# Patient Record
Sex: Male | Born: 1955 | Race: White | Hispanic: No | Marital: Married | Smoking: Former smoker
Health system: Southern US, Community
[De-identification: ages and names within clinical notes are randomized; demographics above are authoritative.]

## PROBLEM LIST (undated history)

## (undated) DIAGNOSIS — Z87442 Personal history of urinary calculi: Secondary | ICD-10-CM

## (undated) DIAGNOSIS — I471 Supraventricular tachycardia, unspecified: Secondary | ICD-10-CM

## (undated) DIAGNOSIS — R519 Headache, unspecified: Secondary | ICD-10-CM

## (undated) DIAGNOSIS — Z8719 Personal history of other diseases of the digestive system: Secondary | ICD-10-CM

## (undated) DIAGNOSIS — M069 Rheumatoid arthritis, unspecified: Secondary | ICD-10-CM

## (undated) DIAGNOSIS — K219 Gastro-esophageal reflux disease without esophagitis: Secondary | ICD-10-CM

## (undated) DIAGNOSIS — I10 Essential (primary) hypertension: Secondary | ICD-10-CM

## (undated) DIAGNOSIS — I712 Thoracic aortic aneurysm, without rupture: Secondary | ICD-10-CM

## (undated) DIAGNOSIS — I7121 Aneurysm of the ascending aorta, without rupture: Secondary | ICD-10-CM

## (undated) DIAGNOSIS — Z96651 Presence of right artificial knee joint: Secondary | ICD-10-CM

## (undated) DIAGNOSIS — C801 Malignant (primary) neoplasm, unspecified: Secondary | ICD-10-CM

## (undated) HISTORY — PX: CARDIAC CATHETERIZATION: SHX172

## (undated) HISTORY — PX: STRABISMUS SURGERY: SHX218

## (undated) HISTORY — PX: TONSILLECTOMY: SUR1361

## (undated) HISTORY — PX: CYSTOSCOPY W/ URETEROSCOPY W/ LITHOTRIPSY: SUR380

---

## 2008-08-10 HISTORY — PX: OTHER SURGICAL HISTORY: SHX169

## 2012-10-08 HISTORY — PX: CATARACT EXTRACTION W/ INTRAOCULAR LENS IMPLANT: SHX1309

## 2015-01-09 HISTORY — PX: KNEE ARTHROSCOPY W/ MENISCECTOMY: SHX1879

## 2015-06-11 DIAGNOSIS — I712 Thoracic aortic aneurysm, without rupture: Secondary | ICD-10-CM

## 2015-06-11 DIAGNOSIS — I7121 Aneurysm of the ascending aorta, without rupture: Secondary | ICD-10-CM | POA: Insufficient documentation

## 2015-06-11 DIAGNOSIS — I10 Essential (primary) hypertension: Secondary | ICD-10-CM

## 2015-06-11 DIAGNOSIS — R002 Palpitations: Secondary | ICD-10-CM | POA: Insufficient documentation

## 2015-06-11 HISTORY — DX: Aneurysm of the ascending aorta, without rupture: I71.21

## 2015-06-11 HISTORY — DX: Palpitations: R00.2

## 2015-06-11 HISTORY — DX: Essential (primary) hypertension: I10

## 2015-06-11 HISTORY — DX: Thoracic aortic aneurysm, without rupture: I71.2

## 2015-06-20 HISTORY — PX: REPLACEMENT UNICONDYLAR JOINT KNEE: SUR1227

## 2015-08-06 ENCOUNTER — Encounter (HOSPITAL_BASED_OUTPATIENT_CLINIC_OR_DEPARTMENT_OTHER): Payer: Self-pay | Admitting: *Deleted

## 2015-08-06 NOTE — Progress Notes (Signed)
NPO AFTER MN WITH EXCEPTION CLEAR LIQUIDS UNTIL 0700 (NO CREAM/ MILK PRODUCTS).  ARRIVE AT 1115.  NEEDS ISTAT AND EKG. WILL TAKE GABAPENTIN AND ATENOLOL AM DOS W/ SIPS OF WATER AND IF NEEDED TAKE NORCO.

## 2015-08-08 ENCOUNTER — Encounter (HOSPITAL_BASED_OUTPATIENT_CLINIC_OR_DEPARTMENT_OTHER): Payer: Self-pay | Admitting: *Deleted

## 2015-08-08 ENCOUNTER — Ambulatory Visit (HOSPITAL_BASED_OUTPATIENT_CLINIC_OR_DEPARTMENT_OTHER): Payer: Managed Care, Other (non HMO) | Admitting: Anesthesiology

## 2015-08-08 ENCOUNTER — Encounter (HOSPITAL_BASED_OUTPATIENT_CLINIC_OR_DEPARTMENT_OTHER)
Admission: RE | Disposition: A | Discharge status: Home / Self Care | Payer: Self-pay | Source: Ambulatory Visit | Attending: Orthopedic Surgery

## 2015-08-08 ENCOUNTER — Ambulatory Visit (HOSPITAL_BASED_OUTPATIENT_CLINIC_OR_DEPARTMENT_OTHER)
Admission: RE | Admit: 2015-08-08 | Discharge: 2015-08-08 | Disposition: A | Discharge status: Home / Self Care | Payer: Managed Care, Other (non HMO) | Source: Ambulatory Visit | Attending: Orthopedic Surgery | Admitting: Orthopedic Surgery

## 2015-08-08 DIAGNOSIS — T84022A Instability of internal right knee prosthesis, initial encounter: Secondary | ICD-10-CM | POA: Insufficient documentation

## 2015-08-08 DIAGNOSIS — Z96651 Presence of right artificial knee joint: Secondary | ICD-10-CM

## 2015-08-08 DIAGNOSIS — M069 Rheumatoid arthritis, unspecified: Secondary | ICD-10-CM | POA: Diagnosis not present

## 2015-08-08 DIAGNOSIS — Z885 Allergy status to narcotic agent status: Secondary | ICD-10-CM | POA: Diagnosis not present

## 2015-08-08 DIAGNOSIS — I10 Essential (primary) hypertension: Secondary | ICD-10-CM | POA: Insufficient documentation

## 2015-08-08 DIAGNOSIS — Y792 Prosthetic and other implants, materials and accessory orthopedic devices associated with adverse incidents: Secondary | ICD-10-CM | POA: Diagnosis not present

## 2015-08-08 DIAGNOSIS — Z87891 Personal history of nicotine dependence: Secondary | ICD-10-CM | POA: Diagnosis not present

## 2015-08-08 HISTORY — DX: Rheumatoid arthritis, unspecified: M06.9

## 2015-08-08 HISTORY — DX: Presence of right artificial knee joint: Z96.651

## 2015-08-08 HISTORY — PX: PARTIAL KNEE ARTHROPLASTY: SHX2174

## 2015-08-08 HISTORY — DX: Essential (primary) hypertension: I10

## 2015-08-08 LAB — POCT I-STAT, CHEM 8
BUN: 18 mg/dL (ref 6–20)
CREATININE: 0.9 mg/dL (ref 0.61–1.24)
Calcium, Ion: 1.12 mmol/L (ref 1.12–1.23)
Chloride: 100 mmol/L — ABNORMAL LOW (ref 101–111)
Glucose, Bld: 83 mg/dL (ref 65–99)
HEMATOCRIT: 41 % (ref 39.0–52.0)
HEMOGLOBIN: 13.9 g/dL (ref 13.0–17.0)
POTASSIUM: 3.8 mmol/L (ref 3.5–5.1)
SODIUM: 139 mmol/L (ref 135–145)
TCO2: 28 mmol/L (ref 0–100)

## 2015-08-08 SURGERY — ARTHROPLASTY, KNEE, UNICOMPARTMENTAL
Anesthesia: General | Site: Knee | Laterality: Right

## 2015-08-08 MED ORDER — MIDAZOLAM HCL 2 MG/2ML IJ SOLN
INTRAMUSCULAR | Status: AC
  Filled 2015-08-08: qty 2

## 2015-08-08 MED ORDER — LACTATED RINGERS IV SOLN
INTRAVENOUS | Status: DC
Start: 2015-08-08 — End: 2015-08-08
  Administered 2015-08-08 (×2): via INTRAVENOUS
  Filled 2015-08-08: qty 1000

## 2015-08-08 MED ORDER — LIDOCAINE HCL (CARDIAC) 20 MG/ML IV SOLN
INTRAVENOUS | Status: DC | PRN
  Administered 2015-08-08: 100 mg via INTRAVENOUS

## 2015-08-08 MED ORDER — METHOCARBAMOL 500 MG PO TABS: 500.0000 mg | ORAL_TABLET | Freq: Four times a day (QID) | ORAL | Status: DC | PRN

## 2015-08-08 MED ORDER — ONDANSETRON HCL 4 MG/2ML IJ SOLN
INTRAMUSCULAR | Status: AC
  Filled 2015-08-08: qty 2

## 2015-08-08 MED ORDER — FENTANYL CITRATE (PF) 100 MCG/2ML IJ SOLN
INTRAMUSCULAR | Status: AC
  Filled 2015-08-08: qty 2

## 2015-08-08 MED ORDER — CEFAZOLIN SODIUM-DEXTROSE 2-3 GM-% IV SOLR
2.0000 g | INTRAVENOUS | Status: AC
  Administered 2015-08-08: 2 g via INTRAVENOUS
  Filled 2015-08-08: qty 50

## 2015-08-08 MED ORDER — KETOROLAC TROMETHAMINE 30 MG/ML IJ SOLN
INTRAMUSCULAR | Status: DC | PRN
  Administered 2015-08-08: 30 mg via INTRAVENOUS

## 2015-08-08 MED ORDER — SODIUM CHLORIDE 0.9 % IR SOLN
Status: DC | PRN
  Administered 2015-08-08: 500 mL

## 2015-08-08 MED ORDER — LIDOCAINE-PRILOCAINE 2.5-2.5 % EX CREA
TOPICAL_CREAM | CUTANEOUS | Status: AC
  Filled 2015-08-08: qty 5

## 2015-08-08 MED ORDER — ONDANSETRON HCL 4 MG/2ML IJ SOLN
INTRAMUSCULAR | Status: DC | PRN
  Administered 2015-08-08: 4 mg via INTRAVENOUS

## 2015-08-08 MED ORDER — PROPOFOL 10 MG/ML IV BOLUS
INTRAVENOUS | Status: DC | PRN
  Administered 2015-08-08: 250 mg via INTRAVENOUS

## 2015-08-08 MED ORDER — CEPHALEXIN 500 MG PO CAPS: 500.0000 mg | ORAL_CAPSULE | Freq: Three times a day (TID) | ORAL | Status: DC

## 2015-08-08 MED ORDER — FENTANYL CITRATE (PF) 100 MCG/2ML IJ SOLN
INTRAMUSCULAR | Status: DC | PRN
  Administered 2015-08-08 (×4): 25 ug via INTRAVENOUS

## 2015-08-08 MED ORDER — HYDROCODONE-ACETAMINOPHEN 7.5-325 MG PO TABS
1.0000 | ORAL_TABLET | Freq: Once | ORAL | Status: AC
  Administered 2015-08-08: 1 via ORAL
  Filled 2015-08-08: qty 1

## 2015-08-08 MED ORDER — CHLORHEXIDINE GLUCONATE 4 % EX LIQD
60.0000 mL | Freq: Once | CUTANEOUS | Status: DC
  Filled 2015-08-08: qty 60

## 2015-08-08 MED ORDER — DEXAMETHASONE SODIUM PHOSPHATE 10 MG/ML IJ SOLN
INTRAMUSCULAR | Status: AC
  Filled 2015-08-08: qty 1

## 2015-08-08 MED ORDER — LACTATED RINGERS IV SOLN
INTRAVENOUS | Status: DC
  Filled 2015-08-08: qty 1000

## 2015-08-08 MED ORDER — PROPOFOL 10 MG/ML IV BOLUS
INTRAVENOUS | Status: AC
  Filled 2015-08-08: qty 20

## 2015-08-08 MED ORDER — ASPIRIN EC 325 MG PO TBEC: 325.0000 mg | DELAYED_RELEASE_TABLET | Freq: Two times a day (BID) | ORAL | Status: AC

## 2015-08-08 MED ORDER — DEXAMETHASONE SODIUM PHOSPHATE 10 MG/ML IJ SOLN
INTRAMUSCULAR | Status: DC | PRN
  Administered 2015-08-08: 10 mg via INTRAVENOUS

## 2015-08-08 MED ORDER — FENTANYL CITRATE (PF) 100 MCG/2ML IJ SOLN
25.0000 ug | INTRAMUSCULAR | Status: DC | PRN
  Administered 2015-08-08 (×3): 50 ug via INTRAVENOUS
  Filled 2015-08-08: qty 1

## 2015-08-08 MED ORDER — METHOCARBAMOL 500 MG PO TABS
ORAL_TABLET | ORAL | Status: AC
  Filled 2015-08-08: qty 1

## 2015-08-08 MED ORDER — CEFAZOLIN SODIUM-DEXTROSE 2-3 GM-% IV SOLR
INTRAVENOUS | Status: AC
  Filled 2015-08-08: qty 50

## 2015-08-08 MED ORDER — BUPIVACAINE-EPINEPHRINE (PF) 0.25% -1:200000 IJ SOLN
INTRAMUSCULAR | Status: AC
  Filled 2015-08-08: qty 30

## 2015-08-08 MED ORDER — METHOCARBAMOL 500 MG PO TABS
500.0000 mg | ORAL_TABLET | Freq: Once | ORAL | Status: AC
  Administered 2015-08-08: 500 mg via ORAL
  Filled 2015-08-08: qty 1

## 2015-08-08 MED ORDER — LIDOCAINE HCL (CARDIAC) 20 MG/ML IV SOLN
INTRAVENOUS | Status: AC
  Filled 2015-08-08: qty 5

## 2015-08-08 MED ORDER — HYDROCODONE-ACETAMINOPHEN 7.5-325 MG PO TABS
ORAL_TABLET | ORAL | Status: AC
  Filled 2015-08-08: qty 1

## 2015-08-08 MED ORDER — HYDROCODONE-ACETAMINOPHEN 7.5-325 MG PO TABS: 1.0000 | ORAL_TABLET | ORAL | Status: DC | PRN

## 2015-08-08 MED ORDER — MIDAZOLAM HCL 5 MG/5ML IJ SOLN
INTRAMUSCULAR | Status: DC | PRN
  Administered 2015-08-08: 2 mg via INTRAVENOUS

## 2015-08-08 SURGICAL SUPPLY — 41 items
BAG DECANTER FOR FLEXI CONT (MISCELLANEOUS) IMPLANT
BAG ZIPLOCK 12X15 (MISCELLANEOUS) IMPLANT
BANDAGE ACE 6X5 VEL STRL LF (GAUZE/BANDAGES/DRESSINGS) ×2 IMPLANT
BLADE SAW RECIPROCATING 77.5 (BLADE) IMPLANT
BLADE SAW SGTL 13.0X1.19X90.0M (BLADE) IMPLANT
BOWL SMART MIX CTS (DISPOSABLE) IMPLANT
CLOTH BEACON ORANGE TIMEOUT ST (SAFETY) ×2 IMPLANT
CUFF TOURN SGL QUICK 34 (TOURNIQUET CUFF) ×1
CUFF TRNQT CYL 34X4X40X1 (TOURNIQUET CUFF) ×1 IMPLANT
DRAPE U-SHAPE 47X51 STRL (DRAPES) ×2 IMPLANT
DRSG AQUACEL AG ADV 3.5X10 (GAUZE/BANDAGES/DRESSINGS) ×2 IMPLANT
DURAPREP 26ML APPLICATOR (WOUND CARE) ×4 IMPLANT
ELECT REM PT RETURN 9FT ADLT (ELECTROSURGICAL) ×2
ELECTRODE REM PT RTRN 9FT ADLT (ELECTROSURGICAL) ×1 IMPLANT
GLOVE BIOGEL M 7.0 STRL (GLOVE) IMPLANT
GLOVE BIOGEL PI IND STRL 7.5 (GLOVE) ×1 IMPLANT
GLOVE BIOGEL PI IND STRL 8.5 (GLOVE) ×1 IMPLANT
GLOVE BIOGEL PI INDICATOR 7.5 (GLOVE) ×1
GLOVE BIOGEL PI INDICATOR 8.5 (GLOVE) ×1
GLOVE ECLIPSE 8.0 STRL XLNG CF (GLOVE) ×2 IMPLANT
GLOVE ORTHO TXT STRL SZ7.5 (GLOVE) ×4 IMPLANT
GOWN STRL REUS W/TWL LRG LVL3 (GOWN DISPOSABLE) ×2 IMPLANT
GOWN STRL REUS W/TWL XL LVL3 (GOWN DISPOSABLE) ×2 IMPLANT
LEGGING LITHOTOMY PAIR STRL (DRAPES) ×2 IMPLANT
LIQUID BAND (GAUZE/BANDAGES/DRESSINGS) ×2 IMPLANT
MANIFOLD NEPTUNE II (INSTRUMENTS) ×2 IMPLANT
Oxford Partial Knee System Anatomic Meniscal Beari (Orthopedic Implant) ×2 IMPLANT
PACK TOTAL KNEE CUSTOM (KITS) ×2 IMPLANT
STERILE TOTAL KNEE PACK ×2 IMPLANT
STRATAFIX ×2 IMPLANT
SUT MNCRL AB 3-0 PS2 18 (SUTURE) ×2 IMPLANT
SUT MNCRL AB 4-0 PS2 18 (SUTURE) ×2 IMPLANT
SUT STRATAFIX 1PDS 45CM VIOLET (SUTURE) ×2 IMPLANT
SUT VIC AB 1 CT1 36 (SUTURE) ×2 IMPLANT
SUT VIC AB 2-0 CT1 (SUTURE) ×4 IMPLANT
SUT VIC AB 2-0 CT1 27 (SUTURE)
SUT VIC AB 2-0 CT1 TAPERPNT 27 (SUTURE) IMPLANT
SUT VLOC 180 0 24IN GS25 (SUTURE) ×2 IMPLANT
SYR 50ML LL SCALE MARK (SYRINGE) IMPLANT
TRAY FOLEY W/METER SILVER 14FR (SET/KITS/TRAYS/PACK) IMPLANT
TRAY FOLEY W/METER SILVER 16FR (SET/KITS/TRAYS/PACK) IMPLANT

## 2015-08-08 NOTE — Transfer of Care (Signed)
Immediate Anesthesia Transfer of Care Note  Patient: Luis Wilkinson  Procedure(s) Performed: Procedure(s) (LRB): RIGHT UNICOMPARTMENTAL KNEE REVISION PLASTIC (Right)  Patient Location: PACU  Anesthesia Type: General  Level of Consciousness: awake, sedated, patient cooperative and responds to stimulation  Airway & Oxygen Therapy: Patient Spontanous Breathing and Patient connected to face mask oxygen  Post-op Assessment: Report given to PACU RN, Post -op Vital signs reviewed and stable and Patient moving all extremities  Post vital signs: Reviewed and stable  Complications: No apparent anesthesia complications

## 2015-08-08 NOTE — H&P (Signed)
Luis Wilkinson is an 59 y.o. male.    Chief Complaint: Right knee pain   HPI: Pt is a 59 y.o. male patient of mine now about 7 weeks out from his right partial knee arthroplasty.  He was doing very well and then went to bed on the 26th.  During the night at some point something occurred that resulted in increased pain and inability to bear weight through his knee.  He came to the office to have it evaluated.  He saw Molli Barrows, PA-C and X-rays revealed dislocated polyethylene insert into the supra-patellar pouch.  Based on these findings we discussed and reviewed the necessity of proceeding with revision surgery.  PCP:  Cristine Polio, MD  D/C Plans: To be determined following appropriate treatment plan  PMH: Past Medical History  Diagnosis Date  . Hypertension   . RA (rheumatoid arthritis) ([place])   . S/P right unicompartmental knee replacement     06-20-2015  post dislocating plastic     PSH: Past Surgical History  Procedure Laterality Date  . Strabismus surgery Left x6   last one --Age 72  . Left elbow reconstruction  2010  . 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 Cataract extraction w/ intraocular lens implant Right Mar 2014  . Knee arthroscopy w/ meniscectomy Right 01-09-2015  . Cardiac catheterization  Feb 2015    [place]    per pt normal coronary arteries  . Replacement unicondylar joint knee Right 06-20-2015    Social History:  reports that he quit smoking about 21 years ago. His smoking use included Cigarettes. He quit after 10 years of use. He has never used smokeless tobacco. He reports that he does not drink alcohol or use illicit drugs.  Allergies:  Allergies  Allergen Reactions  . Dilaudid [Hydromorphone] Shortness Of Breath and Other (See Comments)    Chest tight    Medications: Medications Prior to Admission  Medication Sig Dispense Refill  . atenolol (TENORMIN) 100 MG tablet Take 100 mg by mouth every morning.    . celecoxib (CELEBREX)  200 MG capsule Take 200 mg by mouth every morning.    . ergocalciferol (VITAMIN D2) 50000 units capsule Take 100,000 Units by mouth once a week. Friday's    . etanercept (ENBREL) 50 MG/ML injection Inject 50 mg into the skin once a week. Thursday's    . gabapentin (NEURONTIN) 300 MG capsule Take 300 mg by mouth 2 (two) times daily.    . Glucosamine-Chondroit-Vit C-Mn (GLUCOSAMINE 1500 COMPLEX PO) Take 2 capsules by mouth daily.    Marland Kitchen HYDROcodone-acetaminophen (NORCO) 7.5-325 MG tablet Take 1 tablet by mouth every 6 (six) hours as needed for moderate pain.    Marland Kitchen leflunomide (ARAVA) 20 MG tablet Take 20 mg by mouth daily.    Marland Kitchen lisinopril-hydrochlorothiazide (PRINZIDE,ZESTORETIC) 20-12.5 MG tablet Take 1 tablet by mouth every morning.    . loratadine-pseudoephedrine (CLARITIN-D 24-HOUR) 10-240 MG 24 hr tablet Take 1 tablet by mouth daily as needed for allergies.    . Multiple Vitamin (MULTIVITAMIN) tablet Take 1 tablet by mouth every morning.    . predniSONE (DELTASONE) 5 MG tablet Take 5 mg by mouth as directed. Takes PRN when RA flares up    . triazolam (HALCION) 0.25 MG tablet Take 0.25 mg by mouth at bedtime as needed for sleep.      No results found for this or any previous visit (from the past 48 hour(s)). No results found.  ROS: Review of Systems -  Negative except for his surgery 6 weeks ago and the event sof the HPI.  No recent fever chills or night sweats.  No wound complications  Physican Exam: Blood pressure 143/88, pulse 76, temperature 99 F (37.2 C), temperature source Oral, resp. rate 14, height 5\' 10"  (1.778 m), weight 86.183 kg (190 lb), SpO2 99 %.   Awake alert very pleasant and cooperative man Right knee incision healed Some knee warmth, some swelling  Chest clear Abdomen soft Hear regular  O/W NVI RLE   Assessment/Plan Assessment:  Dislocated right polyethylene insert following right partial knee replacement - [employer]  Plan: Patient will undergo an open revision  of the polyethylene insert.  Benefits and expectation were discussed with the patient. Patient understand risks, benefits and expectation and wishes to proceed. Plan to do as an outpatient procedure and have him go home after procedure Reviewed with him and his wife  Pietro Cassis. Alvan Dame, MD  08/08/2015, 12:07 PM

## 2015-08-08 NOTE — Discharge Instructions (Addendum)
INSTRUCTIONS AFTER JOINT REPLACEMENT  ° °o Remove items at home which could result in a fall. This includes throw rugs or furniture in walking pathways °o ICE to the affected joint every three hours while awake for 30 minutes at a time, for at least the first 3-5 days, and then as needed for pain and swelling.  Continue to use ice for pain and swelling. You may notice swelling that will progress down to the foot and ankle.  This is normal after surgery.  Elevate your leg when you are not up walking on it.   °o Continue to use the breathing machine you got in the hospital (incentive spirometer) which will help keep your temperature down.  It is common for your temperature to cycle up and down following surgery, especially at night when you are not up moving around and exerting yourself.  The breathing machine keeps your lungs expanded and your temperature down. ° ° °DIET:  As you were doing prior to hospitalization, we recommend a well-balanced diet. ° °DRESSING / WOUND CARE / SHOWERING ° °Keep the surgical dressing until follow up.  The dressing is water proof, so you can shower without any extra covering.  IF THE DRESSING FALLS OFF or the wound gets wet inside, change the dressing with sterile gauze.  Please use good hand washing techniques before changing the dressing.  Do not use any lotions or creams on the incision until instructed by your surgeon.   ° °ACTIVITY ° °o Increase activity slowly as tolerated, but follow the weight bearing instructions below.   °o No driving for 6 weeks or until further direction given by your physician.  You cannot drive while taking narcotics.  °o No lifting or carrying greater than 10 lbs. until further directed by your surgeon. °o Avoid periods of inactivity such as sitting longer than an hour when not asleep. This helps prevent blood clots.  °o You may return to work once you are authorized by your doctor.  ° ° ° °WEIGHT BEARING  ° °Weight bearing as tolerated with assist  device (walker, cane, etc) as directed, use it as long as suggested by your surgeon or therapist, typically at least 4-6 weeks. ° ° °EXERCISES ° °Results after joint replacement surgery are often greatly improved when you follow the exercise, range of motion and muscle strengthening exercises prescribed by your doctor. Safety measures are also important to protect the joint from further injury. Any time any of these exercises cause you to have increased pain or swelling, decrease what you are doing until you are comfortable again and then slowly increase them. If you have problems or questions, call your caregiver or physical therapist for advice.  ° °Rehabilitation is important following a joint replacement. After just a few days of immobilization, the muscles of the leg can become weakened and shrink (atrophy).  These exercises are designed to build up the tone and strength of the thigh and leg muscles and to improve motion. Often times heat used for twenty to thirty minutes before working out will loosen up your tissues and help with improving the range of motion but do not use heat for the first two weeks following surgery (sometimes heat can increase post-operative swelling).  ° °These exercises can be done on a training (exercise) mat, on the floor, on a table or on a bed. Use whatever works the best and is most comfortable for you.    Use music or television while you are exercising so that   the exercises are a pleasant break in your day. This will make your life better with the exercises acting as a break in your routine that you can look forward to.   Perform all exercises about fifteen times, three times per day or as directed.  You should exercise both the operative leg and the other leg as well. ° °Exercises include: °  °• Quad Sets - Tighten up the muscle on the front of the thigh (Quad) and hold for 5-10 seconds.   °• Straight Leg Raises - With your knee straight (if you were given a brace, keep it on),  lift the leg to 60 degrees, hold for 3 seconds, and slowly lower the leg.  Perform this exercise against resistance later as your leg gets stronger.  °• Leg Slides: Lying on your back, slowly slide your foot toward your buttocks, bending your knee up off the floor (only go as far as is comfortable). Then slowly slide your foot back down until your leg is flat on the floor again.  °• Angel Wings: Lying on your back spread your legs to the side as far apart as you can without causing discomfort.  °• Hamstring Strength:  Lying on your back, push your heel against the floor with your leg straight by tightening up the muscles of your buttocks.  Repeat, but this time bend your knee to a comfortable angle, and push your heel against the floor.  You may put a pillow under the heel to make it more comfortable if necessary.  ° °A rehabilitation program following joint replacement surgery can speed recovery and prevent re-injury in the future due to weakened muscles. Contact your doctor or a physical therapist for more information on knee rehabilitation.  ° ° °CONSTIPATION ° °Constipation is defined medically as fewer than three stools per week and severe constipation as less than one stool per week.  Even if you have a regular bowel pattern at home, your normal regimen is likely to be disrupted due to multiple reasons following surgery.  Combination of anesthesia, postoperative narcotics, change in appetite and fluid intake all can affect your bowels.  ° °YOU MUST use at least one of the following options; they are listed in order of increasing strength to get the job done.  They are all available over the counter, and you may need to use some, POSSIBLY even all of these options:   ° °Drink plenty of fluids (prune juice may be helpful) and high fiber foods °Colace 100 mg by mouth twice a day  °Senokot for constipation as directed and as needed Dulcolax (bisacodyl), take with full glass of water  °Miralax (polyethylene glycol)  once or twice a day as needed. ° °If you have tried all these things and are unable to have a bowel movement in the first 3-4 days after surgery call either your surgeon or your primary doctor.   ° °If you experience loose stools or diarrhea, hold the medications until you stool forms back up.  If your symptoms do not get better within 1 week or if they get worse, check with your doctor.  If you experience "the worst abdominal pain ever" or develop nausea or vomiting, please contact the office immediately for further recommendations for treatment. ° ° °ITCHING:  If you experience itching with your medications, try taking only a single pain pill, or even half a pain pill at a time.  You can also use Benadryl over the counter for itching or also to   help with sleep.   TED HOSE STOCKINGS:  Use stockings on both legs until for at least 2 weeks or as directed by physician office. They may be removed at night for sleeping.  MEDICATIONS:  See your medication summary on the After Visit Summary that nursing will review with you.  You may have some home medications which will be placed on hold until you complete the course of blood thinner medication.  It is important for you to complete the blood thinner medication as prescribed.  PRECAUTIONS:  If you experience chest pain or shortness of breath - call 911 immediately for transfer to the hospital emergency department.   If you develop a fever greater that 101 F, purulent drainage from wound, increased redness or drainage from wound, foul odor from the wound/dressing, or calf pain - CONTACT YOUR SURGEON.                                                   FOLLOW-UP APPOINTMENTS:  If you do not already have a post-op appointment, please call the office for an appointment to be seen by your surgeon.  Guidelines for how soon to be seen are listed in your After Visit Summary, but are typically between 1-4 weeks after surgery.  OTHER INSTRUCTIONS:   Knee  Replacement:  Do not place pillow under knee, focus on keeping the knee straight while resting.   MAKE SURE YOU:   Understand these instructions.   Get help right away if you are not doing well or get worse.    Thank you for letting us be a part of your medical care team.  It is a privilege we respect greatly.  We hope these instructions will help you stay on track for a fast and full recovery!    Post Anesthesia Home Care Instructions  Activity: Get plenty of rest for the remainder of the day. A responsible adult should stay with you for 24 hours following the procedure.  For the next 24 hours, DO NOT: -Drive a car -Paediatric nurse -Drink alcoholic beverages -Take any medication unless instructed by your physician -Make any legal decisions or sign important papers.  Meals: Start with liquid foods such as gelatin or soup. Progress to regular foods as tolerated. Avoid greasy, spicy, heavy foods. If nausea and/or vomiting occur, drink only clear liquids until the nausea and/or vomiting subsides. Call your physician if vomiting continues.  Special Instructions/Symptoms: Your throat may feel dry or sore from the anesthesia or the breathing tube placed in your throat during surgery. If this causes discomfort, gargle with warm salt water. The discomfort should disappear within 24 hours.  If you had a scopolamine patch placed behind your ear for the management of post- operative nausea and/or vomiting:  1. The medication in the patch is effective for 72 hours, after which it should be removed.  Wrap patch in a tissue and discard in the trash. Wash hands thoroughly with soap and water. 2. You may remove the patch earlier than 72 hours if you experience unpleasant side effects which may include dry mouth, dizziness or visual disturbances. 3. Avoid touching the patch. Wash your hands with soap and water after contact with the patch.

## 2015-08-08 NOTE — Anesthesia Preprocedure Evaluation (Signed)
Anesthesia Evaluation  Patient identified by MRN, date of birth, ID band Patient awake    Reviewed: Allergy & Precautions, H&P , NPO status , Patient's Chart, lab work & pertinent test results, reviewed documented beta blocker date and time   Airway Mallampati: II  TM Distance: >3 FB Neck ROM: full    Dental no notable dental hx. (+) Dental Advisory Given, Teeth Intact   Pulmonary neg pulmonary ROS, former smoker,    Pulmonary exam normal breath sounds clear to auscultation       Cardiovascular Exercise Tolerance: Good hypertension, Pt. on home beta blockers and Pt. on medications Normal cardiovascular exam Rhythm:regular Rate:Normal     Neuro/Psych negative neurological ROS  negative psych ROS   GI/Hepatic negative GI ROS, Neg liver ROS,   Endo/Other  negative endocrine ROS  Renal/GU negative Renal ROS  negative genitourinary   Musculoskeletal  (+) Arthritis , Rheumatoid disorders,    Abdominal   Peds  Hematology negative hematology ROS (+)   Anesthesia Other Findings   Reproductive/Obstetrics negative OB ROS                             Anesthesia Physical Anesthesia Plan  ASA: III  Anesthesia Plan: General   Post-op Pain Management:    Induction: Intravenous  Airway Management Planned: LMA  Additional Equipment:   Intra-op Plan:   Post-operative Plan:   Informed Consent: I have reviewed the patients History and Physical, chart, labs and discussed the procedure including the risks, benefits and alternatives for the proposed anesthesia with the patient or authorized representative who has indicated his/her understanding and acceptance.   Dental Advisory Given  Plan Discussed with: CRNA and Surgeon  Anesthesia Plan Comments:         Anesthesia Quick Evaluation

## 2015-08-08 NOTE — Anesthesia Procedure Notes (Signed)
Procedure Name: LMA Insertion Date/Time: 08/08/2015 2:04 PM Performed by: Justice Rocher Pre-anesthesia Checklist: Patient identified, Emergency Drugs available, Suction available and Patient being monitored Patient Re-evaluated:Patient Re-evaluated prior to inductionOxygen Delivery Method: Circle System Utilized Preoxygenation: Pre-oxygenation with 100% oxygen Intubation Type: IV induction Ventilation: Mask ventilation without difficulty LMA: LMA inserted LMA Size: 4.0 Number of attempts: 1 Airway Equipment and Method: Bite block Placement Confirmation: positive ETCO2 Tube secured with: Tape Dental Injury: Teeth and Oropharynx as per pre-operative assessment

## 2015-08-08 NOTE — Brief Op Note (Signed)
08/08/2015  2:58 PM  PATIENT:  Calvert Cantor  59 y.o. 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  PROCEDURE:  Procedure(s): RIGHT UNICOMPARTMENTAL KNEE REVISION polyethylene (small 5 to small right medial 6) (Right)  SURGEON:  Surgeon(s) and Role:    * Paralee Cancel, MD - Primary  PHYSICIAN ASSISTANT: None   ANESTHESIA:   general  EBL:  Total I/O In: 1000 [I.V.:1000] Out: -   BLOOD ADMINISTERED:none  DRAINS: none   LOCAL MEDICATIONS USED:  NONE  SPECIMEN:  No Specimen  DISPOSITION OF SPECIMEN:  N/A  COUNTS:  YES  TOURNIQUET:   Total Tourniquet Time Documented: Thigh (Right) - 25 minutes Total: Thigh (Right) - 25 minutes   DICTATION: .Other Dictation: Dictation Number [id number]  PLAN OF CARE: Discharge to home after PACU  PATIENT DISPOSITION:  PACU - hemodynamically stable.   Delay start of Pharmacological VTE agent (>24hrs) due to surgical blood loss or risk of bleeding: no

## 2015-08-09 NOTE — Op Note (Signed)
NAMEJYREN, ASCHE NO.:  [id number]  MEDICAL RECORD NO.:  [id number]  LOCATION:                                 FACILITY:  PHYSICIAN:  Pietro Cassis. Alvan Dame, M.D.       DATE OF BIRTH:  DATE OF PROCEDURE:  08/08/2015 DATE OF DISCHARGE:                              OPERATIVE REPORT   PREOPERATIVE DIAGNOSIS:  Failed right partial knee arthroplasty with polyethylene dislocation or dislodgement.  POSTOPERATIVE DIAGNOSIS:  Failed right partial knee arthroplasty with polyethylene dislocation or dislodgement.  PROCEDURE:  Revision of right partial knee arthroplasty, removing the old polyethylene and replacing with a size 6 mm polyethylene insert to match the size small femur on this right medial knee.  SURGEON:  Pietro Cassis. Alvan Dame, M.D.  ASSISTANT:  None.  ANESTHESIA:  General.  SPECIMENS:  None.  COMPLICATIONS:  None.  DRAINS:  None.  TOURNIQUET TIME:  25 minutes at 250 mmHg.  INDICATION FOR THE PROCEDURE:  Luis Wilkinson is a very pleasant 59 year old male, who is now 7 weeks out from right partial knee arthroplasty.  He had been doing very well, progressing with physical therapy, and then had an unexplained episode where he had gone to bed 2 nights ago, he woke up in the middle of the night and felt that his pain had increased in his knee during the night and subsequently recognized significant pain with bearing weight.  He was seen in the office on the 27th by Gerrit Halls, where radiographs revealed dislocation of polyethylene and suprapatellar pouch.  He was made nonweightbearing and scheduled for surgery today.  Risks, benefits, and necessity of procedure were discussed with him.  Very difficult for me to explain why this would happen at night.  Consent was obtained for benefit of pain relief as well as improved function.  PROCEDURE IN DETAIL:  The patient was brought to the operative theater. Once adequate anesthesia, preoperative antibiotics, Ancef  administered, he was positioned supine with his left unaffected extremity into a GYN leg holder and his right leg was then fashioned with a proximal thigh tourniquet and allowed to drape over Oxford leg holder to allow for 120 degrees of flexion.  The right lower extremity was then prepped and draped in sterile fashion.  Time-out was performed identifying the patient, planned procedure, and extremity.  As I noted with the patient, I created a little bit slightly larger incision to expose the joint, soft tissue planes created, followed by medial arthrotomy encountering seromatous material.  No signs of infection.  The medial collateral ligament was palpable and noted to be intact.  Old polyethylene was palpable in the suprapatellar pouch and then removed using a Kocher clamp.  Further inspection of the joint did not find any abnormalities in the posterior aspect of the joint or medial.  At this point, I did do some trials and identified that with a 5 mm insert, there was a little bit more play in the knee in extension than I would have anticipated at the index surgery in 20 degrees of flexion, and for this reason, I upsized to a 6 mm insert where there was more stability on the  medial collateral ligament.  Given these findings, I selected the 6 mm insert, opened it and snapped it into place.  The knee was irrigated with normal saline solution.  At this point with the knee held at 45 degrees of flexion, the medial arthrotomy was closed with #1 Vicryl and 0 Quill suture.  The remaining wound was closed with 2-0 Vicryl and running 3-0 Monocryl.  The knee was then cleaned, dried, and dressed sterilely using surgical glue and Aquacel dressing, wrapped in Ace wrap. He was then brought to the recovery room in stable condition tolerating the procedure well.  Findings were reviewed with his wife.  At this point, we will plan to see him back in the office in 2 weeks to review findings.   [employer] offers a guarantee on these implants, there for his insert was not able to be given back to him.     Pietro Cassis Alvan Dame, M.D.     MDO/MEDQ  D:  08/08/2015  T:  08/08/2015  Job:  [id number]

## 2015-08-13 ENCOUNTER — Encounter (HOSPITAL_BASED_OUTPATIENT_CLINIC_OR_DEPARTMENT_OTHER): Payer: Self-pay | Admitting: Orthopedic Surgery

## 2015-08-19 NOTE — Anesthesia Postprocedure Evaluation (Signed)
Anesthesia Post Note  Patient: Luis Wilkinson  Procedure(s) Performed: Procedure(s) (LRB): RIGHT UNICOMPARTMENTAL KNEE REVISION PLASTIC (Right)  Patient location during evaluation: PACU Anesthesia Type: Spinal Level of consciousness: oriented and awake and alert Pain management: pain level controlled Vital Signs Assessment: post-procedure vital signs reviewed and stable Respiratory status: spontaneous breathing, respiratory function stable and patient connected to nasal cannula oxygen Cardiovascular status: blood pressure returned to baseline and stable Postop Assessment: no headache and no backache Anesthetic complications: no    Last Vitals:  Filed Vitals:   08/08/15 1615 08/08/15 1745  BP: 149/100 138/89  Pulse: 71 66  Temp:  37 C  Resp: 11 12    Last Pain:  Filed Vitals:   08/09/15 1111  PainSc: 2                  Montez Hageman

## 2016-02-26 ENCOUNTER — Encounter (HOSPITAL_COMMUNITY): Payer: Managed Care, Other (non HMO)

## 2016-02-26 ENCOUNTER — Other Ambulatory Visit (HOSPITAL_COMMUNITY): Payer: Self-pay | Admitting: Orthopedic Surgery

## 2016-02-26 NOTE — Progress Notes (Signed)
ekg 12/16 epic Chest ct 11/16 chart

## 2016-02-26 NOTE — H&P (Signed)
TOTAL KNEE REVISION ADMISSION H&P  Patient is being admitted for right revision unicompartmental knee arthroplasty.  Subjective:  Chief Complaint:    Right knee pain  HPI: Luis Wilkinson, 60 y.o. 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  The indications for the revision of the unicompartmental knee arthroplasty are displacement of the poly. Onset of symptoms was abrupt starting this morning (02/26/2016) with rapidlly worsening course since that time.  Prior procedures on the left knee(s) include unicompartmental arthroplasty and one previous revision do to the poly displacing.  Patient currently rates pain in the right knee(s) at 8 out of 10 with activity. There is worsening of pain with activity and weight bearing, pain that interferes with activities of daily living, pain with passive range of motion and crepitus.  Patient has evidence of anterior displacement of the poly  by imaging studies. This condition presents safety issues increasing the risk of falls.  There is no current active infection.  Risks, benefits and expectations were discussed with the patient.  Risks including but not limited to the risk of anesthesia, blood clots, nerve damage, blood vessel damage, failure of the prosthesis, infection and up to and including death.  Patient understand the risks, benefits and expectations and wishes to proceed with surgery.   PCP: Cristine Polio, MD  D/C Plans:      Home with HHPT  Post-op Meds:       No Rx given  Tranexamic Acid:      To be given - IV   Decadron:      Is to be given  FYI:     ASA  Norco       Past Medical History  Diagnosis Date  . Hypertension   . RA (rheumatoid arthritis) ([place])   . 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 Strabismus surgery Left x6   last one --Age 66  . Left elbow reconstruction   2010  . 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 Cataract extraction w/ intraocular lens implant Right Mar 2014  . Knee arthroscopy w/ meniscectomy Right 01-09-2015  . Cardiac catheterization  Feb 2015    [place]    per pt normal coronary arteries  . Replacement unicondylar joint knee Right 06-20-2015  . Partial knee arthroplasty Right 08/08/2015    Procedure: RIGHT UNICOMPARTMENTAL KNEE REVISION PLASTIC;  Surgeon: Paralee Cancel, MD;  [facility];  Service: Orthopedics;  Laterality: Right;    No prescriptions prior to admission   Allergies  Allergen Reactions  . Dilaudid [Hydromorphone] Shortness Of Breath and Other (See Comments)    Chest tight    Social History  Substance Use Topics  . Smoking status: Former Smoker -- 10 years    Types: Cigarettes    Quit date: 07/19/1994  . Smokeless tobacco: Never Used  . Alcohol Use: No       Review of Systems  Constitutional: Negative.   HENT: Negative.   Eyes: Negative.   Respiratory: Negative.   Cardiovascular: Negative.   Gastrointestinal: Negative.   Genitourinary: Negative.   Musculoskeletal: Positive for joint pain.  Skin: Negative.   Neurological: Negative.   Endo/Heme/Allergies: Negative.   Psychiatric/Behavioral: Negative.      Objective:  Physical Exam  Constitutional: He is oriented to person, place, and time. He appears  well-developed.  HENT:  Head: Normocephalic.  Eyes: Pupils are equal, round, and reactive to light.  Neck: Neck supple. No JVD present. No tracheal deviation present. No thyromegaly present.  Cardiovascular: Normal rate, regular rhythm, normal heart sounds and intact distal pulses.   Respiratory: Effort normal and breath sounds normal. No stridor. No respiratory distress. He has no wheezes.  GI: Soft. There is no tenderness. There is no guarding.  Musculoskeletal:       Right knee: He exhibits decreased range of motion, laceration (healed previous incision) and  abnormal alignment. He exhibits no effusion, no ecchymosis and no erythema. Tenderness found.  Lymphadenopathy:    He has no cervical adenopathy.  Neurological: He is alert and oriented to person, place, and time.  Skin: Skin is warm and dry.  Psychiatric: He has a normal mood and affect.     Labs:  Estimated body mass index is 27.26 kg/(m^2) as calculated from the following:   Height as of 08/08/15: 5\' 10"  (1.778 m).   Weight as of 08/08/15: 86.183 kg (190 lb).  Imaging Review Plain radiographs demonstrate previous UKR of the right knee with an anterior displaced poly.  The bone quality appears to be good for age and reported activity level.   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   The patient history, physical examination, clinical judgment of the provider and imaging studies are consistent with failure of the right knee, previous unicompartmental knee arthroplasty. Revision of unicompartmental knee arthroplasty is deemed medically necessary. The treatment options were discussed at length. The risks and benefits of revision total knee arthroplasty were presented and reviewed. The risks due to aseptic loosening, infection, stiffness, patella tracking problems, thromboembolic complications and other imponderables were discussed. The patient acknowledged the explanation, agreed to proceed with the plan and consent was signed. Patient is being admitted for inpatient treatment for surgery, pain control, PT, OT, prophylactic antibiotics, VTE prophylaxis, progressive ambulation and ADL's and discharge planning.The patient is planning to be discharged home.      [place] Concetta Guion   PA-C  02/26/2016, 1:23 PM

## 2016-02-26 NOTE — Patient Instructions (Addendum)
Luis Wilkinson  02/26/2016   Your procedure is scheduled on: 03/02/16 Report to [facility] Main  Entrance take [facility]  elevators to 3rd floor to  [place] at  1:05 PM.  Call this number if you have problems the morning of surgery [phone]   Remember: ONLY 1 PERSON MAY GO WITH YOU TO SHORT STAY TO GET  READY MORNING OF [place].  Do not eat food :After Midnight. MAY HAVE CLEAR LIQUIDS Monday MORNING UNTIL 0900  THEN NOTHING BY MOUTH     Take these medicines the morning of surgery with A SIP OF WATER: ATENOLOL, GABAPENTIN MAY TAKE   ROBAXIN, , or CLARITIN D IF NEEDED DO NOT TAKE ANY DIABETIC MEDICATIONS DAY OF YOUR SURGERY                               You may not have any metal on your body including hair pins and              piercings  Do not wear jewelry, make-up, lotions, powders or perfumes, deodorant             Do not wear nail polish.  Do not shave  48 hours prior to surgery.              Men may shave face and neck.   Do not bring valuables to the hospital. [place].  Contacts, dentures or bridgework may not be worn into surgery.  Leave suitcase in the car. After surgery it may be brought to your room.   ___________________________________________             [facility] - Preparing for Surgery Before surgery, you can play an important role.  Because skin is not sterile, your skin needs to be as free of germs as possible.  You can reduce the number of germs on your skin by washing with CHG (chlorahexidine gluconate) soap before surgery.  CHG is an antiseptic cleaner which kills germs and bonds with the skin to continue killing germs even after washing. Please DO NOT use if you have an allergy to CHG or antibacterial soaps.  If your skin becomes reddened/irritated stop using the CHG and inform your nurse when you arrive at Short Stay. Do not shave (including legs and underarms) for at  least 48 hours prior to the first CHG shower.  You may shave your face/neck. Please follow these instructions carefully:  1.  Shower with CHG Soap the night before surgery and the  morning of Surgery.  2.  If you choose to wash your hair, wash your hair first as usual with your  normal  shampoo.  3.  After you shampoo, rinse your hair and body thoroughly to remove the  shampoo.                           4.  Use CHG as you would any other liquid soap.  You can apply chg directly  to the skin and wash                       Gently with a scrungie or clean washcloth.  5.  Apply the CHG Soap to your body ONLY FROM THE NECK DOWN.   Do not use on face/ open                           Wound or open sores. Avoid contact with eyes, ears mouth and genitals (private parts).                       Wash face,  Genitals (private parts) with your normal soap.             6.  Wash thoroughly, paying special attention to the area where your surgery  will be performed.  7.  Thoroughly rinse your body with warm water from the neck down.  8.  DO NOT shower/wash with your normal soap after using and rinsing off  the CHG Soap.                9.  Pat yourself dry with a clean towel.            10.  Wear clean pajamas.            11.  Place clean sheets on your bed the night of your first shower and do not  sleep with pets. Day of Surgery : Do not apply any lotions/deodorants the morning of surgery.  Please wear clean clothes to the hospital/surgery center.  FAILURE TO FOLLOW THESE INSTRUCTIONS MAY RESULT IN THE CANCELLATION OF YOUR SURGERY PATIENT SIGNATURE_________________________________  NURSE SIGNATURE__________________________________  ________________________________________________________________________    CLEAR LIQUID DIET   Foods Allowed                                                                     Foods Excluded  Coffee and tea, regular and decaf                             liquids that you  cannot  Plain Jell-O in any flavor                                             see through such as: Fruit ices (not with fruit pulp)                                     milk, soups, orange juice  Iced Popsicles                                    All solid food Carbonated beverages, regular and diet                                    Cranberry, grape and apple juices Sports drinks like Gatorade Lightly seasoned clear broth or consume(fat free) Sugar, honey syrup  Sample Menu Breakfast  Lunch                                     Supper Cranberry juice                    Beef broth                            Chicken broth Jell-O                                     Grape juice                           Apple juice Coffee or tea                        Jell-O                                      Popsicle                                                Coffee or tea                        Coffee or tea  _____________________________________________________________________    Incentive Spirometer  An incentive spirometer is a tool that can help keep your lungs clear and active. This tool measures how well you are filling your lungs with each breath. Taking long deep breaths may help reverse or decrease the chance of developing breathing (pulmonary) problems (especially infection) following:  A long period of time when you are unable to move or be active. BEFORE THE PROCEDURE   If the spirometer includes an indicator to show your best effort, your nurse or respiratory therapist will set it to a desired goal.  If possible, sit up straight or lean slightly forward. Try not to slouch.  Hold the incentive spirometer in an upright position. INSTRUCTIONS FOR USE   Sit on the edge of your bed if possible, or sit up as far as you can in bed or on a chair.  Hold the incentive spirometer in an upright position.  Breathe out normally.  Place the mouthpiece in your  mouth and seal your lips tightly around it.  Breathe in slowly and as deeply as possible, raising the piston or the ball toward the top of the column.  Hold your breath for 3-5 seconds or for as long as possible. Allow the piston or ball to fall to the bottom of the column.  Remove the mouthpiece from your mouth and breathe out normally.  Rest for a few seconds and repeat Steps 1 through 7 at least 10 times every 1-2 hours when you are awake. Take your time and take a few normal breaths between deep breaths.  The spirometer may include an indicator to show your best effort. Use the indicator as a goal to work toward during each repetition.  After each set of 10 deep breaths,  practice coughing to be sure your lungs are clear. If you have an incision (the cut made at the time of surgery), support your incision when coughing by placing a pillow or rolled up towels firmly against it. Once you are able to get out of bed, walk around indoors and cough well. You may stop using the incentive spirometer when instructed by your caregiver.  RISKS AND COMPLICATIONS  Take your time so you do not get dizzy or light-headed.  If you are in pain, you may need to take or ask for pain medication before doing incentive spirometry. It is harder to take a deep breath if you are having pain. AFTER USE  Rest and breathe slowly and easily.  It can be helpful to keep track of a log of your progress. Your caregiver can provide you with a simple table to help with this. If you are using the spirometer at home, follow these instructions: [place] IF:   You are having difficultly using the spirometer.  You have trouble using the spirometer as often as instructed.  Your pain medication is not giving enough relief while using the spirometer.  You develop fever of 100.5 F (38.1 C) or higher. SEEK IMMEDIATE MEDICAL CARE IF:   You cough up bloody sputum that had not been present before.  You develop  fever of 102 F (38.9 C) or greater.  You develop worsening pain at or near the incision site. MAKE SURE YOU:   Understand these instructions.  Will watch your condition.  Will get help right away if you are not doing well or get worse. Document Released: 12/07/2006 Document Revised: 10/19/2011 Document Reviewed: 02/07/2007 Exit[employer] Patient Information 2014 Exit[employer], [place].   ________________________________________________________________________  WHAT IS A BLOOD TRANSFUSION? Blood Transfusion Information  A transfusion is the replacement of blood or some of its parts. Blood is made up of multiple cells which provide different functions.  Red blood cells carry oxygen and are used for blood loss replacement.  White blood cells fight against infection.  Platelets control bleeding.  Plasma helps clot blood.  Other blood products are available for specialized needs, such as hemophilia or other clotting disorders. BEFORE THE TRANSFUSION  Who gives blood for transfusions?   Healthy volunteers who are fully evaluated to make sure their blood is safe. This is blood bank blood. Transfusion therapy is the safest it has ever been in the practice of medicine. Before blood is taken from a donor, a complete history is taken to make sure that person has no history of diseases nor engages in risky social behavior (examples are intravenous drug use or sexual activity with multiple partners). The donor's travel history is screened to minimize risk of transmitting infections, such as malaria. The donated blood is tested for signs of infectious diseases, such as HIV and hepatitis. The blood is then tested to be sure it is compatible with you in order to minimize the chance of a transfusion reaction. If you or a relative donates blood, this is often done in anticipation of surgery and is not appropriate for emergency situations. It takes many days to process the donated blood. RISKS AND  COMPLICATIONS Although transfusion therapy is very safe and saves many lives, the main dangers of transfusion include:   Getting an infectious disease.  Developing a transfusion reaction. This is an allergic reaction to something in the blood you were given. Every precaution is taken to prevent this. The decision to have a blood transfusion has been  considered carefully by your caregiver before blood is given. Blood is not given unless the benefits outweigh the risks. AFTER THE TRANSFUSION  Right after receiving a blood transfusion, you will usually feel much better and more energetic. This is especially true if your red blood cells have gotten low (anemic). The transfusion raises the level of the red blood cells which carry oxygen, and this usually causes an energy increase.  The nurse administering the transfusion will monitor you carefully for complications. HOME CARE INSTRUCTIONS  No special instructions are needed after a transfusion. You may find your energy is better. Speak with your caregiver about any limitations on activity for underlying diseases you may have. SEEK MEDICAL CARE IF:   Your condition is not improving after your transfusion.  You develop redness or irritation at the intravenous (IV) site. SEEK IMMEDIATE MEDICAL CARE IF:  Any of the following symptoms occur over the next 12 hours:  Shaking chills.  You have a temperature by mouth above 102 F (38.9 C), not controlled by medicine.  Chest, back, or muscle pain.  People around you feel you are not acting correctly or are confused.  Shortness of breath or difficulty breathing.  Dizziness and fainting.  You get a rash or develop hives.  You have a decrease in urine output.  Your urine turns a dark color or changes to pink, red, or brown. Any of the following symptoms occur over the next 10 days:  You have a temperature by mouth above 102 F (38.9 C), not controlled by medicine.  Shortness of  breath.  Weakness after normal activity.  The white part of the eye turns yellow (jaundice).  You have a decrease in the amount of urine or are urinating less often.  Your urine turns a dark color or changes to pink, red, or brown. Document Released: 07/24/2000 Document Revised: 10/19/2011 Document Reviewed: 03/12/2008 [facility] Patient Information 2014 [place], [place].  _______________________________________________________________________

## 2016-02-26 NOTE — Progress Notes (Signed)
Matt--- sorry to ask- this patient is coming for pre op in AM-  Thursday 0800 and we need orders please. Thanks

## 2016-02-27 ENCOUNTER — Encounter (HOSPITAL_COMMUNITY)
Admission: RE | Admit: 2016-02-27 | Discharge: 2016-02-27 | Disposition: A | Discharge status: Home / Self Care | Payer: Managed Care, Other (non HMO) | Source: Ambulatory Visit | Attending: Orthopedic Surgery | Admitting: Orthopedic Surgery

## 2016-02-27 ENCOUNTER — Encounter (HOSPITAL_COMMUNITY): Payer: Self-pay

## 2016-02-27 DIAGNOSIS — Z0183 Encounter for blood typing: Secondary | ICD-10-CM | POA: Diagnosis not present

## 2016-02-27 DIAGNOSIS — T8489XA Other specified complication of internal orthopedic prosthetic devices, implants and grafts, initial encounter: Secondary | ICD-10-CM | POA: Diagnosis not present

## 2016-02-27 DIAGNOSIS — I10 Essential (primary) hypertension: Secondary | ICD-10-CM | POA: Diagnosis not present

## 2016-02-27 DIAGNOSIS — Z87891 Personal history of nicotine dependence: Secondary | ICD-10-CM | POA: Insufficient documentation

## 2016-02-27 DIAGNOSIS — Y838 Other surgical procedures as the cause of abnormal reaction of the patient, or of later complication, without mention of misadventure at the time of the procedure: Secondary | ICD-10-CM | POA: Insufficient documentation

## 2016-02-27 DIAGNOSIS — Z885 Allergy status to narcotic agent status: Secondary | ICD-10-CM | POA: Insufficient documentation

## 2016-02-27 DIAGNOSIS — Z01812 Encounter for preprocedural laboratory examination: Secondary | ICD-10-CM | POA: Diagnosis present

## 2016-02-27 DIAGNOSIS — M069 Rheumatoid arthritis, unspecified: Secondary | ICD-10-CM | POA: Diagnosis not present

## 2016-02-27 HISTORY — DX: Personal history of urinary calculi: Z87.442

## 2016-02-27 HISTORY — DX: Thoracic aortic aneurysm, without rupture: I71.2

## 2016-02-27 HISTORY — DX: Aneurysm of the ascending aorta, without rupture: I71.21

## 2016-02-27 LAB — BASIC METABOLIC PANEL
ANION GAP: 6 (ref 5–15)
BUN: 17 mg/dL (ref 6–20)
CHLORIDE: 102 mmol/L (ref 101–111)
CO2: 29 mmol/L (ref 22–32)
Calcium: 9.2 mg/dL (ref 8.9–10.3)
Creatinine, Ser: 1.03 mg/dL (ref 0.61–1.24)
GFR calc non Af Amer: >60 mL/min (ref >60)
Glucose, Bld: 94 mg/dL (ref 65–99)
POTASSIUM: 4.4 mmol/L (ref 3.5–5.1)
SODIUM: 137 mmol/L (ref 135–145)

## 2016-02-27 LAB — CBC
HCT: 42.6 % (ref 39.0–52.0)
HEMOGLOBIN: 14.3 g/dL (ref 13.0–17.0)
MCH: 31.8 pg (ref 26.0–34.0)
MCHC: 33.6 g/dL (ref 30.0–36.0)
MCV: 94.7 fL (ref 78.0–100.0)
PLATELETS: 214 10*3/uL (ref 150–400)
RBC: 4.5 MIL/uL (ref 4.22–5.81)
RDW: 12.5 % (ref 11.5–15.5)
WBC: 5.2 10*3/uL (ref 4.0–10.5)

## 2016-02-27 LAB — ABO/RH: ABO/RH(D): O POS

## 2016-02-27 LAB — SURGICAL PCR SCREEN
MRSA, PCR: NEGATIVE
Staphylococcus aureus: POSITIVE — AB

## 2016-02-27 NOTE — Progress Notes (Signed)
lov cardio k ritsche 11/16 chart with ekg Cath 1/15 chart CTA chest with clearance for knee surgery9 Dated for 11/16 surgery)  On chart 10/2005

## 2016-03-02 ENCOUNTER — Observation Stay (HOSPITAL_COMMUNITY)
Admission: RE | Admit: 2016-03-02 | Discharge: 2016-03-03 | Disposition: A | Discharge status: Home / Self Care | Payer: Managed Care, Other (non HMO) | Source: Ambulatory Visit | Attending: Orthopedic Surgery | Admitting: Orthopedic Surgery

## 2016-03-02 ENCOUNTER — Ambulatory Visit (HOSPITAL_COMMUNITY): Payer: Managed Care, Other (non HMO) | Admitting: Anesthesiology

## 2016-03-02 ENCOUNTER — Encounter (HOSPITAL_COMMUNITY)
Admission: RE | Disposition: A | Discharge status: Home / Self Care | Payer: Self-pay | Source: Ambulatory Visit | Attending: Orthopedic Surgery

## 2016-03-02 ENCOUNTER — Encounter (HOSPITAL_COMMUNITY): Payer: Self-pay | Admitting: *Deleted

## 2016-03-02 DIAGNOSIS — Y792 Prosthetic and other implants, materials and accessory orthopedic devices associated with adverse incidents: Secondary | ICD-10-CM | POA: Insufficient documentation

## 2016-03-02 DIAGNOSIS — Z87891 Personal history of nicotine dependence: Secondary | ICD-10-CM | POA: Insufficient documentation

## 2016-03-02 DIAGNOSIS — M25561 Pain in right knee: Secondary | ICD-10-CM | POA: Diagnosis present

## 2016-03-02 DIAGNOSIS — I739 Peripheral vascular disease, unspecified: Secondary | ICD-10-CM | POA: Insufficient documentation

## 2016-03-02 DIAGNOSIS — M1990 Unspecified osteoarthritis, unspecified site: Secondary | ICD-10-CM | POA: Diagnosis not present

## 2016-03-02 DIAGNOSIS — M069 Rheumatoid arthritis, unspecified: Secondary | ICD-10-CM | POA: Diagnosis not present

## 2016-03-02 DIAGNOSIS — T84022A Instability of internal right knee prosthesis, initial encounter: Secondary | ICD-10-CM | POA: Diagnosis not present

## 2016-03-02 DIAGNOSIS — Z96651 Presence of right artificial knee joint: Secondary | ICD-10-CM

## 2016-03-02 DIAGNOSIS — I10 Essential (primary) hypertension: Secondary | ICD-10-CM | POA: Insufficient documentation

## 2016-03-02 HISTORY — PX: PARTIAL KNEE ARTHROPLASTY: SHX2174

## 2016-03-02 HISTORY — DX: Presence of right artificial knee joint: Z96.651

## 2016-03-02 LAB — TYPE AND SCREEN
ABO/RH(D): O POS
Antibody Screen: NEGATIVE

## 2016-03-02 SURGERY — ARTHROPLASTY, KNEE, UNICOMPARTMENTAL
Anesthesia: Monitor Anesthesia Care | Site: Knee | Laterality: Right

## 2016-03-02 MED ORDER — LEFLUNOMIDE 20 MG PO TABS
20.0000 mg | ORAL_TABLET | Freq: Every day | ORAL | Status: DC
  Administered 2016-03-03: 20 mg via ORAL
  Filled 2016-03-02: qty 1

## 2016-03-02 MED ORDER — BUPIVACAINE-EPINEPHRINE 0.25% -1:200000 IJ SOLN
INTRAMUSCULAR | Status: AC
  Filled 2016-03-02: qty 1

## 2016-03-02 MED ORDER — ALUM & MAG HYDROXIDE-SIMETH 200-200-20 MG/5ML PO SUSP: 30.0000 mL | ORAL | Status: DC | PRN

## 2016-03-02 MED ORDER — OXYCODONE HCL 5 MG/5ML PO SOLN
5.0000 mg | Freq: Once | ORAL | Status: DC | PRN
  Filled 2016-03-02: qty 5

## 2016-03-02 MED ORDER — TRANEXAMIC ACID 1000 MG/10ML IV SOLN
1000.0000 mg | Freq: Once | INTRAVENOUS | Status: AC
  Administered 2016-03-02: 1000 mg via INTRAVENOUS
  Filled 2016-03-02: qty 10

## 2016-03-02 MED ORDER — DEXAMETHASONE SODIUM PHOSPHATE 10 MG/ML IJ SOLN: 10.0000 mg | Freq: Once | INTRAMUSCULAR | Status: DC

## 2016-03-02 MED ORDER — SODIUM CHLORIDE 0.9 % IV SOLN
INTRAVENOUS | Status: DC | PRN
  Administered 2016-03-02: 25 ug/min via INTRAVENOUS

## 2016-03-02 MED ORDER — LORATADINE-PSEUDOEPHEDRINE ER 10-240 MG PO TB24: 1.0000 | ORAL_TABLET | Freq: Every day | ORAL | Status: DC | PRN

## 2016-03-02 MED ORDER — PSEUDOEPHEDRINE HCL ER 120 MG PO TB12
120.0000 mg | ORAL_TABLET | Freq: Two times a day (BID) | ORAL | Status: DC | PRN
  Filled 2016-03-02: qty 1

## 2016-03-02 MED ORDER — BISACODYL 10 MG RE SUPP: 10.0000 mg | Freq: Every day | RECTAL | Status: DC | PRN

## 2016-03-02 MED ORDER — ATORVASTATIN CALCIUM 10 MG PO TABS
10.0000 mg | ORAL_TABLET | Freq: Every day | ORAL | Status: DC
  Administered 2016-03-03: 10 mg via ORAL
  Filled 2016-03-02: qty 1

## 2016-03-02 MED ORDER — DIPHENHYDRAMINE HCL 25 MG PO CAPS
25.0000 mg | ORAL_CAPSULE | Freq: Four times a day (QID) | ORAL | Status: DC | PRN
  Administered 2016-03-03: 25 mg via ORAL
  Filled 2016-03-02: qty 1

## 2016-03-02 MED ORDER — BUPIVACAINE IN DEXTROSE 0.75-8.25 % IT SOLN
INTRATHECAL | Status: DC | PRN
  Administered 2016-03-02: 2 mL via INTRATHECAL

## 2016-03-02 MED ORDER — KETOROLAC TROMETHAMINE 30 MG/ML IJ SOLN
INTRAMUSCULAR | Status: DC | PRN
  Administered 2016-03-02: 30 mg

## 2016-03-02 MED ORDER — LACTATED RINGERS IV SOLN
INTRAVENOUS | Status: DC
  Administered 2016-03-02: 19:00:00 via INTRAVENOUS
  Administered 2016-03-02: 1000 mL via INTRAVENOUS
  Administered 2016-03-02: 17:00:00 via INTRAVENOUS

## 2016-03-02 MED ORDER — ONDANSETRON HCL 4 MG PO TABS: 4.0000 mg | ORAL_TABLET | Freq: Four times a day (QID) | ORAL | Status: DC | PRN

## 2016-03-02 MED ORDER — SODIUM CHLORIDE 0.9 % IJ SOLN
INTRAMUSCULAR | Status: DC | PRN
  Administered 2016-03-02: 30 mL

## 2016-03-02 MED ORDER — METOCLOPRAMIDE HCL 5 MG PO TABS: 5.0000 mg | ORAL_TABLET | Freq: Three times a day (TID) | ORAL | Status: DC | PRN

## 2016-03-02 MED ORDER — POLYETHYLENE GLYCOL 3350 17 G PO PACK
17.0000 g | PACK | Freq: Two times a day (BID) | ORAL | Status: DC
  Administered 2016-03-03: 17 g via ORAL
  Filled 2016-03-02: qty 1

## 2016-03-02 MED ORDER — METHOCARBAMOL 500 MG PO TABS
500.0000 mg | ORAL_TABLET | Freq: Four times a day (QID) | ORAL | Status: DC | PRN
  Administered 2016-03-03: 500 mg via ORAL
  Filled 2016-03-02: qty 1

## 2016-03-02 MED ORDER — MORPHINE SULFATE (PF) 2 MG/ML IV SOLN
INTRAVENOUS | Status: AC
  Administered 2016-03-02: 2 mg via INTRAMUSCULAR
  Filled 2016-03-02: qty 1

## 2016-03-02 MED ORDER — SODIUM CHLORIDE 0.9 % IV SOLN
INTRAVENOUS | Status: DC
  Administered 2016-03-02: 21:00:00 via INTRAVENOUS

## 2016-03-02 MED ORDER — METHOCARBAMOL 1000 MG/10ML IJ SOLN
500.0000 mg | Freq: Four times a day (QID) | INTRAVENOUS | Status: DC | PRN
  Administered 2016-03-02: 500 mg via INTRAVENOUS
  Filled 2016-03-02 (×2): qty 5

## 2016-03-02 MED ORDER — ONDANSETRON HCL 4 MG/2ML IJ SOLN: 4.0000 mg | Freq: Four times a day (QID) | INTRAMUSCULAR | Status: DC | PRN

## 2016-03-02 MED ORDER — DOCUSATE SODIUM 100 MG PO CAPS
100.0000 mg | ORAL_CAPSULE | Freq: Two times a day (BID) | ORAL | Status: DC
  Administered 2016-03-02 – 2016-03-03 (×2): 100 mg via ORAL
  Filled 2016-03-02 (×2): qty 1

## 2016-03-02 MED ORDER — TRANEXAMIC ACID 1000 MG/10ML IV SOLN
1000.0000 mg | INTRAVENOUS | Status: AC
  Administered 2016-03-02: 1000 mg via INTRAVENOUS
  Filled 2016-03-02: qty 10

## 2016-03-02 MED ORDER — FENTANYL CITRATE (PF) 100 MCG/2ML IJ SOLN
INTRAMUSCULAR | Status: AC
  Filled 2016-03-02: qty 2

## 2016-03-02 MED ORDER — SODIUM CHLORIDE 0.9 % IJ SOLN
INTRAMUSCULAR | Status: AC
  Filled 2016-03-02: qty 50

## 2016-03-02 MED ORDER — MAGNESIUM CITRATE PO SOLN: 1.0000 | Freq: Once | ORAL | Status: DC | PRN

## 2016-03-02 MED ORDER — CEFAZOLIN SODIUM-DEXTROSE 2-4 GM/100ML-% IV SOLN
INTRAVENOUS | Status: AC
  Filled 2016-03-02: qty 100

## 2016-03-02 MED ORDER — DEXAMETHASONE SODIUM PHOSPHATE 10 MG/ML IJ SOLN
10.0000 mg | Freq: Once | INTRAMUSCULAR | Status: AC
  Administered 2016-03-02: 10 mg via INTRAVENOUS

## 2016-03-02 MED ORDER — ATENOLOL 25 MG PO TABS
100.0000 mg | ORAL_TABLET | Freq: Every morning | ORAL | Status: DC
  Administered 2016-03-03: 100 mg via ORAL
  Filled 2016-03-02: qty 4

## 2016-03-02 MED ORDER — FENTANYL CITRATE (PF) 100 MCG/2ML IJ SOLN
25.0000 ug | INTRAMUSCULAR | Status: DC | PRN
  Administered 2016-03-02 (×3): 50 ug via INTRAVENOUS

## 2016-03-02 MED ORDER — FENTANYL CITRATE (PF) 100 MCG/2ML IJ SOLN
INTRAMUSCULAR | Status: AC
  Administered 2016-03-02: 50 ug via INTRAVENOUS
  Filled 2016-03-02: qty 2

## 2016-03-02 MED ORDER — CEFAZOLIN SODIUM-DEXTROSE 2-4 GM/100ML-% IV SOLN
2.0000 g | INTRAVENOUS | Status: AC
  Administered 2016-03-02: 2 g via INTRAVENOUS
  Filled 2016-03-02: qty 100

## 2016-03-02 MED ORDER — PROPOFOL 10 MG/ML IV BOLUS
INTRAVENOUS | Status: AC
  Filled 2016-03-02: qty 40

## 2016-03-02 MED ORDER — TRIAZOLAM 0.25 MG PO TABS
0.2500 mg | ORAL_TABLET | Freq: Every day | ORAL | Status: DC
  Administered 2016-03-02: 0.25 mg via ORAL

## 2016-03-02 MED ORDER — DEXAMETHASONE SODIUM PHOSPHATE 10 MG/ML IJ SOLN
INTRAMUSCULAR | Status: AC
  Filled 2016-03-02: qty 1

## 2016-03-02 MED ORDER — CELECOXIB 200 MG PO CAPS
200.0000 mg | ORAL_CAPSULE | Freq: Two times a day (BID) | ORAL | Status: DC
  Administered 2016-03-02: 200 mg via ORAL
  Filled 2016-03-02 (×2): qty 1

## 2016-03-02 MED ORDER — LORATADINE 10 MG PO TABS: 5.0000 mg | ORAL_TABLET | Freq: Two times a day (BID) | ORAL | Status: DC | PRN

## 2016-03-02 MED ORDER — FERROUS SULFATE 325 (65 FE) MG PO TABS
325.0000 mg | ORAL_TABLET | Freq: Three times a day (TID) | ORAL | Status: DC
  Administered 2016-03-03: 325 mg via ORAL
  Filled 2016-03-02: qty 1

## 2016-03-02 MED ORDER — PROPOFOL 10 MG/ML IV BOLUS
INTRAVENOUS | Status: AC
  Filled 2016-03-02: qty 20

## 2016-03-02 MED ORDER — METOCLOPRAMIDE HCL 5 MG/ML IJ SOLN: 5.0000 mg | Freq: Three times a day (TID) | INTRAMUSCULAR | Status: DC | PRN

## 2016-03-02 MED ORDER — LIDOCAINE HCL (CARDIAC) 20 MG/ML IV SOLN
INTRAVENOUS | Status: DC | PRN
  Administered 2016-03-02: 80 mg via INTRAVENOUS

## 2016-03-02 MED ORDER — KETOROLAC TROMETHAMINE 30 MG/ML IJ SOLN
INTRAMUSCULAR | Status: AC
  Filled 2016-03-02: qty 1

## 2016-03-02 MED ORDER — MIDAZOLAM HCL 5 MG/5ML IJ SOLN
INTRAMUSCULAR | Status: DC | PRN
  Administered 2016-03-02 (×2): 1 mg via INTRAVENOUS

## 2016-03-02 MED ORDER — BUPIVACAINE-EPINEPHRINE 0.25% -1:200000 IJ SOLN
INTRAMUSCULAR | Status: DC | PRN
  Administered 2016-03-02: 30 mL

## 2016-03-02 MED ORDER — ASPIRIN 81 MG PO CHEW
81.0000 mg | CHEWABLE_TABLET | Freq: Two times a day (BID) | ORAL | Status: DC
  Administered 2016-03-03: 81 mg via ORAL
  Filled 2016-03-02: qty 1

## 2016-03-02 MED ORDER — MIDAZOLAM HCL 2 MG/2ML IJ SOLN
INTRAMUSCULAR | Status: AC
  Filled 2016-03-02: qty 2

## 2016-03-02 MED ORDER — PHENYLEPHRINE HCL 10 MG/ML IJ SOLN
INTRAMUSCULAR | Status: AC
  Filled 2016-03-02: qty 1

## 2016-03-02 MED ORDER — HYDROCODONE-ACETAMINOPHEN 7.5-325 MG PO TABS
1.0000 | ORAL_TABLET | ORAL | Status: DC
  Administered 2016-03-02 – 2016-03-03 (×5): 2 via ORAL
  Filled 2016-03-02 (×5): qty 2

## 2016-03-02 MED ORDER — GABAPENTIN 300 MG PO CAPS
600.0000 mg | ORAL_CAPSULE | Freq: Two times a day (BID) | ORAL | Status: DC
  Administered 2016-03-02 – 2016-03-03 (×2): 600 mg via ORAL
  Filled 2016-03-02 (×2): qty 2

## 2016-03-02 MED ORDER — PHENOL 1.4 % MT LIQD
1.0000 | OROMUCOSAL | Status: DC | PRN
  Filled 2016-03-02: qty 177

## 2016-03-02 MED ORDER — MENTHOL 3 MG MT LOZG: 1.0000 | LOZENGE | OROMUCOSAL | Status: DC | PRN

## 2016-03-02 MED ORDER — ONDANSETRON HCL 4 MG/2ML IJ SOLN
INTRAMUSCULAR | Status: DC | PRN
  Administered 2016-03-02: 4 mg via INTRAVENOUS

## 2016-03-02 MED ORDER — CEFAZOLIN SODIUM-DEXTROSE 2-4 GM/100ML-% IV SOLN
2.0000 g | Freq: Four times a day (QID) | INTRAVENOUS | Status: AC
  Administered 2016-03-02 – 2016-03-03 (×2): 2 g via INTRAVENOUS
  Filled 2016-03-02 (×2): qty 100

## 2016-03-02 MED ORDER — MORPHINE SULFATE (PF) 2 MG/ML IV SOLN
1.0000 mg | INTRAVENOUS | Status: DC | PRN
  Administered 2016-03-03: 2 mg via INTRAVENOUS
  Filled 2016-03-02: qty 1

## 2016-03-02 MED ORDER — OXYCODONE HCL 5 MG PO TABS: 5.0000 mg | ORAL_TABLET | Freq: Once | ORAL | Status: DC | PRN

## 2016-03-02 MED ORDER — PROPOFOL 500 MG/50ML IV EMUL
INTRAVENOUS | Status: DC | PRN
  Administered 2016-03-02: 100 ug/kg/min via INTRAVENOUS

## 2016-03-02 MED ORDER — FENTANYL CITRATE (PF) 100 MCG/2ML IJ SOLN
INTRAMUSCULAR | Status: DC | PRN
  Administered 2016-03-02 (×2): 50 ug via INTRAVENOUS

## 2016-03-02 MED ORDER — ONDANSETRON HCL 4 MG/2ML IJ SOLN
INTRAMUSCULAR | Status: AC
  Filled 2016-03-02: qty 2

## 2016-03-02 SURGICAL SUPPLY — 38 items
BAG DECANTER FOR FLEXI CONT (MISCELLANEOUS) IMPLANT
BAG ZIPLOCK 12X15 (MISCELLANEOUS) IMPLANT
BANDAGE ACE 6X5 VEL STRL LF (GAUZE/BANDAGES/DRESSINGS) ×2 IMPLANT
BEARING TIBIAL VANGUARD ST 7 (Orthopedic Implant) ×2 IMPLANT
BLADE SAW RECIPROCATING 77.5 (BLADE) ×2 IMPLANT
BLADE SAW SGTL 13.0X1.19X90.0M (BLADE) ×2 IMPLANT
BOWL SMART MIX CTS (DISPOSABLE) ×2 IMPLANT
CEMENT HV SMART SET (Cement) ×2 IMPLANT
CLOTH BEACON ORANGE TIMEOUT ST (SAFETY) ×2 IMPLANT
CUFF TOURN SGL QUICK 34 (TOURNIQUET CUFF) ×1
CUFF TRNQT CYL 34X4X40X1 (TOURNIQUET CUFF) ×1 IMPLANT
DRAPE U-SHAPE 47X51 STRL (DRAPES) ×2 IMPLANT
DRSG AQUACEL AG ADV 3.5X10 (GAUZE/BANDAGES/DRESSINGS) ×2 IMPLANT
DURAPREP 26ML APPLICATOR (WOUND CARE) ×2 IMPLANT
ELECT REM PT RETURN 9FT ADLT (ELECTROSURGICAL) ×2
ELECTRODE REM PT RTRN 9FT ADLT (ELECTROSURGICAL) ×1 IMPLANT
GLOVE BIOGEL M 7.0 STRL (GLOVE) IMPLANT
GLOVE BIOGEL PI IND STRL 7.5 (GLOVE) ×1 IMPLANT
GLOVE BIOGEL PI IND STRL 8.5 (GLOVE) ×1 IMPLANT
GLOVE BIOGEL PI INDICATOR 7.5 (GLOVE) ×1
GLOVE BIOGEL PI INDICATOR 8.5 (GLOVE) ×1
GLOVE ECLIPSE 8.0 STRL XLNG CF (GLOVE) ×4 IMPLANT
GLOVE ORTHO TXT STRL SZ7.5 (GLOVE) ×6 IMPLANT
GOWN STRL REUS W/TWL LRG LVL3 (GOWN DISPOSABLE) ×2 IMPLANT
GOWN STRL REUS W/TWL XL LVL3 (GOWN DISPOSABLE) ×2 IMPLANT
LEGGING LITHOTOMY PAIR STRL (DRAPES) ×2 IMPLANT
LIQUID BAND (GAUZE/BANDAGES/DRESSINGS) ×2 IMPLANT
MANIFOLD NEPTUNE II (INSTRUMENTS) ×2 IMPLANT
PACK TOTAL KNEE CUSTOM (KITS) ×2 IMPLANT
SUT MNCRL AB 4-0 PS2 18 (SUTURE) ×2 IMPLANT
SUT VIC AB 1 CT1 36 (SUTURE) ×2 IMPLANT
SUT VIC AB 2-0 CT1 27 (SUTURE) ×2
SUT VIC AB 2-0 CT1 TAPERPNT 27 (SUTURE) ×2 IMPLANT
SUT VLOC 180 0 24IN GS25 (SUTURE) ×2 IMPLANT
SYR 50ML LL SCALE MARK (SYRINGE) ×2 IMPLANT
TRAY FOLEY W/METER SILVER 14FR (SET/KITS/TRAYS/PACK) IMPLANT
TRAY FOLEY W/METER SILVER 16FR (SET/KITS/TRAYS/PACK) ×2 IMPLANT
WRAP KNEE MAXI GEL POST OP (GAUZE/BANDAGES/DRESSINGS) ×2 IMPLANT

## 2016-03-02 NOTE — Interval H&P Note (Signed)
History and Physical Interval Note:  03/02/2016 2:58 PM  Luis Wilkinson  has presented today for surgery, with the diagnosis of dislocated poly on right uni-knee  The various methods of treatment have been discussed with the patient and family. After consideration of risks, benefits and other options for treatment, the patient has consented to  Procedure(s): UNICOMPARTMENTAL RIGHT KNEE REVISION (Right) as a surgical intervention .  The patient's history has been reviewed, patient examined, no change in status, stable for surgery.  I have reviewed the patient's chart and labs.  Questions were answered to the patient's satisfaction.     Mauri Pole

## 2016-03-02 NOTE — Anesthesia Postprocedure Evaluation (Signed)
Anesthesia Post Note  Patient: Luis Wilkinson  Procedure(s) Performed: Procedure(s) (LRB): UNICOMPARTMENTAL RIGHT KNEE REVISION (Right)  Patient location during evaluation: PACU Anesthesia Type: Spinal Level of consciousness: oriented and awake and alert Pain management: pain level controlled Vital Signs Assessment: post-procedure vital signs reviewed and stable Respiratory status: spontaneous breathing, respiratory function stable and patient connected to nasal cannula oxygen Cardiovascular status: blood pressure returned to baseline and stable Postop Assessment: no headache and no backache Anesthetic complications: no    Last Vitals:  Vitals:   03/02/16 1900 03/02/16 1930  BP: (!) 101/91 135/85  Pulse: 61 (!) 58  Resp: 18 15  Temp:  36.6 C    Last Pain:  Vitals:   03/02/16 1930  TempSrc:   PainSc: 4     LLE Motor Response: Purposeful movement (03/02/16 1930) LLE Sensation: Full sensation (03/02/16 1930) RLE Motor Response: Purposeful movement (03/02/16 1930) RLE Sensation: Full sensation (03/02/16 1930) L Sensory Level: S1-Sole of foot, small toes (03/02/16 1930) R Sensory Level: S1-Sole of foot, small toes (03/02/16 1930)  [place]

## 2016-03-02 NOTE — Transfer of Care (Signed)
Immediate Anesthesia Transfer of Care Note  Patient: Luis Wilkinson  Procedure(s) Performed: Procedure(s): UNICOMPARTMENTAL RIGHT KNEE REVISION (Right)  Patient Location: PACU  Anesthesia Type:Spinal  Level of Consciousness:  sedated, patient cooperative and responds to stimulation  Airway & Oxygen Therapy:Patient Spontanous Breathing and Patient connected to face mask oxgen  Post-op Assessment:  Report given to PACU RN and Post -op Vital signs reviewed and stable  Post vital signs:  Reviewed and stable  Last Vitals:  Vitals:   03/02/16 1305  BP: (!) 147/92  Pulse: 83  Resp: 18  Temp: 37 C    Complications: No apparent anesthesia complications

## 2016-03-02 NOTE — Op Note (Signed)
[name], [name] NO.:  [id number]  MEDICAL RECORD NO.:  [id number]  LOCATION:  1602                         FACILITY:  [facility]  PHYSICIAN:  Pietro Cassis. Alvan Dame, M.D.  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  PROCEDURE:  Revision right partial knee arthroplasty to a fixed-bearing tibial tray with a size right medial B 7-mm thick insert.  SURGEON:  Pietro Cassis. Alvan Dame, M.D.  ASSISTANT:  Danae Orleans, PA-C.  Note that Luis Wilkinson was present for the entirety of the case from preoperative position, perioperative management of the operative extremity, general facilitation of the case and primary wound closure.  ANESTHESIA:  Spinal.  SPECIMENS:  None.  COMPLICATIONS:  None.  TOURNIQUET TIME:  24 minutes at 250 mmHg.  DRAINS:  None.  BLOOD LOSS:  Minimal.  INDICATION FOR PROCEDURE:  Luis Wilkinson is a very pleasant 60 year old male with a history of right partial knee arthroplasty by myself with an Oxford knee system.  He had an early postoperative dislocation of this polyethylene and had done well over the past 6 months until while in bed at sleep he woke up with his knee hurting and unable to bear weight. Radiographs revealed a dislocation of the poly again.  This is very difficult and challenging to explain at this point based on the mechanism of injury as described.  After lengthy discussion with Luis Wilkinson about options of maintaining the Oxford knee system versus converting to a fixed-bearing versus a total knee arthroplasty, he wished to maintain the medial compartment arthroplasty option.  We were able to procure [employer]ard  implants for this partial knee to remove the tibial tray and cement and a fixed-bearing component.  He wished to proceed in this fashion.  We reviewed the risks of infection and DVT in this setting.  The chances of polyethylene dislocation should be non-existent.  Potential need for conversion to total knee arthroplasty reviewed.  Consent obtained.  PROCEDURE IN DETAIL:  The patient was brought to the operative theater. Once adequate anesthesia, preoperative antibiotics, Ancef administered as well as 1 g of tranexamic acid and 10 mg of Decadron, he was positioned supine with a right thigh tourniquet placed.  The right lower extremity was then positioned to allow flexion to 120 degrees on the Oxford leg holder.  At this point, the right lower extremity was prepped and draped in sterile fashion.  Time-out was performed identifying the patient, planned procedure and extremity.  The leg was exsanguinated and tourniquet elevated to 250 mmHg.  The patient's old incision was excised and extended it proximal and distal for exposure purposes.  A median arthrotomy was then made encountering clear synovial fluid. The polyethylene insert had migrated the suprapatellar pouch, which he had recognized.  This was removed.  At this point, following exposure, I used a thin ACL saw and undermined the prosthetic cement interface to the  keel portion anteriorly and along the medial side to protect the medial collateral ligament.  I then used an osteotome and elevated the tibial tray without loss of bone.  I then used a standard, thin oscillating saw to resect the remaining bone cement interface, thus removing just a very small amount of bone.  I then removed the cement from the keel portion using a 0.25-inch osteotome.  This purposely widened this keel to allow further cement interdigitation.  At this point, a trial with fixed-bearing implants available and selected the size 7 insert.  The final right  medial B 7 insert was then opened.  The knee was irrigated with normal saline solution and final debridements.  Cement was mixed.  The final component was then cemented in place and held into position at 45 degrees.  As much as I could see around the two-thirds of the implant, there was no visualized loose cement.  Once the cement had cured, we irrigated the knee again, then reapproximated the extensor mechanism using #1 Vicryl and 0 V-Loc sutures.  The remainder of the wound was closed with 2-0 Vicryl and a running 3-0 Monocryl.  The knee was then cleaned, dried and dressed sterilely using surgical glue and Aquacel dressing.  He will be admitted for observation overnight and discharge the next day.  Findings were reviewed with family.  He will be weightbearing as tolerated.     Pietro Cassis Alvan Dame, M.D.     MDO/MEDQ  D:  03/02/2016  T:  03/02/2016  Job:  [id number]

## 2016-03-02 NOTE — Anesthesia Procedure Notes (Signed)
Spinal  Patient location during procedure: OR End time: 03/02/2016 3:36 PM Staffing Anesthesiologist: HODIERNE, ADAM Resident/CRNA: PETERS, LAURA J Performed: resident/CRNA  Preanesthetic Checklist Completed: patient identified, site marked, surgical consent, pre-op evaluation, timeout performed, IV checked, risks and benefits discussed and monitors and equipment checked Spinal Block Patient position: sitting Prep: Betadine Patient monitoring: heart rate, continuous pulse ox and blood pressure Location: L3-4 Injection technique: single-shot Needle Needle type: Sprotte  Needle gauge: 24 G Needle length: 9 cm Additional Notes Expiration date of kit checked and confirmed. Patient tolerated procedure well, without complications. Single shot. LOT#[id number]. Expiration date 07-09-2017.       

## 2016-03-02 NOTE — Discharge Instructions (Signed)

## 2016-03-02 NOTE — Anesthesia Preprocedure Evaluation (Signed)
Anesthesia Evaluation  Patient identified by MRN, date of birth, ID band Patient awake    Reviewed: Allergy & Precautions, NPO status , Patient's Chart, lab work & pertinent test results  Airway Mallampati: II   Neck ROM: full    Dental   Pulmonary former smoker,    breath sounds clear to auscultation       Cardiovascular hypertension, + Peripheral Vascular Disease   Rhythm:regular Rate:Normal     Neuro/Psych    GI/Hepatic   Endo/Other    Renal/GU      Musculoskeletal  (+) Arthritis ,   Abdominal   Peds  Hematology   Anesthesia Other Findings   Reproductive/Obstetrics                             Anesthesia Physical Anesthesia Plan  ASA: II  Anesthesia Plan: MAC and Spinal   Post-op Pain Management:    Induction: Intravenous  Airway Management Planned: Simple Face Mask  Additional Equipment:   Intra-op Plan:   Post-operative Plan:   Informed Consent: I have reviewed the patients History and Physical, chart, labs and discussed the procedure including the risks, benefits and alternatives for the proposed anesthesia with the patient or authorized representative who has indicated his/her understanding and acceptance.     Plan Discussed with: CRNA, Anesthesiologist and Surgeon  Anesthesia Plan Comments:         Anesthesia Quick Evaluation

## 2016-03-02 NOTE — Brief Op Note (Signed)
03/02/2016  4:48 PM  PATIENT:  Luis Wilkinson  60 y.o. male  PRE-OPERATIVE DIAGNOSIS:  Failed right partial knee with dislocated polyethylene on right uni-knee  POST-OPERATIVE DIAGNOSIS:  Failed right partial knee with dislocated polyethylene on right uni-knee  PROCEDURE:  Procedure(s): UNICOMPARTMENTAL RIGHT KNEE REVISION (Right)  SURGEON:  Surgeon(s) and Role:    * Paralee Cancel, MD - Primary  PHYSICIAN ASSISTANT: Danae Orleans, PA-C   ANESTHESIA:   spinal  EBL:  Total I/O In: 1000 [I.V.:1000] Out: 275 [Urine:225; Blood:50]  BLOOD ADMINISTERED:none  DRAINS: none   LOCAL MEDICATIONS USED:  MARCAINE     SPECIMEN:  No Specimen  DISPOSITION OF SPECIMEN:  N/A  COUNTS:  YES  TOURNIQUET:   Total Tourniquet Time Documented: Thigh (laterality) - 24 minutes Total: Thigh (laterality) - 24 minutes   DICTATION: .Other Dictation: Dictation Number [id number]  PLAN OF CARE: Admit for overnight observation  PATIENT DISPOSITION:  PACU - hemodynamically stable.   Delay start of Pharmacological VTE agent (>24hrs) due to surgical blood loss or risk of bleeding: no

## 2016-03-03 ENCOUNTER — Encounter (HOSPITAL_COMMUNITY): Payer: Self-pay | Admitting: Orthopedic Surgery

## 2016-03-03 DIAGNOSIS — T84022A Instability of internal right knee prosthesis, initial encounter: Secondary | ICD-10-CM | POA: Diagnosis not present

## 2016-03-03 DIAGNOSIS — Z96651 Presence of right artificial knee joint: Secondary | ICD-10-CM

## 2016-03-03 HISTORY — DX: Presence of right artificial knee joint: Z96.651

## 2016-03-03 LAB — CBC
HEMATOCRIT: 38.6 % — AB (ref 39.0–52.0)
HEMOGLOBIN: 13.1 g/dL (ref 13.0–17.0)
MCH: 31.9 pg (ref 26.0–34.0)
MCHC: 33.9 g/dL (ref 30.0–36.0)
MCV: 93.9 fL (ref 78.0–100.0)
Platelets: 201 10*3/uL (ref 150–400)
RBC: 4.11 MIL/uL — AB (ref 4.22–5.81)
RDW: 12.5 % (ref 11.5–15.5)
WBC: 9.3 10*3/uL (ref 4.0–10.5)

## 2016-03-03 LAB — BASIC METABOLIC PANEL
ANION GAP: 7 (ref 5–15)
BUN: 12 mg/dL (ref 6–20)
CHLORIDE: 105 mmol/L (ref 101–111)
CO2: 25 mmol/L (ref 22–32)
Calcium: 8.5 mg/dL — ABNORMAL LOW (ref 8.9–10.3)
Creatinine, Ser: 0.9 mg/dL (ref 0.61–1.24)
GFR calc Af Amer: >60 mL/min (ref >60)
GFR calc non Af Amer: >60 mL/min (ref >60)
GLUCOSE: 227 mg/dL — AB (ref 65–99)
POTASSIUM: 4.3 mmol/L (ref 3.5–5.1)
Sodium: 137 mmol/L (ref 135–145)

## 2016-03-03 MED ORDER — ASPIRIN 81 MG PO CHEW: 81.0000 mg | CHEWABLE_TABLET | Freq: Two times a day (BID) | ORAL | 0 refills | Status: AC

## 2016-03-03 MED ORDER — ACTEMRA 162 MG/0.9ML ~~LOC~~ SOSY: 162.0000 mg | PREFILLED_SYRINGE | SUBCUTANEOUS | Status: DC

## 2016-03-03 MED ORDER — FERROUS SULFATE 325 (65 FE) MG PO TABS: 325.0000 mg | ORAL_TABLET | Freq: Three times a day (TID) | ORAL | 3 refills | Status: DC

## 2016-03-03 MED ORDER — POLYETHYLENE GLYCOL 3350 17 G PO PACK: 17.0000 g | PACK | Freq: Two times a day (BID) | ORAL | 0 refills | Status: DC

## 2016-03-03 MED ORDER — METHOCARBAMOL 500 MG PO TABS: 500.0000 mg | ORAL_TABLET | Freq: Four times a day (QID) | ORAL | 0 refills | Status: DC | PRN

## 2016-03-03 MED ORDER — HYDROCODONE-ACETAMINOPHEN 7.5-325 MG PO TABS: 1.0000 | ORAL_TABLET | ORAL | 0 refills | Status: DC | PRN

## 2016-03-03 MED ORDER — DOCUSATE SODIUM 100 MG PO CAPS: 100.0000 mg | ORAL_CAPSULE | Freq: Two times a day (BID) | ORAL | 0 refills | Status: DC

## 2016-03-03 NOTE — Progress Notes (Signed)
OT Cancellation Note  Patient Details Name: Davies Brakefield MRN: [id number] DOB: 24-Jun-1956   Cancelled Treatment:    Reason Eval/Treat Not Completed: PT screened, no needs identified, will sign off  Gamal Todisco 03/03/2016, 10:06 AM Lesle Chris, OTR/L [phone] 03/03/2016

## 2016-03-03 NOTE — Progress Notes (Signed)
Subjective: 1 Day Post-Op Procedure(s) (LRB): UNICOMPARTMENTAL RIGHT KNEE REVISION (Right)    Patient reports pain as mild.  No events.  Reviewed surgical findings and procedure with him this am  Objective:   VITALS:   Vitals:   03/03/16 0540 03/03/16 0925  BP: 140/77 (!) 137/91  Pulse: 80 80  Resp: 16 16  Temp: 97.7 F (36.5 C) 97.6 F (36.4 C)    Neurovascular intact Incision: dressing C/D/I  LABS  Recent Labs  03/03/16 0411  HGB 13.1  HCT 38.6*  WBC 9.3  PLT 201     Recent Labs  03/03/16 0411  NA 137  K 4.3  BUN 12  CREATININE 0.90  GLUCOSE 227*    No results for input(s): LABPT, INR in the last 72 hours.   Assessment/Plan: 1 Day Post-Op Procedure(s) (LRB): UNICOMPARTMENTAL RIGHT KNEE REVISION (Right)   Up with therapy Discharge home today with plans to go directly to outpatient PT RTC in 2 weeks

## 2016-03-03 NOTE — Care Management Note (Signed)
Case Management Note  Patient Details  Name: Luis Wilkinson MRN: [id number] Date of Birth: 04-May-1956  Subjective/Objective:                   UNICOMPARTMENTAL RIGHT KNEE REVISION (Right) Action/Plan:  Discharge planning Expected Discharge Date:                  Expected Discharge Plan:  Home/Self Care  In-House Referral:     Discharge planning Services  CM Consult  Post Acute Care Choice:  NA Choice offered to:     DME Arranged:  N/A DME Agency:  NA  HH Arranged:    [place] Agency:  NA  Status of Service:  Completed, signed off  If discussed at [place] of Stay Meetings, dates discussed:    Additional Comments: Cm met with pt to confirm plan is for Outpt PT; pt confirms.  Pt states he has DME from previous surgeries and dos not need additional DME.  No other CM needs were communicated. Dellie Catholic, RN 03/03/2016, 11:39 AM

## 2016-03-03 NOTE — Progress Notes (Signed)
Discharge instructions discussed with patient and wife, verbalized understanding and agreement, prescriptions given to patient

## 2016-03-03 NOTE — Discharge Summary (Signed)
Physician Discharge Summary  Patient ID: Luis Wilkinson MRN: [id number] DOB/AGE: [birth year] 59 y.o.  Admit date: 03/02/2016 Discharge date: 03/03/2016   Procedures:  Procedure(s) (LRB): UNICOMPARTMENTAL RIGHT KNEE REVISION (Right)  Attending Physician:  Dr. Paralee Cancel   Admission Diagnoses:   Right knee pain  Discharge Diagnoses:  Principal Problem:   S/P right [place] revision Active Problems:   Status post right unicompartmental knee replacement  Past Medical History:  Diagnosis Date  . Aneurysm, ascending aorta (HCC)    followed br Dr Hortencia Pilar  . History of kidney stones   . Hypertension   . RA (rheumatoid arthritis) ([place])   . S/P right unicompartmental knee replacement    06-20-2015  post dislocating plastic     HPI:    [name], 61 y.o. 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  The indications for the revision of the unicompartmental knee arthroplasty are displacement of the poly. Onset of symptoms was abrupt starting this morning (02/26/2016) with rapidlly worsening course since that time.  Prior procedures on the left knee(s) include unicompartmental arthroplasty and one previous revision do to the poly displacing. Patient currently rates pain in the right knee(s) at 8 out of 10 with activity. There is worsening of pain with activity and weight bearing, pain that interferes with activities of daily living, pain with passive range of motion and crepitus.  Patient has evidence of anterior displacement of the poly  by imaging studies. This condition presents safety issues increasing the risk of falls.  There is no current active infection.  Risks, benefits and expectations were discussed with the patient.  Risks including but not limited to the risk of anesthesia, blood clots, nerve damage, blood vessel damage, failure of the prosthesis, infection and up to and including death.  Patient understand the risks, benefits and  expectations and wishes to proceed with surgery.   PCP: Cristine Polio, MD   Discharged Condition: good  Hospital Course:  Patient underwent the above stated procedure on 03/02/2016. Patient tolerated the procedure well and brought to the recovery room in good condition and subsequently to the floor.  POD #1 BP: 137/91 ; Pulse: 80 ; Temp: 97.6 F (36.4 C) ; Resp: 16 Patient reports pain as mild.  No events.  Reviewed surgical findings and procedure with him this am. Neurovascular intact and incision: dressing C/D/I  LABS  Basename    HGB     13.1  HCT     38.6    Discharge Exam: General appearance: alert, cooperative and no distress Extremities: Homans sign is negative, no sign of DVT, no edema, redness or tenderness in the calves or thighs and no ulcers, gangrene or trophic changes  Disposition: Home with follow up in 2 weeks   Follow-up Information    Mauri Pole, MD. Schedule an appointment as soon as possible for a visit in 2 week(s).   Specialty:  Orthopedic Surgery Contact information: [address] [place] [address] [id number]           Discharge Instructions    Call MD / Call 911    Complete by:  As directed   If you experience chest pain or shortness of breath, CALL 911 and be transported to the hospital emergency room.  If you develope a fever above 101 F, pus (white drainage) or increased drainage or redness at the wound, or calf pain, call your surgeon's office.   Change dressing  Complete by:  As directed   Maintain surgical dressing until follow up in the clinic. If the edges start to pull up, may reinforce with tape. If the dressing is no longer working, may remove and cover with gauze and tape, but must keep the area dry and clean.  Call with any questions or concerns.   Constipation Prevention    Complete by:  As directed   Drink plenty of fluids.  Prune juice may be helpful.  You may use a stool softener, such as Colace  (over the counter) 100 mg twice a day.  Use MiraLax (over the counter) for constipation as needed.   Diet - low sodium heart healthy    Complete by:  As directed   Discharge instructions    Complete by:  As directed   Maintain surgical dressing until follow up in the clinic. If the edges start to pull up, may reinforce with tape. If the dressing is no longer working, may remove and cover with gauze and tape, but must keep the area dry and clean.  Follow up in 2 weeks at [facility]. Call with any questions or concerns.   Increase activity slowly as tolerated    Complete by:  As directed   Weight bearing as tolerated with assist device (walker, cane, etc) as directed, use it as long as suggested by your surgeon or therapist, typically at least 4-6 weeks.   TED hose    Complete by:  As directed   Use stockings (TED hose) for 2 weeks on both leg(s).  You may remove them at night for sleeping.        Medication List    TAKE these medications   ACTEMRA 162 MG/0.9ML Sosy Generic drug:  Tocilizumab Inject 162 mg into the skin every 14 (fourteen) days.   aspirin 81 MG chewable tablet Chew 1 tablet (81 mg total) by mouth 2 (two) times daily. Take for 4 weeks.   atenolol 100 MG tablet Commonly known as:  TENORMIN Take 100 mg by mouth every morning.   atorvastatin 10 MG tablet Commonly known as:  LIPITOR Take 10 mg by mouth daily.   docusate sodium 100 MG capsule Commonly known as:  COLACE Take 1 capsule (100 mg total) by mouth 2 (two) times daily.   ergocalciferol 50000 units capsule Commonly known as:  VITAMIN D2 Take 50,000 Units by mouth every Friday.   ferrous sulfate 325 (65 FE) MG tablet Take 1 tablet (325 mg total) by mouth 3 (three) times daily after meals.   gabapentin 600 MG tablet Commonly known as:  NEURONTIN Take 600 mg by mouth 2 (two) times daily.   GLUCOSAMINE 1500 COMPLEX PO Take 2 capsules by mouth daily.   HYDROcodone-acetaminophen 7.5-325 MG  tablet Commonly known as:  NORCO Take 1-2 tablets by mouth every 4 (four) hours as needed for moderate pain.   leflunomide 20 MG tablet Commonly known as:  ARAVA Take 20 mg by mouth daily.   lisinopril-hydrochlorothiazide 20-12.5 MG tablet Commonly known as:  PRINZIDE,ZESTORETIC Take 1 tablet by mouth every morning.   loratadine-pseudoephedrine 10-240 MG 24 hr tablet Commonly known as:  CLARITIN-D 24-hour Take 1 tablet by mouth daily as needed for allergies.   methocarbamol 500 MG tablet Commonly known as:  ROBAXIN Take 1 tablet (500 mg total) by mouth every 6 (six) hours as needed for muscle spasms.   multivitamin tablet Take 1 tablet by mouth every morning.   polyethylene glycol packet Commonly known as:  MIRALAX / GLYCOLAX Take 17 g by mouth 2 (two) times daily.   predniSONE 5 MG tablet Commonly known as:  DELTASONE Take 5 mg by mouth as needed (For rheumatoid arthritis flare ups.).   triazolam 0.25 MG tablet Commonly known as:  HALCION Take 0.25 mg by mouth at bedtime.   Vitamin B 12 100 MCG Lozg Take 100 mcg by mouth daily.   ZOFRAN 4 MG tablet Generic drug:  ondansetron Take 4 mg by mouth every 8 (eight) hours as needed for nausea or vomiting.        Signed: [place]. Monnie Gudgel   PA-C  03/03/2016, 6:08 PM

## 2016-03-03 NOTE — Evaluation (Signed)
Physical Therapy Evaluation Patient Details Name: Luis Wilkinson MRN: [id number] DOB: [birth year] Today's Date: 03/03/2016   History of Present Illness  revision uni knee replacement  Clinical Impression  The patient tolerated ambulation well with crutches. Plans Dc today. No further acute PT needs    Follow Up Recommendations Outpatient PT (defer to Dr. Alvan Dame for post DC PT venue. Has been to OP in the past)    Equipment Recommendations  None recommended by PT    Recommendations for Other Services       Precautions / Restrictions Precautions Precautions: Knee      Mobility  Bed Mobility Overal bed mobility: Modified Independent                Transfers Overall transfer level: Modified independent Equipment used: Crutches                Ambulation/Gait Ambulation/Gait assistance: Modified independent (Device/Increase time) Ambulation Distance (Feet): 400 Feet Assistive device: Crutches Gait Pattern/deviations: Step-through pattern        Stairs            Wheelchair Mobility    Modified Rankin (Stroke Patients Only)       Balance                                             Pertinent Vitals/Pain Pain Assessment: 0-10 Pain Score: 4  Pain Location: R knee Pain Descriptors / Indicators: Aching;Discomfort Pain Intervention(s): Premedicated before session;Ice applied    Home Living Family/patient expects to be discharged to:: Private residence Living Arrangements: Spouse/significant other Available Help at Discharge: Family Type of Home: House Home Access: Stairs to enter   Technical brewer of Steps: 4 Home Layout: One level Home Equipment: Environmental consultant - 2 wheels;Crutches      Prior Function                 Hand Dominance        Extremity/Trunk Assessment   Upper Extremity Assessment: Overall WFL for tasks assessed           Lower Extremity Assessment: RLE deficits/detail RLE Deficits /  Details: knee flexion 0-90degrees, + SLR    Cervical / Trunk Assessment: Normal  Communication      Cognition Arousal/Alertness: Awake/alert Behavior During Therapy: WFL for tasks assessed/performed Overall Cognitive Status: Within Functional Limits for tasks assessed                      General Comments      Exercises Total Joint Exercises Quad Sets: AROM;Right;10 reps Towel Squeeze: Supine Short Arc Quad: AROM;Right;10 reps;Supine Heel Slides: AROM;Seated;Right;10 reps;Supine Straight Leg Raises: AROM;Right;10 reps;Supine Long Arc Quad: AROM;Right;10 reps;Seated Knee Flexion: AROM;Right;10 reps;Seated      Assessment/Plan    PT Assessment All further PT needs can be met in the next venue of care  PT Diagnosis Difficulty walking;Acute pain   PT Problem List Decreased strength;Decreased range of motion;Decreased activity tolerance;Decreased mobility;Pain  PT Treatment Interventions     PT Goals (Current goals can be found in the Care Plan section) Acute Rehab PT Goals Patient Stated Goal: go home PT Goal Formulation: All assessment and education complete, DC therapy    Frequency     Barriers to discharge        Co-evaluation  End of Session   Activity Tolerance: Patient tolerated treatment well Patient left: in bed;with call bell/phone within reach;with family/visitor present Nurse Communication: Mobility status    Functional Assessment Tool Used: clinical judgement Functional Limitation: Mobility: Walking and moving around Mobility: Walking and Moving Around Current Status (T0177): At least 1 percent but less than 20 percent impaired, limited or restricted Mobility: Walking and Moving Around Goal Status [phone]): At least 1 percent but less than 20 percent impaired, limited or restricted Mobility: Walking and Moving Around Discharge Status [phone]): At least 1 percent but less than 20 percent impaired, limited or restricted     Time: 0915-0942 PT Time Calculation (min) (ACUTE ONLY): 27 min   Charges:   PT Evaluation $PT Eval Low Complexity: 1 Procedure PT Treatments $Gait Training: 8-22 mins   PT G Codes:   PT G-Codes **NOT FOR INPATIENT CLASS** Functional Assessment Tool Used: clinical judgement Functional Limitation: Mobility: Walking and moving around Mobility: Walking and Moving Around Current Status (R0076): At least 1 percent but less than 20 percent impaired, limited or restricted Mobility: Walking and Moving Around Goal Status [phone]): At least 1 percent but less than 20 percent impaired, limited or restricted Mobility: Walking and Moving Around Discharge Status [phone]): At least 1 percent but less than 20 percent impaired, limited or restricted    Claretha Cooper 03/03/2016, 9:49 AM Tresa Endo PT [phone]

## 2016-04-21 DIAGNOSIS — H2512 Age-related nuclear cataract, left eye: Secondary | ICD-10-CM

## 2016-04-21 HISTORY — DX: Age-related nuclear cataract, left eye: H25.12

## 2017-05-04 DIAGNOSIS — IMO0002 Reserved for concepts with insufficient information to code with codable children: Secondary | ICD-10-CM | POA: Insufficient documentation

## 2017-05-04 HISTORY — DX: Reserved for concepts with insufficient information to code with codable children: IMO0002

## 2017-08-10 HISTORY — PX: PARTIAL COLECTOMY: SHX5273

## 2018-09-22 DIAGNOSIS — M25551 Pain in right hip: Secondary | ICD-10-CM | POA: Insufficient documentation

## 2018-09-22 HISTORY — DX: Pain in right hip: M25.551

## 2019-01-05 ENCOUNTER — Other Ambulatory Visit: Payer: Self-pay

## 2019-01-05 ENCOUNTER — Encounter (HOSPITAL_BASED_OUTPATIENT_CLINIC_OR_DEPARTMENT_OTHER): Payer: Self-pay | Admitting: *Deleted

## 2019-01-05 ENCOUNTER — Emergency Department (HOSPITAL_BASED_OUTPATIENT_CLINIC_OR_DEPARTMENT_OTHER)
Admission: EM | Admit: 2019-01-05 | Discharge: 2019-01-05 | Disposition: A | Discharge status: Home / Self Care | Payer: Commercial Indemnity | Attending: Emergency Medicine | Admitting: Emergency Medicine

## 2019-01-05 DIAGNOSIS — M069 Rheumatoid arthritis, unspecified: Secondary | ICD-10-CM | POA: Diagnosis not present

## 2019-01-05 DIAGNOSIS — Z87891 Personal history of nicotine dependence: Secondary | ICD-10-CM | POA: Diagnosis not present

## 2019-01-05 DIAGNOSIS — Z79899 Other long term (current) drug therapy: Secondary | ICD-10-CM | POA: Diagnosis not present

## 2019-01-05 DIAGNOSIS — L270 Generalized skin eruption due to drugs and medicaments taken internally: Secondary | ICD-10-CM | POA: Insufficient documentation

## 2019-01-05 DIAGNOSIS — I10 Essential (primary) hypertension: Secondary | ICD-10-CM | POA: Diagnosis not present

## 2019-01-05 DIAGNOSIS — T378X5A Adverse effect of other specified systemic anti-infectives and antiparasitics, initial encounter: Secondary | ICD-10-CM | POA: Diagnosis not present

## 2019-01-05 DIAGNOSIS — Z96651 Presence of right artificial knee joint: Secondary | ICD-10-CM | POA: Insufficient documentation

## 2019-01-05 MED ORDER — PREDNISONE 20 MG PO TABS: ORAL_TABLET | ORAL | 0 refills | Status: DC

## 2019-01-05 MED ORDER — METHYLPREDNISOLONE SODIUM SUCC 125 MG IJ SOLR
125.0000 mg | Freq: Once | INTRAMUSCULAR | Status: AC
  Administered 2019-01-05: 125 mg via INTRAVENOUS
  Filled 2019-01-05 (×2): qty 2

## 2019-01-05 NOTE — ED Triage Notes (Addendum)
Allergic reaction to new meds  Onset Tuesday w rash  Itching,  Took last med on Monday  Took meds for 1 week before rash broke out  ? Hydroxychloroquine  Sulfate 200 mg

## 2019-01-05 NOTE — Discharge Instructions (Addendum)
1.  Go to your rheumatologist's office on Monday morning for recheck.  Return to the emergency department if you start developing blisters around your eyes, lips, mouth, genitals or worsening general condition.  As discussed, if symptoms of Stevens-Johnson syndrome develop, you will need to be admitted to a tertiary care hospital. 2.  Start taking prednisone as prescribed this evening.

## 2019-01-05 NOTE — ED Provider Notes (Signed)
[facility] EMERGENCY DEPARTMENT Provider Note   CSN: [id number] Arrival date & time: 01/05/19  9211    History   Chief Complaint Chief Complaint  Patient presents with  . Allergic Reaction    HPI Luis Wilkinson is a 63 y.o. male.     HPI Patient has rheumatoid arthritis.  He gets Remicade injections.  Because Plaquenil was both recommended for rheumatoid arthritis as well as COVID, he discussed starting this medication with his rheumatology provider.  This was prescribed.  He started it approximately 10 days ago.  He took it for a week and then 3 days ago started noticing he was getting a rash.  The rash has been itchy.  Started to spread extensively from last night until this morning.  He stopped the Plaquenil 2 days ago.  He reports he has been taking Benadryl but continues to have quite a bit of itching and advancement of the rash.  He perceives a general burning quality in his mouth.  He reports it feels like he is eaten really spicy food but he has not.  No lesions have been visible.  No lesions at the eyes or the penis.  No other history of similar allergic reaction.  No difficulty breathing. Past Medical History:  Diagnosis Date  . Aneurysm, ascending aorta (HCC)    followed br Dr Hortencia Pilar  . History of kidney stones   . Hypertension   . RA (rheumatoid arthritis) ([place])   . 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 S/P right [place] revision 03/02/2016    Past Surgical History:  Procedure Laterality Date  . CARDIAC CATHETERIZATION  Feb 2015    [place]   per pt normal coronary arteries  . CATARACT EXTRACTION W/ INTRAOCULAR LENS IMPLANT Right Mar 2014  . CYSTOSCOPY W/ URETEROSCOPY W/ LITHOTRIPSY    . EXCISION SUBDERMAL NECK TUMOR  2010   benign  . KNEE ARTHROSCOPY W/ MENISCECTOMY Right 01-09-2015   . LEFT ELBOW RECONSTRUCTION  2010  . PARTIAL KNEE ARTHROPLASTY Right 08/08/2015   Procedure: RIGHT UNICOMPARTMENTAL KNEE REVISION PLASTIC;  Surgeon: Paralee Cancel, MD;  Location: [facility];  Service: Orthopedics;  Laterality: Right;  . PARTIAL KNEE ARTHROPLASTY Right 03/02/2016   Procedure: UNICOMPARTMENTAL RIGHT KNEE REVISION;  Surgeon: Paralee Cancel, MD;  Location: [facility];  Service: Orthopedics;  Laterality: Right;  . REPLACEMENT UNICONDYLAR JOINT KNEE Right 06-20-2015  . STRABISMUS SURGERY Left x6   last one --Age 79        Home Medications    Prior to Admission medications   Medication Sig Start Date End Date Taking? Authorizing Provider  ACTEMRA 162 MG/0.9ML SOSY Inject 162 mg into the skin every 14 (fourteen) days. 03/03/16   Danae Orleans, PA-C  atenolol (TENORMIN) 100 MG tablet Take 100 mg by mouth every morning.    [provider]  atorvastatin (LIPITOR) 10 MG tablet Take 10 mg by mouth daily. 12/25/15   [provider]  Cyanocobalamin (VITAMIN B 12) 100 MCG LOZG Take 100 mcg by mouth daily.    [provider]  docusate sodium (COLACE) 100 MG capsule Take 1 capsule (100 mg total) by mouth 2 (two) times daily. 03/03/16   Danae Orleans, PA-C  ergocalciferol (VITAMIN D2) 50000 units capsule Take 50,000 Units by mouth every Friday.  [provider]  ferrous sulfate 325 (65 FE) MG tablet Take 1 tablet (325 mg total) by mouth 3 (three) times daily after meals. 03/03/16   Danae Orleans, PA-C  gabapentin (NEURONTIN) 600 MG tablet Take 600 mg by mouth 2 (two) times daily. 12/25/15   [provider]  Glucosamine-Chondroit-Vit C-Mn (GLUCOSAMINE 1500 COMPLEX PO) Take 2 capsules by mouth daily.    [provider]  HYDROcodone-acetaminophen (NORCO) 7.5-325 MG tablet Take 1-2 tablets by mouth every 4 (four) hours as needed for moderate pain. 03/03/16   Danae Orleans, PA-C  leflunomide (ARAVA) 20 MG tablet Take 20 mg by  mouth daily.    [provider]  lisinopril-hydrochlorothiazide (PRINZIDE,ZESTORETIC) 20-12.5 MG tablet Take 1 tablet by mouth every morning.    [provider]  loratadine-pseudoephedrine (CLARITIN-D 24-HOUR) 10-240 MG 24 hr tablet Take 1 tablet by mouth daily as needed for allergies.    [provider]  methocarbamol (ROBAXIN) 500 MG tablet Take 1 tablet (500 mg total) by mouth every 6 (six) hours as needed for muscle spasms. 03/03/16   Danae Orleans, PA-C  Multiple Vitamin (MULTIVITAMIN) tablet Take 1 tablet by mouth every morning.    [provider]  ondansetron (ZOFRAN) 4 MG tablet Take 4 mg by mouth every 8 (eight) hours as needed for nausea or vomiting.  01/03/15   [provider]  polyethylene glycol (MIRALAX / GLYCOLAX) packet Take 17 g by mouth 2 (two) times daily. 03/03/16   Danae Orleans, PA-C  predniSONE (DELTASONE) 20 MG tablet 3 tabs po daily x 3 days, then 2 tabs x 3 days, then 1.5 tabs x 3 days, then 1 tab x 3 days, then 0.5 tabs x 3 days 01/05/19   Charlesetta Shanks, MD  predniSONE (DELTASONE) 5 MG tablet Take 5 mg by mouth as needed (For rheumatoid arthritis flare ups.).     [provider]  triazolam (HALCION) 0.25 MG tablet Take 0.25 mg by mouth at bedtime.     [provider]    Family History Family History  Problem Relation Age of Onset  . Congestive Heart Failure Mother   . Congestive Heart Failure Father     Social History Social History   Tobacco Use  . Smoking status: Former Smoker    Years: 10.00    Types: Cigarettes    Last attempt to quit: 07/19/1994    Years since quitting: 24.4  . Smokeless tobacco: Never Used  Substance Use Topics  . Alcohol use: No  . Drug use: No     Allergies   Dilaudid [hydromorphone]   Review of Systems Review of Systems  10 Systems reviewed and are negative for acute change except as noted in the HPI.  Physical Exam Updated Vital Signs BP (!) 141/89 (BP  Location: Right Arm)   Pulse 71   Temp 98 F (36.7 C) (Oral)   Resp 18   SpO2 99%   Physical Exam Constitutional:      Comments: Alert nontoxic clinically well in appearance aside from rash.  No respiratory distress.  HENT:     Head: Normocephalic and atraumatic.     Nose: Nose normal.     Mouth/Throat:     Comments: Mucous membranes are pink and moist.  No visible lesions or ulcers of the mucous membranes. Eyes:     Extraocular Movements: Extraocular movements intact.     Conjunctiva/sclera: Conjunctivae normal.     Pupils: Pupils are equal, round, and reactive to light.  Cardiovascular:  Rate and Rhythm: Normal rate and regular rhythm.  Pulmonary:     Effort: Pulmonary effort is normal.     Breath sounds: Normal breath sounds.  Abdominal:     General: There is no distension.     Palpations: Abdomen is soft.     Tenderness: There is no abdominal tenderness.  Musculoskeletal: Normal range of motion.        General: No swelling or tenderness.  Skin:    General: Skin is warm and dry.     Findings: Rash present.  Neurological:     General: No focal deficit present.     Mental Status: He is oriented to person, place, and time.     Coordination: Coordination normal.  Psychiatric:        Mood and Affect: Mood normal.              ED Treatments / Results  Labs (all labs ordered are listed, but only abnormal results are displayed) Labs Reviewed - No data to display  EKG None  Radiology No results found.  Procedures Procedures (including critical care time)  Medications Ordered in ED Medications  methylPREDNISolone sodium succinate (SOLU-MEDROL) 125 mg/2 mL injection 125 mg (has no administration in time range)     Initial Impression / Assessment and Plan / ED Course  I have reviewed the triage vital signs and the nursing notes.  Pertinent labs & imaging results that were available during my care of the patient were reviewed by me and considered in  my medical decision making (see chart for details).       Consult: Reviewed with the patient's main providing physician assistant at rheumatology.  She is very familiar with the case.  We discussed findings.  Recommends IV Solu-Medrol and agrees with prednisone burst and taper.  Patient presents with a rash that is consistent with drug eruption.  No respiratory symptoms.  Lungs are clear.  No signs of angioedema.  At this time, no mucosal lesions to suggest Stevens-Johnson syndrome.  I have extensively reviewed the findings of Stevens-Johnson syndrome with the patient and the need to return if mucosal lesions develop.  He is aware that if he comes to the freestanding ED and findings are concerning, he will need transfer to a tertiary care center.  Patient's rheumatology office will see him on Monday for scheduled recheck in the morning.  He knows to re-present this weekend if symptoms are advancing.  Final Clinical Impressions(s) / ED Diagnoses   Final diagnoses:  Allergic drug rash  Plaquenil adverse reaction in therapeutic use, initial encounter    ED Discharge Orders         Ordered    predniSONE (DELTASONE) 20 MG tablet     01/05/19 1030           Charlesetta Shanks, MD 01/05/19 1031

## 2019-01-06 ENCOUNTER — Encounter (HOSPITAL_BASED_OUTPATIENT_CLINIC_OR_DEPARTMENT_OTHER): Payer: Self-pay | Admitting: Emergency Medicine

## 2019-01-06 ENCOUNTER — Emergency Department (HOSPITAL_BASED_OUTPATIENT_CLINIC_OR_DEPARTMENT_OTHER)
Admission: EM | Admit: 2019-01-06 | Discharge: 2019-01-06 | Disposition: A | Discharge status: Home / Self Care | Payer: Commercial Indemnity | Attending: Emergency Medicine | Admitting: Emergency Medicine

## 2019-01-06 ENCOUNTER — Other Ambulatory Visit: Payer: Self-pay

## 2019-01-06 DIAGNOSIS — Z87891 Personal history of nicotine dependence: Secondary | ICD-10-CM | POA: Diagnosis not present

## 2019-01-06 DIAGNOSIS — Z79899 Other long term (current) drug therapy: Secondary | ICD-10-CM | POA: Insufficient documentation

## 2019-01-06 DIAGNOSIS — I10 Essential (primary) hypertension: Secondary | ICD-10-CM | POA: Diagnosis not present

## 2019-01-06 DIAGNOSIS — Z96651 Presence of right artificial knee joint: Secondary | ICD-10-CM | POA: Diagnosis not present

## 2019-01-06 DIAGNOSIS — T7840XD Allergy, unspecified, subsequent encounter: Secondary | ICD-10-CM | POA: Insufficient documentation

## 2019-01-06 DIAGNOSIS — R21 Rash and other nonspecific skin eruption: Secondary | ICD-10-CM | POA: Diagnosis present

## 2019-01-06 MED ORDER — HYDROXYZINE HCL 25 MG PO TABS: 25.0000 mg | ORAL_TABLET | Freq: Four times a day (QID) | ORAL | 0 refills | Status: DC | PRN

## 2019-01-06 MED ORDER — HYDROXYZINE HCL 25 MG PO TABS
25.0000 mg | ORAL_TABLET | Freq: Once | ORAL | Status: AC
  Administered 2019-01-06: 25 mg via ORAL
  Filled 2019-01-06: qty 1

## 2019-01-06 MED ORDER — DEXAMETHASONE SODIUM PHOSPHATE 10 MG/ML IJ SOLN
10.0000 mg | Freq: Once | INTRAMUSCULAR | Status: AC
  Administered 2019-01-06: 10 mg via INTRAMUSCULAR
  Filled 2019-01-06: qty 1

## 2019-01-06 NOTE — ED Provider Notes (Signed)
[facility] EMERGENCY DEPARTMENT Provider Note   CSN: [id number] Arrival date & time: 01/06/19  7681    History   Chief Complaint Chief Complaint  Patient presents with  . Follow-up    HPI Luis Wilkinson is a 63 y.o. male.     Patient with history of rheumatoid arthritis, high blood pressure presents with worsening itchy rash across chest face arms for the past few days.  The only thing new in terms of exposure is Plaquenil for which she stopped.  Patient received steroids in the emergency room yesterday and has been taking Benadryl regularly without significant relief.  Patient denies other new exposures.  No breathing difficulty.     Past Medical History:  Diagnosis Date  . Aneurysm, ascending aorta (HCC)    followed br Dr Hortencia Pilar  . History of kidney stones   . Hypertension   . RA (rheumatoid arthritis) ([place])   . 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 S/P right [place] revision 03/02/2016    Past Surgical History:  Procedure Laterality Date  . CARDIAC CATHETERIZATION  Feb 2015    [place]   per pt normal coronary arteries  . CATARACT EXTRACTION W/ INTRAOCULAR LENS IMPLANT Right Mar 2014  . CYSTOSCOPY W/ URETEROSCOPY W/ LITHOTRIPSY    . EXCISION SUBDERMAL NECK TUMOR  2010   benign  . KNEE ARTHROSCOPY W/ MENISCECTOMY Right 01-09-2015  . LEFT ELBOW RECONSTRUCTION  2010  . PARTIAL KNEE ARTHROPLASTY Right 08/08/2015   Procedure: RIGHT UNICOMPARTMENTAL KNEE REVISION PLASTIC;  Surgeon: Paralee Cancel, MD;  Location: [facility];  Service: Orthopedics;  Laterality: Right;  . PARTIAL KNEE ARTHROPLASTY Right 03/02/2016   Procedure: UNICOMPARTMENTAL RIGHT KNEE REVISION;  Surgeon: Paralee Cancel, MD;  Location: [facility];  Service: Orthopedics;  Laterality: Right;  . REPLACEMENT UNICONDYLAR  JOINT KNEE Right 06-20-2015  . STRABISMUS SURGERY Left x6   last one --Age 89        Home Medications    Prior to Admission medications   Medication Sig Start Date End Date Taking? Authorizing Provider  ACTEMRA 162 MG/0.9ML SOSY Inject 162 mg into the skin every 14 (fourteen) days. 03/03/16   Danae Orleans, PA-C  atenolol (TENORMIN) 100 MG tablet Take 100 mg by mouth every morning.    [provider]  atorvastatin (LIPITOR) 10 MG tablet Take 10 mg by mouth daily. 12/25/15   [provider]  Cyanocobalamin (VITAMIN B 12) 100 MCG LOZG Take 100 mcg by mouth daily.    [provider]  docusate sodium (COLACE) 100 MG capsule Take 1 capsule (100 mg total) by mouth 2 (two) times daily. 03/03/16   Danae Orleans, PA-C  ergocalciferol (VITAMIN D2) 50000 units capsule Take 50,000 Units by mouth every Friday.     [provider]  ferrous sulfate 325 (65 FE) MG tablet Take 1 tablet (325 mg total) by mouth 3 (three) times daily after meals. 03/03/16   Danae Orleans, PA-C  gabapentin (NEURONTIN) 600 MG tablet Take 600 mg by mouth 2 (two) times daily. 12/25/15   [provider]  Glucosamine-Chondroit-Vit C-Mn (GLUCOSAMINE 1500 COMPLEX PO) Take 2 capsules by mouth daily.    [provider]  HYDROcodone-acetaminophen (NORCO) 7.5-325 MG tablet Take 1-2 tablets by mouth every 4 (four) hours as needed for moderate pain. 03/03/16  Danae Orleans, PA-C  leflunomide (ARAVA) 20 MG tablet Take 20 mg by mouth daily.    [provider]  lisinopril-hydrochlorothiazide (PRINZIDE,ZESTORETIC) 20-12.5 MG tablet Take 1 tablet by mouth every morning.    [provider]  loratadine-pseudoephedrine (CLARITIN-D 24-HOUR) 10-240 MG 24 hr tablet Take 1 tablet by mouth daily as needed for allergies.    [provider]  methocarbamol (ROBAXIN) 500 MG tablet Take 1 tablet (500 mg total) by mouth every 6 (six) hours as needed for muscle spasms. 03/03/16    Danae Orleans, PA-C  Multiple Vitamin (MULTIVITAMIN) tablet Take 1 tablet by mouth every morning.    [provider]  ondansetron (ZOFRAN) 4 MG tablet Take 4 mg by mouth every 8 (eight) hours as needed for nausea or vomiting.  01/03/15   [provider]  polyethylene glycol (MIRALAX / GLYCOLAX) packet Take 17 g by mouth 2 (two) times daily. 03/03/16   Danae Orleans, PA-C  predniSONE (DELTASONE) 20 MG tablet 3 tabs po daily x 3 days, then 2 tabs x 3 days, then 1.5 tabs x 3 days, then 1 tab x 3 days, then 0.5 tabs x 3 days 01/05/19   Charlesetta Shanks, MD  predniSONE (DELTASONE) 5 MG tablet Take 5 mg by mouth as needed (For rheumatoid arthritis flare ups.).     [provider]  triazolam (HALCION) 0.25 MG tablet Take 0.25 mg by mouth at bedtime.     [provider]    Family History Family History  Problem Relation Age of Onset  . Congestive Heart Failure Mother   . Congestive Heart Failure Father     Social History Social History   Tobacco Use  . Smoking status: Former Smoker    Years: 10.00    Types: Cigarettes    Last attempt to quit: 07/19/1994    Years since quitting: 24.4  . Smokeless tobacco: Never Used  Substance Use Topics  . Alcohol use: No  . Drug use: No     Allergies   Dilaudid [hydromorphone]   Review of Systems Review of Systems  Constitutional: Negative for chills and fever.  HENT: Negative for congestion.   Respiratory: Negative for shortness of breath.   Cardiovascular: Negative for chest pain.  Gastrointestinal: Negative for abdominal pain and vomiting.  Musculoskeletal: Negative for back pain, neck pain and neck stiffness.  Skin: Positive for rash.  Neurological: Negative for light-headedness and headaches.     Physical Exam Updated Vital Signs BP (!) 159/89 (BP Location: Right Arm)   Pulse 75   Temp 97.8 F (36.6 C) (Oral)   Resp 16   SpO2 97%   Physical Exam Vitals signs and nursing note reviewed.   Constitutional:      Appearance: He is well-developed.  HENT:     Head: Normocephalic and atraumatic.  Eyes:     General:        Right eye: No discharge.        Left eye: No discharge.  Neck:     Musculoskeletal: Normal range of motion.     Trachea: No tracheal deviation.  Cardiovascular:     Rate and Rhythm: Normal rate.  Pulmonary:     Effort: Pulmonary effort is normal.     Breath sounds: No stridor.  Abdominal:     General: There is no distension.     Tenderness: There is no guarding.  Skin:    General: Skin is warm.     Findings: Rash present.  Comments: Patient has macules/hives across chest abdomen arms and head.  No angioedema.  Neurological:     Mental Status: He is alert and oriented to person, place, and time.      ED Treatments / Results  Labs (all labs ordered are listed, but only abnormal results are displayed) Labs Reviewed - No data to display  EKG None  Radiology No results found.  Procedures Procedures (including critical care time)  Medications Ordered in ED Medications  dexamethasone (DECADRON) injection 10 mg (has no administration in time range)  hydrOXYzine (ATARAX/VISTARIL) tablet 25 mg (has no administration in time range)     Initial Impression / Assessment and Plan / ED Course  I have reviewed the triage vital signs and the nursing notes.  Pertinent labs & imaging results that were available during my care of the patient were reviewed by me and considered in my medical decision making (see chart for details).       Patient presents with worsening itchy rash likely reaction from Plaquenil.  Patient stopped this medication.  Plan for Decadron and hydroxyzine to help with symptoms. No anaphylaxis signs.  Results and differential diagnosis were discussed with the patient/parent/guardian. Xrays were independently reviewed by myself.  Close follow up outpatient was discussed, comfortable with the plan.   Medications   dexamethasone (DECADRON) injection 10 mg (has no administration in time range)  hydrOXYzine (ATARAX/VISTARIL) tablet 25 mg (has no administration in time range)    Vitals:   01/06/19 0930  BP: (!) 159/89  Pulse: 75  Resp: 16  Temp: 97.8 F (36.6 C)  TempSrc: Oral  SpO2: 97%    Final diagnoses:  Allergic reaction to drug, subsequent encounter     Final Clinical Impressions(s) / ED Diagnoses   Final diagnoses:  Allergic reaction to drug, subsequent encounter    ED Discharge Orders    None       Elnora Morrison, MD 01/06/19 [phone]

## 2019-01-06 NOTE — Discharge Instructions (Addendum)
Return to the emergency room for tongue swelling, breathing difficulty or worsening symptoms. Try hydroxyzine as needed for itching.  Continue to hold the medication you feel caused your symptoms.  Take note of other possible causes with exposures in your home.

## 2019-01-06 NOTE — ED Triage Notes (Signed)
Pt continues to have rash with itching.  Pt states he has flushing.  Pt states the rash seems to be worsening.

## 2019-01-12 ENCOUNTER — Other Ambulatory Visit: Payer: Self-pay

## 2019-01-12 ENCOUNTER — Emergency Department (HOSPITAL_BASED_OUTPATIENT_CLINIC_OR_DEPARTMENT_OTHER)
Admission: EM | Admit: 2019-01-12 | Discharge: 2019-01-12 | Disposition: A | Discharge status: Home / Self Care | Payer: Commercial Indemnity | Attending: Emergency Medicine | Admitting: Emergency Medicine

## 2019-01-12 ENCOUNTER — Encounter (HOSPITAL_BASED_OUTPATIENT_CLINIC_OR_DEPARTMENT_OTHER): Payer: Self-pay | Admitting: Emergency Medicine

## 2019-01-12 DIAGNOSIS — Z79899 Other long term (current) drug therapy: Secondary | ICD-10-CM | POA: Insufficient documentation

## 2019-01-12 DIAGNOSIS — M069 Rheumatoid arthritis, unspecified: Secondary | ICD-10-CM | POA: Diagnosis not present

## 2019-01-12 DIAGNOSIS — I10 Essential (primary) hypertension: Secondary | ICD-10-CM | POA: Diagnosis not present

## 2019-01-12 DIAGNOSIS — T7840XA Allergy, unspecified, initial encounter: Secondary | ICD-10-CM | POA: Insufficient documentation

## 2019-01-12 DIAGNOSIS — Z87891 Personal history of nicotine dependence: Secondary | ICD-10-CM | POA: Diagnosis not present

## 2019-01-12 MED ORDER — DIPHENHYDRAMINE HCL 50 MG/ML IJ SOLN
50.0000 mg | Freq: Once | INTRAMUSCULAR | Status: AC
  Administered 2019-01-12: 50 mg via INTRAMUSCULAR
  Filled 2019-01-12: qty 1

## 2019-01-12 MED ORDER — FAMOTIDINE 20 MG PO TABS
20.0000 mg | ORAL_TABLET | Freq: Once | ORAL | Status: AC
  Administered 2019-01-12: 08:00:00 20 mg via ORAL
  Filled 2019-01-12: qty 1

## 2019-01-12 MED ORDER — PREDNISONE 10 MG (21) PO TBPK: ORAL_TABLET | Freq: Every day | ORAL | 0 refills | Status: DC

## 2019-01-12 MED ORDER — PREDNISONE 50 MG PO TABS
60.0000 mg | ORAL_TABLET | Freq: Once | ORAL | Status: AC
  Administered 2019-01-12: 60 mg via ORAL
  Filled 2019-01-12: qty 1

## 2019-01-12 MED ORDER — DIPHENHYDRAMINE HCL 25 MG PO TABS: 50.0000 mg | ORAL_TABLET | Freq: Four times a day (QID) | ORAL | 0 refills | Status: DC | PRN

## 2019-01-12 MED ORDER — FAMOTIDINE 20 MG PO TABS: 20.0000 mg | ORAL_TABLET | Freq: Two times a day (BID) | ORAL | 0 refills | Status: DC

## 2019-01-12 NOTE — ED Provider Notes (Signed)
[facility] EMERGENCY DEPARTMENT Provider Note   CSN: [id number] Arrival date & time: 01/12/19  0744    History   Chief Complaint Chief Complaint  Patient presents with  . Allergic Reaction    HPI Luis Wilkinson is a 63 y.o. male.     HPI Patient is a 63 year old male with a history of rheumatoid arthritis for which she was recently started on Plaquenil.  He developed allergic reaction after this and this was stopped approximately 2 weeks ago.  He underwent a course of prednisone and Benadryl with improvement in symptoms but not resolution.  He presents the emergency department today because of increasing hives and new swelling around his left eye without difficulty breathing or swallowing.  He has not tried any medications today.  He has not had Benadryl or Pepcid today.  He reports no new exposures or medicines or foods.  Past Medical History:  Diagnosis Date  . Aneurysm, ascending aorta (HCC)    followed br Dr Hortencia Pilar  . History of kidney stones   . Hypertension   . RA (rheumatoid arthritis) ([place])   . 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 S/P right [place] revision 03/02/2016    Past Surgical History:  Procedure Laterality Date  . CARDIAC CATHETERIZATION  Feb 2015    [place]   per pt normal coronary arteries  . CATARACT EXTRACTION W/ INTRAOCULAR LENS IMPLANT Right Mar 2014  . CYSTOSCOPY W/ URETEROSCOPY W/ LITHOTRIPSY    . EXCISION SUBDERMAL NECK TUMOR  2010   benign  . KNEE ARTHROSCOPY W/ MENISCECTOMY Right 01-09-2015  . LEFT ELBOW RECONSTRUCTION  2010  . PARTIAL KNEE ARTHROPLASTY Right 08/08/2015   Procedure: RIGHT UNICOMPARTMENTAL KNEE REVISION PLASTIC;  Surgeon: Paralee Cancel, MD;  Location: [facility];  Service: Orthopedics;  Laterality: Right;  . PARTIAL KNEE  ARTHROPLASTY Right 03/02/2016   Procedure: UNICOMPARTMENTAL RIGHT KNEE REVISION;  Surgeon: Paralee Cancel, MD;  Location: [facility];  Service: Orthopedics;  Laterality: Right;  . REPLACEMENT UNICONDYLAR JOINT KNEE Right 06-20-2015  . STRABISMUS SURGERY Left x6   last one --Age 78        Home Medications    Prior to Admission medications   Medication Sig Start Date End Date Taking? Authorizing Provider  ACTEMRA 162 MG/0.9ML SOSY Inject 162 mg into the skin every 14 (fourteen) days. 03/03/16   Danae Orleans, PA-C  atenolol (TENORMIN) 100 MG tablet Take 100 mg by mouth every morning.    [provider]  atorvastatin (LIPITOR) 10 MG tablet Take 10 mg by mouth daily. 12/25/15   [provider]  Cyanocobalamin (VITAMIN B 12) 100 MCG LOZG Take 100 mcg by mouth daily.    [provider]  diphenhydrAMINE (BENADRYL) 25 MG tablet Take 2 tablets (50 mg total) by mouth every 6 (six) hours as needed for up to 5 days for itching or allergies. 01/12/19 01/17/19  Jola Schmidt, MD  docusate sodium (COLACE) 100 MG capsule Take 1 capsule (100 mg total) by mouth 2 (two) times daily. 03/03/16   Danae Orleans, PA-C  ergocalciferol (VITAMIN D2) 50000 units capsule Take 50,000 Units by mouth every Friday.     [provider]  famotidine (PEPCID) 20 MG tablet Take 1 tablet (20 mg total) by mouth 2 (two) times daily. 01/12/19   Jola Schmidt,  MD  ferrous sulfate 325 (65 FE) MG tablet Take 1 tablet (325 mg total) by mouth 3 (three) times daily after meals. 03/03/16   Danae Orleans, PA-C  gabapentin (NEURONTIN) 600 MG tablet Take 600 mg by mouth 2 (two) times daily. 12/25/15   [provider]  Glucosamine-Chondroit-Vit C-Mn (GLUCOSAMINE 1500 COMPLEX PO) Take 2 capsules by mouth daily.    [provider]  HYDROcodone-acetaminophen (NORCO) 7.5-325 MG tablet Take 1-2 tablets by mouth every 4 (four) hours as needed for moderate pain. 03/03/16   Danae Orleans, PA-C  hydrOXYzine  (ATARAX/VISTARIL) 25 MG tablet Take 1 tablet (25 mg total) by mouth every 6 (six) hours as needed for itching. 01/06/19   Elnora Morrison, MD  leflunomide (ARAVA) 20 MG tablet Take 20 mg by mouth daily.    [provider]  lisinopril-hydrochlorothiazide (PRINZIDE,ZESTORETIC) 20-12.5 MG tablet Take 1 tablet by mouth every morning.    [provider]  loratadine-pseudoephedrine (CLARITIN-D 24-HOUR) 10-240 MG 24 hr tablet Take 1 tablet by mouth daily as needed for allergies.    [provider]  methocarbamol (ROBAXIN) 500 MG tablet Take 1 tablet (500 mg total) by mouth every 6 (six) hours as needed for muscle spasms. 03/03/16   Danae Orleans, PA-C  Multiple Vitamin (MULTIVITAMIN) tablet Take 1 tablet by mouth every morning.    [provider]  ondansetron (ZOFRAN) 4 MG tablet Take 4 mg by mouth every 8 (eight) hours as needed for nausea or vomiting.  01/03/15   [provider]  polyethylene glycol (MIRALAX / GLYCOLAX) packet Take 17 g by mouth 2 (two) times daily. 03/03/16   Danae Orleans, PA-C  predniSONE (STERAPRED UNI-PAK 21 TAB) 10 MG (21) TBPK tablet Take by mouth daily. Take 6 tabs by mouth daily  for 2 days, then 5 tabs for 2 days, then 4 tabs for 2 days, then 3 tabs for 2 days, 2 tabs for 2 days, then 1 tab by mouth daily for 2 days 01/12/19   Jola Schmidt, MD  triazolam (HALCION) 0.25 MG tablet Take 0.25 mg by mouth at bedtime.     [provider]    Family History Family History  Problem Relation Age of Onset  . Congestive Heart Failure Mother   . Congestive Heart Failure Father     Social History Social History   Tobacco Use  . Smoking status: Former Smoker    Years: 10.00    Types: Cigarettes    Last attempt to quit: 07/19/1994    Years since quitting: 24.5  . Smokeless tobacco: Never Used  Substance Use Topics  . Alcohol use: No  . Drug use: No     Allergies   Dilaudid [hydromorphone] and Plaquenil [hydroxychloroquine  sulfate]   Review of Systems Review of Systems  All other systems reviewed and are negative.    Physical Exam Updated Vital Signs BP (!) 161/102 (BP Location: Right Arm)   Pulse 85   Temp 97.8 F (36.6 C) (Oral)   Resp 18   Ht 5\' 10"  (1.778 m)   Wt 82.6 kg   SpO2 100%   BMI 26.11 kg/m   Physical Exam Vitals signs and nursing note reviewed.  Constitutional:      Appearance: He is well-developed.  HENT:     Head: Normocephalic and atraumatic.     Comments: Mild periorbital swelling on the left.  Oral airway patent.  No stridor.  Tongue normal in size.  Uvula normal.  Tolerating secretions. Neck:  Musculoskeletal: Normal range of motion.  Cardiovascular:     Rate and Rhythm: Normal rate and regular rhythm.     Heart sounds: Normal heart sounds.  Pulmonary:     Effort: Pulmonary effort is normal. No respiratory distress.     Breath sounds: Normal breath sounds.  Abdominal:     General: There is no distension.     Palpations: Abdomen is soft.     Tenderness: There is no abdominal tenderness.  Musculoskeletal: Normal range of motion.  Skin:    General: Skin is warm and dry.     Comments: Hives diffusely on arms abdomen chest back and legs  Neurological:     Mental Status: He is alert and oriented to person, place, and time.  Psychiatric:        Judgment: Judgment normal.      ED Treatments / Results  Labs (all labs ordered are listed, but only abnormal results are displayed) Labs Reviewed - No data to display  EKG None  Radiology No results found.  Procedures Procedures (including critical care time)  Medications Ordered in ED Medications  diphenhydrAMINE (BENADRYL) injection 50 mg (50 mg Intramuscular Given 01/12/19 0820)  famotidine (PEPCID) tablet 20 mg (20 mg Oral Given 01/12/19 0819)  predniSONE (DELTASONE) tablet 60 mg (60 mg Oral Given 01/12/19 0819)     Initial Impression / Assessment and Plan / ED Course  I have reviewed the triage vital  signs and the nursing notes.  Pertinent labs & imaging results that were available during my care of the patient were reviewed by me and considered in my medical decision making (see chart for details).        Allergic reaction without signs of anaphylaxis.  Patient will be given Benadryl Pepcid and a 7-day prednisone taper.  Given the prolonged nature of his symptoms I recommended referral to an allergist.  He understands to return to the ER for new or worsening symptoms.  Indication for additional work-up or treatment at this time.  Final Clinical Impressions(s) / ED Diagnoses   Final diagnoses:  Allergic reaction, initial encounter    ED Discharge Orders         Ordered    diphenhydrAMINE (BENADRYL) 25 MG tablet  Every 6 hours PRN     01/12/19 0826    famotidine (PEPCID) 20 MG tablet  2 times daily     01/12/19 0826    predniSONE (STERAPRED UNI-PAK 21 TAB) 10 MG (21) TBPK tablet  Daily     01/12/19 0826           Jola Schmidt, MD 01/12/19 [phone]

## 2019-01-12 NOTE — ED Notes (Signed)
Pt verbalized understanding of dc instructions.

## 2019-01-12 NOTE — ED Triage Notes (Signed)
Pt reports ongoing allergic reaction x 2 weeks. Has been seen and treated but is having no relief. Noted to have swollen eyes and hives.

## 2019-03-16 ENCOUNTER — Ambulatory Visit (INDEPENDENT_AMBULATORY_CARE_PROVIDER_SITE_OTHER)
Admission: RE | Admit: 2019-03-16 | Discharge: 2019-03-16 | Disposition: A | Discharge status: Home / Self Care | Payer: Commercial Indemnity | Source: Ambulatory Visit | Attending: Internal Medicine | Admitting: Internal Medicine

## 2019-03-16 ENCOUNTER — Ambulatory Visit (INDEPENDENT_AMBULATORY_CARE_PROVIDER_SITE_OTHER): Payer: Commercial Indemnity | Admitting: Internal Medicine

## 2019-03-16 ENCOUNTER — Ambulatory Visit: Payer: Commercial Indemnity

## 2019-03-16 ENCOUNTER — Other Ambulatory Visit: Payer: Self-pay

## 2019-03-16 ENCOUNTER — Encounter: Payer: Self-pay | Admitting: Internal Medicine

## 2019-03-16 DIAGNOSIS — I10 Essential (primary) hypertension: Secondary | ICD-10-CM | POA: Diagnosis not present

## 2019-03-16 DIAGNOSIS — R0609 Other forms of dyspnea: Secondary | ICD-10-CM

## 2019-03-16 DIAGNOSIS — R0600 Dyspnea, unspecified: Secondary | ICD-10-CM

## 2019-03-16 HISTORY — DX: Other forms of dyspnea: R06.09

## 2019-03-16 HISTORY — DX: Dyspnea, unspecified: R06.00

## 2019-03-16 LAB — BASIC METABOLIC PANEL
BUN: 18 mg/dL (ref 6–23)
CO2: 29 mEq/L (ref 19–32)
Calcium: 9.2 mg/dL (ref 8.4–10.5)
Chloride: 104 mEq/L (ref 96–112)
Creatinine, Ser: 1.16 mg/dL (ref 0.40–1.50)
GFR: 63.72 mL/min (ref >60.00)
Glucose, Bld: 157 mg/dL — ABNORMAL HIGH (ref 70–99)
Potassium: 4 mEq/L (ref 3.5–5.1)
Sodium: 138 mEq/L (ref 135–145)

## 2019-03-16 LAB — SEDIMENTATION RATE: Sed Rate: 2 mm/hr (ref 0–20)

## 2019-03-16 LAB — CBC WITH DIFFERENTIAL/PLATELET
Basophils Absolute: 0.1 10*3/uL (ref 0.0–0.1)
Basophils Relative: 1.3 % (ref 0.0–3.0)
Eosinophils Absolute: 0.8 10*3/uL — ABNORMAL HIGH (ref 0.0–0.7)
Eosinophils Relative: 12.7 % — ABNORMAL HIGH (ref 0.0–5.0)
HCT: 35.5 % — ABNORMAL LOW (ref 39.0–52.0)
Hemoglobin: 12 g/dL — ABNORMAL LOW (ref 13.0–17.0)
Lymphocytes Relative: 27.1 % (ref 12.0–46.0)
Lymphs Abs: 1.7 10*3/uL (ref 0.7–4.0)
MCHC: 33.8 g/dL (ref 30.0–36.0)
MCV: 96.5 fl (ref 78.0–100.0)
Monocytes Absolute: 1.1 10*3/uL — ABNORMAL HIGH (ref 0.1–1.0)
Monocytes Relative: 18 % — ABNORMAL HIGH (ref 3.0–12.0)
Neutro Abs: 2.6 10*3/uL (ref 1.4–7.7)
Neutrophils Relative %: 40.9 % — ABNORMAL LOW (ref 43.0–77.0)
Platelets: 235 10*3/uL (ref 150.0–400.0)
RBC: 3.68 Mil/uL — ABNORMAL LOW (ref 4.22–5.81)
RDW: 13.8 % (ref 11.5–15.5)
WBC: 6.3 10*3/uL (ref 4.0–10.5)

## 2019-03-16 LAB — TSH: TSH: 0.63 u[IU]/mL (ref 0.35–4.50)

## 2019-03-16 LAB — BRAIN NATRIURETIC PEPTIDE: Pro B Natriuretic peptide (BNP): 100 pg/mL (ref 0.0–100.0)

## 2019-03-16 MED ORDER — TELMISARTAN-HCTZ 80-25 MG PO TABS: 1.0000 | ORAL_TABLET | Freq: Every day | ORAL | 2 refills | Status: DC

## 2019-03-16 MED ORDER — ATENOLOL 100 MG PO TABS: ORAL_TABLET | ORAL | Status: DC

## 2019-03-16 MED ORDER — PANTOPRAZOLE SODIUM 40 MG PO TBEC: DELAYED_RELEASE_TABLET | ORAL | 11 refills | Status: DC

## 2019-03-16 NOTE — Assessment & Plan Note (Signed)
Change from ACE inhibitor to ARB 03/16/2019 due to unexplained dry cough and chest tightness   In the best review of chronic cough to date ( NEJM 2016 375 [id number]) ,  ACEi are now felt to cause cough in up to  20% of pts which is a 4 fold increase from previous reports and does not include the variety of non-specific complaints we see in pulmonary clinic in pts on ACEi but previously attributed to another dx like  Copd/asthma and  include PNDS, throat and chest congestion, "bronchitis", unexplained dyspnea and chest tightness  and noct "strangling" sensations, and hoarseness, but also  atypical /refractory GERD symptoms like dysphagia and "bad heartburn"   The only way I know  to prove this is not an "ACEi Case" is a trial off ACEi x a minimum of 6 weeks then regroup.   rec [id number] one daily and in increase atenolol to 100 mg in the morning and 50 mg in p.m. as he is not adequately beta blocked at this point.  He monitors his blood pressure closely and will call me if this is not adequate.   Total time devoted to counseling  > 50 % of initial 60 min office visit:  review case with pt/Debbie by speakerphone/  directly observed portions of ambulatory 02 saturation study/ discussion of options/alternatives/ personally creating written customized instructions  in presence of pt  then going over those specific  Instructions directly with the pt including how to use all of the meds but in particular covering each new medication in detail and the difference between the maintenance= "automatic" meds and the prns using an action plan format for the latter (If this problem/symptom => do that organization reading Left to right).  Please see AVS from this visit for a full list of these instructions which I personally wrote for this pt and  are unique to this visit.

## 2019-03-16 NOTE — Assessment & Plan Note (Addendum)
Onset early June 2020 with assoc chest tightness  - CTa 02/23/19 several segmental and subsegmental PE with miconodular lung dz ? Etiology not seen on PA /lat today - 03/16/2019   Walked RA  2 laps @  approx 267ft each @ nl pace  stopped due to  End of study with some sob and chest tightness with sats 98%   - trial off acei and max rx for gerd 03/16/2019    Symptoms at this point 2 weeks into rx for relativley small PE burden are disproportionate to objective findings and not clear to what extent this is actually a pulmonary  problem but pt does appear to have difficult to sort out respiratory symptoms of unknown origin for which  DDX  = almost all start with A and  include Adherence, Ace Inhibitors, Acid Reflux, Active Sinus Disease, Alpha 1 Antitripsin deficiency, Anxiety masquerading as Airways dz,  ABPA,  Allergy(esp in young), Aspiration (esp in elderly), Adverse effects of meds,  Active smoking or Vaping, A bunch of PE's/clot burden (a few small clots can't cause this syndrome unless there is already severe underlying pulm or vascular dz with poor reserve),  Anemia or thyroid disorder, plus two Bs  = Bronchiectasis and Beta blocker use..and one C= CHF    Adherence is always the initial "prime suspect" and is a multilayered concern that requires a "trust but verify" approach in every patient - starting with knowing how to use medications, especially inhalers, correctly, keeping up with refills and understanding the fundamental difference between maintenance and prns vs those medications only taken for a very short course and then stopped and not refilled.  - return with all meds in hand using a trust but verify approach to confirm accurate Medication  Reconciliation The principal here is that until we are certain that the  patients are doing what we've asked, it makes no sense to ask them to do more.   ACEi adverse effects at the  top of the usual list of suspects and the only way to rule it out is a  trial off > see a/p    ? Acid (or non-acid) GERD > always difficult to exclude as up to 75% of pts in some series report no assoc GI/ Heartburn symptoms> rec max (24h)  acid suppression and diet restrictions/ reviewed and instructions given in writing.   ? Anxiety/deconditioning  > usually at the bottom of this list of usual suspects but included   and may interfere with adherence and also interpretation of response or lack thereof to symptom management which can be quite subjective.   ? Anemia/ thyroid dz > excluded by today's labs  ? Allergy/ asthma > lack of associated rhinitis or nocturnal symptoms is against asthma although I note he does have a childhood history and has an elevated eosinophil level so this needs to be kept in the differential.  ? A  Bunch of PE's > he is focused on how much of the blood clots have resolved at this point and I am not sure how much they actually contributed to his symptoms as he really did not have the typical presentation for pulmonary embolism.  However, he has been adequately covered at this point with Eliquis and an echocardiogram needs to be obtained to make sure he does not have any evidence of right heart strain to explain why he has ongoing dyspnea but other than that I do not recommend any change in therapy for the next 5 and  half months.  At 6 months he will need repeat echo if his baseline echo is abnormal and also venous Dopplers and then perhaps consultation with hematologist regarding long-term anticoagulation as I believe PE was unprovoked.  ? CHF/ IHD > apparently this was already evaluated but not since the onset of his new symptoms.  His BNP though is not impressive and a troponin was obtained just for completeness since he was complaining of ongoing chest tightness.

## 2019-03-16 NOTE — Patient Instructions (Addendum)
Increase atenolol to where you take 100 mg in am and 50 mg (one half of 100) in pm   Change pantoprazole to 40 mg Take 30- 60 min before your first and last meals of the day   GERD (REFLUX)  is an extremely common cause of respiratory symptoms just like yours , many times with no obvious heartburn at all.    It can be treated with medication, but also with lifestyle changes including elevation of the head of your bed (ideally with 6 -8inch blocks under the headboard of your bed),  Smoking cessation, avoidance of late meals, excessive alcohol, and avoid fatty foods, chocolate, peppermint, colas, red wine, and acidic juices such as orange juice.  NO MINT OR MENTHOL PRODUCTS SO NO COUGH DROPS  USE SUGARLESS CANDY INSTEAD (Jolley ranchers or Stover's or Life Savers) or even ice chips will also do - the key is to swallow to prevent all throat clearing. NO OIL BASED VITAMINS - use powdered substitutes.  Avoid fish oil when coughing.      Stop lisinopril   Start Micardis 80-25 one daily in place of lisinopril   Please remember to go to the lab and x-ray department   for your tests - we will call you with the results when they are available.     Please schedule a follow up office visit in  2 weeks, sooner if needed  And we will schedule you an echocardiogram in meantime

## 2019-03-16 NOTE — Progress Notes (Signed)
Spoke with pt and notified of results per Dr. Wert. Pt verbalized understanding and denied any questions. 

## 2019-03-16 NOTE — Progress Notes (Signed)
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 Pulmonary Consult    Referred by Luis. Sheppard Wilkinson for sob with exertion and abnormal CT scan. Patient reports that he has sob with exertion and any long distance walking.   Dyspnea:  Chest feels heavy at rest and p 50-100 ft worse assoc with sob no better since started eliquis  Cough: dry cough since onset of doe early June 2020 Sleep: fine lying down flat  SABA use: none  No obvious day to day or daytime variability or assoc excess/ purulent sputum or mucus plugs or hemoptysis or cp or   subjective wheeze or overt sinus or hb symptoms.   Sleeping as above without nocturnal  or early am exacerbation  of respiratory  c/o's or need for noct saba. Also denies any obvious fluctuation of symptoms with weather or environmental changes or other aggravating or alleviating factors except as outlined above   No unusual exposure hx or h/o childhood pna  or knowledge of premature birth.  Current Allergies, Complete Past Medical History, Past Surgical History, Family History, and Social History were reviewed in [facility] record.  ROS  The following are not active complaints unless bolded Hoarseness, sore throat, dysphagia, dental problems, itching, sneezing,  nasal congestion or discharge of excess mucus or purulent secretions, ear ache,   fever, chills, sweats, unintended wt loss  or wt gain, classically pleuritic or exertional cp,  orthopnea pnd or arm/hand swelling  or leg swelling, presyncope, palpitations, abdominal pain, anorexia, nausea, vomiting, diarrhea  or change in bowel habits or change in bladder habits, change in stools or change in urine, dysuria, hematuria,  rash, arthralgias, visual complaints, headache, numbness, weakness or ataxia or problems with walking or coordination,  change in mood or  memory.           Past Medical History:  Diagnosis Date  . Aneurysm, ascending aorta (HCC)    followed br Luis Wilkinson  . History of kidney stones   . Hypertension   . RA (rheumatoid arthritis) ([place])   . 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 ACTEMRA 162 MG/0.9ML SOSY Inject 162 mg into the skin every 14 (fourteen) days.    Marland Kitchen atorvastatin (LIPITOR) 10 MG tablet Take 10 mg by mouth daily.    . Cyanocobalamin (VITAMIN B 12) 100 MCG LOZG Take 100 mcg by mouth daily.    . Eliquis DVT/PE Starter Pack (ELIQUIS STARTER PACK) 5 MG TABS     . ergocalciferol (VITAMIN D2) 50000 units capsule Take 50,000 Units by mouth every Friday.     . gabapentin (NEURONTIN) 600 MG tablet Take 600 mg by mouth 2 (two) times daily.    . Glucosamine-Chondroit-Vit C-Mn (GLUCOSAMINE 1500 COMPLEX PO)  Take 2 capsules by mouth daily.    Marland Kitchen leflunomide (ARAVA) 20 MG tablet Take 20 mg by mouth daily.    Marland Kitchen lisinopril-hydrochlorothiazide (PRINZIDE,ZESTORETIC) 20-12.5 MG tablet Take 1 tablet by mouth every morning.    . loratadine-pseudoephedrine (CLARITIN-D 24-HOUR) 10-240 MG 24 hr tablet Take 1 tablet by mouth daily as needed for allergies.    . Multiple Vitamin (MULTIVITAMIN) tablet Take 1 tablet by mouth every morning.    . pantoprazole (PROTONIX) 40 MG tablet     . predniSONE (DELTASONE) 5 MG tablet 5 mg 2 (two) times a day as needed.    . temazepam (RESTORIL) 15 MG capsule      . lisinopril-hydrochlorothiazide (ZESTORETIC) 20-12.5 MG tablet lisinopril 20 mg-hydrochlorothiazide 12.5 mg tablet    . atenolol (TENORMIN) 100 MG tablet Take 100 mg by mouth every morning.    . docusate sodium (COLACE) 100 MG capsule Take 1 capsule (100 mg total) by mouth 2 (two) times daily. (Patient not taking: Reported on 03/16/2019) 10 capsule 0  . famotidine (PEPCID) 20 MG tablet Take 1 tablet (20 mg total) by mouth 2 (two) times daily. (Patient not taking: Reported on 03/16/2019) 10 tablet 0  . ferrous sulfate 325 (65 FE) MG tablet Take 1 tablet (325 mg total) by mouth 3 (three) times daily after meals. (Patient not taking: Reported on 03/16/2019)  3  . HYDROcodone-acetaminophen (NORCO) 7.5-325 MG tablet Take 1-2 tablets by mouth every 4 (four) hours as needed for moderate pain. (Patient not taking: Reported on 03/16/2019) 100 tablet 0  . hydrOXYzine (ATARAX/VISTARIL) 25 MG tablet Take 1 tablet (25 mg total) by mouth every 6 (six) hours as needed for itching. (Patient not taking: Reported on 03/16/2019) 12 tablet 0  . methocarbamol (ROBAXIN) 500 MG tablet Take 1 tablet (500 mg total) by mouth every 6 (six) hours as needed for muscle spasms. (Patient not taking: Reported on 03/16/2019) 40 tablet 0  . ondansetron (ZOFRAN) 4 MG tablet Take 4 mg by mouth every 8 (eight) hours as needed for nausea or vomiting.     . polyethylene glycol (MIRALAX / GLYCOLAX) packet Take 17 g by mouth 2 (two) times daily. (Patient not taking: Reported on 03/16/2019) 14 each 0  . triazolam (HALCION) 0.25 MG tablet Take 0.25 mg by mouth at bedtime.     . diphenhydrAMINE (BENADRYL) 25 MG tablet Take 2 tablets (50 mg total) by mouth every 6 (six) hours as needed for up to 5 days for itching or allergies. 30 tablet 0  . predniSONE (STERAPRED UNI-PAK 21 TAB) 10 MG (21) TBPK tablet Take by mouth daily. Take 6 tabs by mouth daily  for 2 days, then 5 tabs for 2 days, then 4 tabs for 2 days, then 3 tabs for 2 days, 2 tabs for 2 days, then  1 tab by mouth daily for 2 days 42 tablet 0      Objective:     BP 120/82 (BP Location: Left Arm, Cuff Size: Normal)   Pulse 86   Temp 98.1 F (36.7 C) (Oral)   Ht 5\' 10"  (1.778 m)   Wt 189 lb (85.7 kg)   SpO2 98%   BMI 27.12 kg/m   SpO2: 98 % RA  Very pleasant but somewhat anxious white male in no distress   HEENT: nl dentition, turbinates bilaterally, and oropharynx. Nl external ear canals without cough reflex/ L strabismus   NECK :  without JVD/Nodes/TM/ nl carotid upstrokes bilaterally   LUNGS: no acc muscle use,  Nl contour chest which is clear to A and P bilaterally without cough on insp or exp maneuvers   CV:  RRR  no s3 or murmur or increase in P2, and no edema   ABD:  soft and nontender with nl inspiratory excursion in the supine position. No bruits or organomegaly appreciated, bowel sounds nl  MS:  Nl gait/ ext warm without deformities, calf tenderness, cyanosis or clubbing No obvious joint restrictions   SKIN: warm and dry without lesions    NEURO:  alert, approp, nl sensorium with  no motor or cerebellar deficits apparent.       I personally reviewed   radiology impression as follows:   Chest CTa 02/23/2019  Result Impression    1. The ascending aorta measures approximately 4.5 cm in diameter, which is slightly larger relative to CT imaging in 2016 (4.2 cm). 2.  Within the pulmonary arterial distribution, there are several sites of segmental and subsegmental pulmonary emboli. 3. In both lungs there are multiple sites of focal, almost nodular, consolidation which is concerning for pneumonia. Given the contemporaneous finding of pulmonary emboli, infection with Covid 19 is high within the differential. 4. Diffuse, concentric thickening of the esophagus is suggestive of esophagitis.    CXR PA and Lateral:   03/16/2019 :    I personally reviewed images and   impression as follows:   wnl    Labs ordered/ reviewed:      Chemistry       Component Value Date/Time   NA 138 03/16/2019 1101   K 4.0 03/16/2019 1101   CL 104 03/16/2019 1101   CO2 29 03/16/2019 1101   BUN 18 03/16/2019 1101   CREATININE 1.16 03/16/2019 1101      Component Value Date/Time   CALCIUM 9.2 03/16/2019 1101        Lab Results  Component Value Date   WBC 6.3 03/16/2019   HGB 12.0 (L) 03/16/2019   HCT 35.5 (L) 03/16/2019   MCV 96.5 03/16/2019   PLT 235.0 03/16/2019       EOS                                                              0.8                                     03/16/2019        Lab Results  Component Value Date   TSH 0.63 03/16/2019     Lab Results  Component Value Date   PROBNP 100.0 03/16/2019       Lab Results  Component Value Date   ESRSEDRATE 2 03/16/2019       Labs ordered 03/16/2019  :  Troponin        Assessment   DOE (dyspnea on exertion) Onset early June 2020 with assoc chest tightness  - CTa 02/23/19 several segmental and subsegmental PEs with miconodular lung dz ? Etiology not seen on PA /lat today - 03/16/2019   Walked RA  2 laps @  approx 283ft each @ nl pace  stopped due to  End of study with some sob and chest tightness with sats 98%   - trial off  acei and max rx for gerd 03/16/2019    Symptoms at this point 2 weeks into rx for relativley small PE burden are disproportionate to objective findings and not clear to what extent this is actually a pulmonary  problem but pt does appear to have difficult to sort out respiratory symptoms of unknown origin for which  DDX  = almost all start with A and  include Adherence, Ace Inhibitors, Acid Reflux, Active Sinus Disease, Alpha 1 Antitripsin deficiency, Anxiety masquerading as Airways dz,  ABPA,  Allergy(esp in young), Aspiration (esp in elderly), Adverse effects of meds,  Active smoking or Vaping, A bunch of PE's/clot burden (a few small clots can't cause this syndrome unless there is already severe underlying pulm or vascular dz with poor reserve),  Anemia or  thyroid disorder, plus two Bs  = Bronchiectasis and Beta blocker use..and one C= CHF    Adherence is always the initial "prime suspect" and is a multilayered concern that requires a "trust but verify" approach in every patient - starting with knowing how to use medications, especially inhalers, correctly, keeping up with refills and understanding the fundamental difference between maintenance and prns vs those medications only taken for a very short course and then stopped and not refilled.  - return with all meds in hand using a trust but verify approach to confirm accurate Medication  Reconciliation The principal here is that until we are certain that the  patients are doing what we've asked, it makes no sense to ask them to do more.   ACEi adverse effects at the  top of the usual list of suspects and the only way to rule it out is a trial off > see a/p    ? Acid (or non-acid) GERD > always difficult to exclude as up to 75% of pts in some series report no assoc GI/ Heartburn symptoms> rec max (24h)  acid suppression and diet restrictions/ reviewed and instructions given in writing.   ? Anxiety/deconditioning  > usually at the bottom of this list of usual suspects but included   and may interfere with adherence and also interpretation of response or lack thereof to symptom management which can be quite subjective.   ? Anemia/ thyroid dz > excluded by today's labs  ? Allergy/ asthma > lack of associated rhinitis or nocturnal symptoms is against asthma although I note he does have a childhood history and has an elevated eosinophil level so this needs to be kept in the differential.  ? A  Bunch of PE's > he is focused on how much of the blood clots have resolved at this point and I am not sure how much they actually contributed to his symptoms as he really did not have the typical presentation for pulmonary embolism.  However, he has been adequately covered at this point with Eliquis and an  echocardiogram needs to be obtained to make sure he does not have any evidence of right heart strain to explain why he has ongoing dyspnea but other than that I do not recommend any change in therapy for the next 5 and half months.  At 6 months he will need repeat echo if his baseline echo is abnormal and also venous Dopplers and then perhaps consultation with hematologist regarding long-term anticoagulation as I believe PE was unprovoked.  ? CHF/ IHD > apparently this was already evaluated but not since the onset of his new symptoms.  His BNP though is not impressive and a troponin was obtained  just for completeness since he was complaining of ongoing chest tightness.       Essential hypertension Change from ACE inhibitor to ARB 03/16/2019 due to unexplained dry cough and chest tightness   In the best review of chronic cough to date ( NEJM 2016 375 [id number]) ,  ACEi are now felt to cause cough in up to  20% of pts which is a 4 fold increase from previous reports and does not include the variety of non-specific complaints we see in pulmonary clinic in pts on ACEi but previously attributed to another dx like  Copd/asthma and  include PNDS, throat and chest congestion, "bronchitis", unexplained dyspnea and chest tightness  and noct "strangling" sensations, and hoarseness, but also  atypical /refractory GERD symptoms like dysphagia and "bad heartburn"   The only way I know  to prove this is not an "ACEi Case" is a trial off ACEi x a minimum of 6 weeks then regroup.   rec [id number] one daily and in increase atenolol to 100 mg in the morning and 50 mg in p.m. as he is not adequately beta blocked at this point.  He monitors his blood pressure closely and will call me if this is not adequate.      >> f/u 2 weeks to regroup    Total time devoted to counseling  > 50 % of initial 60 min office visit:  review case with pt/Debbie by speakerphone/  directly observed portions of ambulatory 02  saturation study/ discussion of options/alternatives/ personally creating written customized instructions  in presence of pt  then going over those specific  Instructions directly with the pt including how to use all of the meds but in particular covering each new medication in detail and the difference between the maintenance= "automatic" meds and the prns using an action plan format for the latter (If this problem/symptom => do that organization reading Left to right).  Please see AVS from this visit for a full list of these instructions which I personally wrote for this pt and  are unique to this visit.       Christinia Gully, MD 03/16/2019

## 2019-03-17 NOTE — Progress Notes (Signed)
Spoke with pt and notified of results per Dr. Wert. Pt verbalized understanding and denied any questions. 

## 2019-03-21 ENCOUNTER — Ambulatory Visit (INDEPENDENT_AMBULATORY_CARE_PROVIDER_SITE_OTHER): Payer: Commercial Indemnity

## 2019-03-21 DIAGNOSIS — R0609 Other forms of dyspnea: Secondary | ICD-10-CM | POA: Diagnosis not present

## 2019-03-21 NOTE — Progress Notes (Signed)
Complete echocardiogram has been performed.  Jimmy Walsie Smeltz RDCS, RVT 

## 2019-03-22 ENCOUNTER — Telehealth: Payer: Self-pay | Admitting: Internal Medicine

## 2019-03-22 NOTE — Progress Notes (Signed)
Spoke with pt and notified of results per Dr. Wert. Pt verbalized understanding and denied any questions. 

## 2019-03-22 NOTE — Telephone Encounter (Signed)
Tanda Rockers, MD sent to Rosana Berger, [place]        Call patient : Luis Wilkinson is negative For any heart problem related to the blood clots - Be sure patient has/keeps f/u ov so we can go over all the details of this study and get a plan together moving forward - ok to move up f/u if not feeling better and wants to be seen sooner    Spoke with pt and notified of results per Dr. Melvyn Novas. Pt verbalized understanding and denied any questions.

## 2019-03-22 NOTE — Progress Notes (Signed)
LMTCB

## 2019-03-30 ENCOUNTER — Encounter: Payer: Self-pay | Admitting: Internal Medicine

## 2019-03-30 ENCOUNTER — Other Ambulatory Visit: Payer: Self-pay

## 2019-03-30 ENCOUNTER — Ambulatory Visit (INDEPENDENT_AMBULATORY_CARE_PROVIDER_SITE_OTHER): Payer: Commercial Indemnity | Admitting: Internal Medicine

## 2019-03-30 ENCOUNTER — Other Ambulatory Visit: Payer: Commercial Indemnity

## 2019-03-30 DIAGNOSIS — D721 Eosinophilia, unspecified: Secondary | ICD-10-CM

## 2019-03-30 DIAGNOSIS — R0609 Other forms of dyspnea: Secondary | ICD-10-CM

## 2019-03-30 DIAGNOSIS — I10 Essential (primary) hypertension: Secondary | ICD-10-CM | POA: Diagnosis not present

## 2019-03-30 LAB — CBC WITH DIFFERENTIAL/PLATELET
Basophils Absolute: 0.1 10*3/uL (ref 0.0–0.1)
Basophils Relative: 1.3 % (ref 0.0–3.0)
Eosinophils Absolute: 1.7 10*3/uL — ABNORMAL HIGH (ref 0.0–0.7)
Eosinophils Relative: 21.5 % — ABNORMAL HIGH (ref 0.0–5.0)
HCT: 35 % — ABNORMAL LOW (ref 39.0–52.0)
Hemoglobin: 11.8 g/dL — ABNORMAL LOW (ref 13.0–17.0)
Lymphocytes Relative: 22 % (ref 12.0–46.0)
Lymphs Abs: 1.7 10*3/uL (ref 0.7–4.0)
MCHC: 33.6 g/dL (ref 30.0–36.0)
MCV: 96.2 fl (ref 78.0–100.0)
Monocytes Absolute: 1.3 10*3/uL — ABNORMAL HIGH (ref 0.1–1.0)
Monocytes Relative: 16.4 % — ABNORMAL HIGH (ref 3.0–12.0)
Neutro Abs: 3 10*3/uL (ref 1.4–7.7)
Neutrophils Relative %: 38.8 % — ABNORMAL LOW (ref 43.0–77.0)
Platelets: 218 10*3/uL (ref 150.0–400.0)
RBC: 3.64 Mil/uL — ABNORMAL LOW (ref 4.22–5.81)
RDW: 13.3 % (ref 11.5–15.5)
WBC: 7.8 10*3/uL (ref 4.0–10.5)

## 2019-03-30 LAB — BASIC METABOLIC PANEL
BUN: 21 mg/dL (ref 6–23)
CO2: 27 mEq/L (ref 19–32)
Calcium: 8.9 mg/dL (ref 8.4–10.5)
Chloride: 103 mEq/L (ref 96–112)
Creatinine, Ser: 1.25 mg/dL (ref 0.40–1.50)
GFR: 58.44 mL/min — ABNORMAL LOW (ref >60.00)
Glucose, Bld: 90 mg/dL (ref 70–99)
Potassium: 3.9 mEq/L (ref 3.5–5.1)
Sodium: 139 mEq/L (ref 135–145)

## 2019-03-30 LAB — TROPONIN I (HIGH SENSITIVITY): High Sens Troponin I: 6 ng/L (ref 2–17)

## 2019-03-30 MED ORDER — APIXABAN 5 MG PO TABS: 5.0000 mg | ORAL_TABLET | Freq: Two times a day (BID) | ORAL | 2 refills | Status: DC

## 2019-03-30 NOTE — Patient Instructions (Addendum)
To get the most out of exercise, you need to be continuously aware that you are short of breath, but never out of breath, for 30 minutes daily. As you improve, it will actually be easier for you to do the same amount of exercise  in  30 minutes so always push to the level where you are short of breath.   Please schedule a follow up office visit in 6 weeks, call sooner if needed  - allergy profile on return if not established with allergist

## 2019-03-30 NOTE — Progress Notes (Signed)
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 Pulmonary Consult    Referred by Dr. Sheppard Coil for sob with exertion and abnormal CT scan. Patient reports that he has sob with exertion and any long distance walking.   Dyspnea:  Chest feels heavy at rest and p 50-100 ft worse assoc with sob no better since started eliquis  Cough: dry cough since onset of doe early June 2020 Sleep: fine lying down flat  SABA use: none rec Increase atenolol to where you take 100 mg in am and 50 mg (one half of 100) in pm  Change pantoprazole to 40 mg Take 30- 60 min before your first and last meals of the day  GERD diet   Stop lisinopril  Start Micardis 80-25 one daily in place of lisinopril      03/30/2019  f/u ov/Makinley Muscato re: unexplained sob p PE  Chief Complaint  Patient presents with  . Follow-up    Patient reports that he is doing about the same since last visit with mild improvement.   Dyspnea:  Can't keep up with gdaughter who is 6 riding a bike / had been limited by "bursa"  R hip  X 8 months prior to PE but never had the legs looks at for dvt  Cough: none Sleeping: able to lie flat SABA use: no 02: no   No obvious day to day or daytime variability or assoc excess/ purulent sputum or mucus plugs or hemoptysis or cp or chest tightness, subjective wheeze or overt sinus or hb symptoms.   Sleeping fine as above  without nocturnal  or early am  exacerbation  of respiratory  c/o's or need for noct saba. Also denies any obvious fluctuation of symptoms with weather or environmental changes or other aggravating or alleviating factors except as outlined above   No unusual exposure hx or h/o childhood pna or knowledge of premature birth.  Current Allergies, Complete Past Medical History, Past Surgical History, Family History, and Social History were reviewed in [employer] [facility] record.  ROS  The following are not active complaints unless bolded Hoarseness, sore throat, dysphagia, dental problems, itching, sneezing,  nasal congestion or discharge of excess mucus or purulent secretions, ear ache,   fever, chills, sweats, unintended wt loss or wt gain, classically pleuritic or exertional cp,  orthopnea pnd or arm/hand swelling  or leg swelling, presyncope, palpitations, abdominal pain, anorexia, nausea, vomiting, diarrhea  or change in bowel habits or change in bladder habits, change in stools or change in urine, dysuria, hematuria,  rash, arthralgias, visual complaints, headache, numbness, weakness or ataxia or problems with walking or coordination,  change in mood or  memory.        Current Meds  Medication Sig  . ACTEMRA 162 MG/0.9ML SOSY Inject 162 mg into the skin every 14 (fourteen) days.  Marland Kitchen atenolol (TENORMIN)  100 MG tablet One in am and a half in pm  . atorvastatin (LIPITOR) 10 MG tablet Take 10 mg by mouth daily.  . Cyanocobalamin (VITAMIN B 12) 100 MCG LOZG Take 100 mcg by mouth daily.  Marland Kitchen docusate sodium (COLACE) 100 MG capsule Take 1 capsule (100 mg total) by mouth 2 (two) times daily.  . Eliquis DVT/PE Starter Pack (ELIQUIS STARTER PACK) 5 MG TABS   . ergocalciferol (VITAMIN D2) 50000 units capsule Take 50,000 Units by mouth every Friday.   . ferrous sulfate 325 (65 FE) MG tablet Take 1 tablet (325 mg total) by mouth 3 (three) times daily after meals.  . gabapentin (NEURONTIN) 600 MG tablet Take 600 mg by mouth 2  (two) times daily.  . Glucosamine-Chondroit-Vit C-Mn (GLUCOSAMINE 1500 COMPLEX PO) Take 2 capsules by mouth daily.  Marland Kitchen HYDROcodone-acetaminophen (NORCO) 7.5-325 MG tablet Take 1-2 tablets by mouth every 4 (four) hours as needed for moderate pain.  . hydrOXYzine (ATARAX/VISTARIL) 25 MG tablet Take 1 tablet (25 mg total) by mouth every 6 (six) hours as needed for itching.  . leflunomide (ARAVA) 20 MG tablet Take 20 mg by mouth daily.  Marland Kitchen loratadine-pseudoephedrine (CLARITIN-D 24-HOUR) 10-240 MG 24 hr tablet Take 1 tablet by mouth daily as needed for allergies.  . methocarbamol (ROBAXIN) 500 MG tablet Take 1 tablet (500 mg total) by mouth every 6 (six) hours as needed for muscle spasms.  . Multiple Vitamin (MULTIVITAMIN) tablet Take 1 tablet by mouth every morning.  . ondansetron (ZOFRAN) 4 MG tablet Take 4 mg by mouth every 8 (eight) hours as needed for nausea or vomiting.   . pantoprazole (PROTONIX) 40 MG tablet Take 30- 60 min before your first and last meals of the day  . polyethylene glycol (MIRALAX / GLYCOLAX) packet Take 17 g by mouth 2 (two) times daily.  . predniSONE (DELTASONE) 5 MG tablet 5 mg 2 (two) times a day as needed.  Marland Kitchen telmisartan-hydrochlorothiazide (MICARDIS HCT) 80-25 MG tablet Take 1 tablet by mouth daily.  . temazepam (RESTORIL) 15 MG capsule   . triazolam (HALCION) 0.25 MG tablet Take 0.25 mg by mouth at bedtime.                     Past Medical History:  Diagnosis Date  . Aneurysm, ascending aorta (HCC)    followed br Dr Hortencia Pilar  . History of kidney stones   . Hypertension   . RA (rheumatoid arthritis) ([place])   . 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 Nl external ear canals without cough reflex   NECK :  without JVD/Nodes/TM/ nl carotid upstrokes bilaterally   LUNGS: no acc muscle use,  Nl contour chest which is clear to A and P bilaterally without cough on insp or exp maneuvers   CV:  RRR  no s3 or murmur or increase in P2, and no edema   ABD:  soft and nontender with nl inspiratory excursion in the supine position. No bruits or organomegaly appreciated, bowel sounds nl  MS:  Nl gait/ ext warm without deformities, calf tenderness, cyanosis or clubbing No obvious joint restrictions   SKIN: warm and dry without lesions    NEURO:  alert, approp, nl sensorium with  no motor or cerebellar deficits apparent.           Labs ordered/ reviewed:      Chemistry      Component Value Date/Time   NA 139 03/30/2019 1035   K 3.9 03/30/2019 1035   CL 103 03/30/2019 1035   CO2 27 03/30/2019 1035   BUN 21 03/30/2019 1035   CREATININE 1.25 03/30/2019 1035      Component Value Date/Time   CALCIUM 8.9 03/30/2019 1035        Lab Results  Component Value Date   WBC 7.8 03/30/2019   HGB 11.8 (L) 03/30/2019   HCT 35.0 (L) 03/30/2019   MCV 96.2 03/30/2019   PLT 218.0 03/30/2019       EOS                                                               1.7                                    03/30/2019      Lab Results  Component Value Date   TSH 0.63 03/16/2019     Lab Results  Component Value Date   PROBNP 100.0 03/16/2019       Lab Results  Component Value Date   ESRSEDRATE 2 03/16/2019              Assessment

## 2019-03-31 ENCOUNTER — Encounter: Payer: Self-pay | Admitting: Internal Medicine

## 2019-03-31 DIAGNOSIS — D721 Eosinophilia, unspecified: Secondary | ICD-10-CM | POA: Insufficient documentation

## 2019-03-31 HISTORY — DX: Eosinophilia, unspecified: D72.10

## 2019-03-31 NOTE — Assessment & Plan Note (Signed)
Onset of asthma as child   Degree is striking considering that he says he takes prednisone for RA - doubt he's using it regularly and also doubt any of his symptoms are asthma related in absence of assoc rhinitis or noct symptoms or variability but may need to consider allergy eval if not previously done.    I had an extended discussion with the patient  And wife Jackelyn Poling by speaker phone reviewing all relevant studies completed to date and  lasting 15 to 20 minutes of a 25 minute visit  which included directly observing ambulatory 02 saturation study documented in a/p section of  today's  office note.  Each maintenance medication was reviewed in detail including most importantly the difference between maintenance and prns and under what circumstances the prns are to be triggered using an action plan format that is not reflected in the computer generated alphabetically organized AVS.     Please see AVS for specific instructions unique to this visit that I personally wrote and verbalized to the the pt in detail and then reviewed with pt  by my nurse highlighting any changes in therapy recommended at today's visit .

## 2019-03-31 NOTE — Assessment & Plan Note (Signed)
Change from ACE inhibitor to ARB 03/16/2019 due to unexplained dry cough and chest tightness   Adequate control on present rx, reviewed in detail with pt > no change in rx needed    Although even in retrospect it may not be clear the ACEi contributed to the pt's symptoms,  Pt improved off them and adding them back at this point or in the future would risk confusion in interpretation of non-specific respiratory symptoms to which this patient is prone  ie  Better not to muddy the waters here.

## 2019-04-10 ENCOUNTER — Ambulatory Visit: Payer: Commercial Indemnity | Admitting: Cardiology

## 2019-05-11 ENCOUNTER — Ambulatory Visit: Payer: Commercial Indemnity | Admitting: Internal Medicine

## 2019-05-15 ENCOUNTER — Ambulatory Visit: Payer: Commercial Indemnity | Admitting: Internal Medicine

## 2019-05-23 ENCOUNTER — Other Ambulatory Visit: Payer: Self-pay

## 2019-05-23 ENCOUNTER — Encounter: Payer: Self-pay | Admitting: Internal Medicine

## 2019-05-23 ENCOUNTER — Ambulatory Visit (INDEPENDENT_AMBULATORY_CARE_PROVIDER_SITE_OTHER): Payer: Commercial Indemnity | Admitting: Internal Medicine

## 2019-05-23 DIAGNOSIS — I2782 Chronic pulmonary embolism: Secondary | ICD-10-CM

## 2019-05-23 DIAGNOSIS — R0600 Dyspnea, unspecified: Secondary | ICD-10-CM

## 2019-05-23 DIAGNOSIS — I10 Essential (primary) hypertension: Secondary | ICD-10-CM | POA: Diagnosis not present

## 2019-05-23 DIAGNOSIS — D7219 Other eosinophilia: Secondary | ICD-10-CM

## 2019-05-23 DIAGNOSIS — R0609 Other forms of dyspnea: Secondary | ICD-10-CM

## 2019-05-23 NOTE — Progress Notes (Signed)
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 Pulmonary Consult    Referred by Dr. Sheppard Coil for sob with exertion and abnormal CT scan. Patient reports that he has sob with exertion and any long distance walking.   Dyspnea:  Chest feels heavy at rest and p 50-100 ft worse assoc with sob no better since started eliquis  Cough: dry cough since onset of doe early June 2020 Sleep: fine lying down flat  SABA use: none rec Increase atenolol to where you take 100 mg in am and 50 mg (one half of 100) in pm  Change pantoprazole to 40 mg Take 30- 60 min before your first and last meals of the day  GERD diet   Stop lisinopril  Start Micardis 80-25 one daily in place of lisinopril      03/30/2019  f/u ov/Luis Wilkinson re: unexplained sob p PE  Chief Complaint  Patient presents with  . Follow-up    Patient reports that he is doing about the same since last visit with mild improvement.   Dyspnea:  Can't keep up with gdaughter who is 6 riding a bike / had been limited by "bursa"  R hip  X 8 months prior to PE but never had the legs looks at for dvt  Cough: none Sleeping: able to lie flat SABA use: no 02: no rec  To get the most out of exercise, you need to be continuously aware that you are short of breath, but never out of breath, for 30 minutes daily.        Dr Shaune Leeks eval not able to take shots around 2015 so did not return     05/23/2019  f/u ov/Luis Wilkinson re: improving ex tol back to baseline  Chief Complaint  Patient presents with  . Follow-up    Breathing has improved back to his normal baseline and no new co's.   Dyspnea:  Bicycle outdoor including hills Cough: none  Sleeping: able to lie flat  SABA use: none 02: none    No obvious day to day or daytime variability or assoc excess/ purulent sputum or mucus plugs or hemoptysis or cp or chest tightness, subjective wheeze or overt sinus or hb symptoms.   Sleeping as above  without nocturnal  or early am exacerbation  of respiratory  c/o's or need for noct saba. Also denies any obvious fluctuation of symptoms with weather or environmental changes or other aggravating or alleviating factors except as outlined above   No unusual exposure hx or h/o childhood pna or knowledge of premature birth.  Current Allergies, Complete Past Medical History, Past Surgical History, Family History, and Social History were reviewed in [employer] record.  ROS  The following are not active complaints unless bolded Hoarseness, sore throat, dysphagia, dental problems, itching, sneezing,  nasal congestion or discharge of excess mucus or purulent  secretions, ear ache,   fever, chills, sweats, unintended wt loss or wt gain, classically pleuritic or exertional cp,  orthopnea pnd or arm/hand swelling  or leg swelling, presyncope, palpitations, abdominal pain, anorexia, nausea, vomiting, diarrhea  or change in bowel habits or change in bladder habits, change in stools or change in urine, dysuria, hematuria,  rash, arthralgias, visual complaints, headache, numbness, weakness or ataxia or problems with walking or coordination,  change in mood or  memory.        Current Meds  Medication Sig  . apixaban (ELIQUIS) 5 MG TABS tablet Take 1 tablet (5 mg total) by mouth 2 (two) times daily.  Marland Kitchen atenolol (TENORMIN) 100 MG tablet One in am and a half in pm  . atorvastatin (LIPITOR)  10 MG tablet Take 10 mg by mouth daily.  . ergocalciferol (VITAMIN D2) 50000 units capsule Take 50,000 Units by mouth every Friday.   . gabapentin (NEURONTIN) 600 MG tablet Take 600 mg by mouth 2 (two) times daily.  . Glucosamine-Chondroit-Vit C-Mn (GLUCOSAMINE 1500 COMPLEX PO) Take 2 capsules by mouth daily.  Marland Kitchen leflunomide (ARAVA) 20 MG tablet Take 20 mg by mouth daily.  Marland Kitchen loratadine-pseudoephedrine (CLARITIN-D 24-HOUR) 10-240 MG 24 hr tablet Take 1 tablet by mouth daily as needed for allergies.  . Multiple Vitamin (MULTIVITAMIN) tablet Take 1 tablet by mouth every morning.  . ondansetron (ZOFRAN) 4 MG tablet Take 4 mg by mouth every 8 (eight) hours as needed for nausea or vomiting.   . pantoprazole (PROTONIX) 40 MG tablet Take 30- 60 min before your first and last meals of the day  . predniSONE (DELTASONE) 5 MG tablet 5 mg 2 (two) times a day as needed.  Marland Kitchen telmisartan-hydrochlorothiazide (MICARDIS HCT) 80-25 MG tablet Take 1 tablet by mouth daily.  . temazepam (RESTORIL) 15 MG capsule                        Past Medical History:  Diagnosis Date  . Aneurysm, ascending aorta (HCC)    followed br Dr Hortencia Pilar  . History of kidney stones   . Hypertension   . RA (rheumatoid arthritis) ([place])   . 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 Nl external ear canals without cough reflex   NECK :  without JVD/Nodes/TM/ nl carotid upstrokes bilaterally   LUNGS: no acc muscle use,  Nl contour chest which is clear to A and P bilaterally without cough on insp or exp maneuvers   CV:  RRR  no s3 or murmur or increase in P2, and no edema    ABD:  soft and nontender with nl inspiratory excursion in the supine position. No bruits or organomegaly appreciated, bowel sounds nl  MS:  Nl gait/ ext warm without deformities, calf tenderness, cyanosis or clubbing No obvious joint restrictions   SKIN: warm and dry without lesions    NEURO:  alert, approp, nl sensorium with  no motor or cerebellar deficits apparent.  Labs ordered/ reviewed:      Chemistry      Component Value Date/Time   NA 139 03/30/2019 1035   K 3.9 03/30/2019 1035   CL 103 03/30/2019 1035   CO2 27 03/30/2019 1035   BUN 21 03/30/2019 1035   CREATININE 1.25 03/30/2019 1035      Component Value Date/Time   CALCIUM 8.9 03/30/2019 1035        Lab Results  Component Value Date   WBC 7.8 03/30/2019   HGB 11.8 (L) 03/30/2019   HCT 35.0 (L) 03/30/2019   MCV 96.2 03/30/2019   PLT 218.0 03/30/2019       EOS                                                               1.7                                    03/30/2019      Lab Results  Component Value Date   TSH 0.63 03/16/2019     Lab Results  Component Value Date   PROBNP 100.0 03/16/2019       Lab Results  Component Value Date   ESRSEDRATE 2 03/16/2019              Assessment                 Luis Wilkinson, male    DOB: [birth year],   MRN: [id number]   Brief patient profile:  45 yowm quit smoking 1995  Asthma as child in elementary school > er sev times mostly outgrew and did fine after that with no limitations but 2012 bad arthritis in hands 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 Pulmonary Consult    Referred by Dr. Sheppard Coil for sob with exertion and  abnormal CT scan. Patient reports that he has sob with exertion and any long distance walking.   Dyspnea:  Chest feels heavy at rest and p 50-100 ft worse assoc with sob no better since started eliquis  Cough: dry cough since onset of doe early June 2020 Sleep: fine lying down flat  SABA use: none rec Increase atenolol to where you take 100 mg in am and 50 mg (one half of 100) in pm  Change pantoprazole to 40 mg Take 30- 60 min before your first and last meals of the day  GERD diet   Stop lisinopril  Start Micardis 80-25 one daily in place of lisinopril      03/30/2019  f/u ov/Gustavus Haskin re: unexplained sob p PE  Chief Complaint  Patient presents with  . Follow-up    Patient reports that he is doing about the same since last visit with mild improvement.   Dyspnea:  Can't keep up with gdaughter who is 6 riding a bike / had been limited by "bursa"  R hip  X 8 months prior to PE but never had the legs looked at for dvt  Cough: none Sleeping: able to lie flat SABA use: no 02: no rec To get the most out of exercise, you need to be continuously aware that you are short of breath, but never out of breath, for 30 minutes daily. As you improve, it will actually be easier for you to do the same amount of exercise  in  30 minutes so always push to the level where you are short of breath.       05/23/2019  f/u ov/Wendall Isabell re: s/p PE with improving doe  Chief Complaint  Patient presents with  . Follow-up    Breathing has improved back to his normal baseline and no new co's.   Dyspnea:  Not limited by breathing from desired activities   Cough: none Sleeping: fine flat SABA use: none 02: none    No obvious day to day or daytime variability or assoc excess/ purulent sputum or mucus plugs or hemoptysis or cp or chest tightness, subjective wheeze or overt sinus or hb symptoms.   Sleeping as above  without nocturnal  or early am exacerbation  of respiratory  c/o's or need for noct saba. Also denies  any obvious fluctuation of symptoms with weather or environmental changes or other aggravating or alleviating factors except as outlined above   No unusual exposure hx or h/o childhood pna/ asthma or knowledge of premature birth.  Current Allergies, Complete Past Medical History, Past Surgical History, Family History, and Social History were reviewed in [employer] record.  ROS  The following are not active complaints unless bolded Hoarseness, sore throat, dysphagia, dental problems, itching, sneezing,  nasal congestion or discharge of excess mucus or purulent secretions, ear ache,   fever, chills, sweats, unintended wt loss or wt gain, classically pleuritic or exertional cp,  orthopnea pnd or arm/hand swelling  or leg swelling, presyncope, palpitations, abdominal pain, anorexia, nausea, vomiting, diarrhea  or change in bowel habits or change in bladder habits, change in stools or change in urine, dysuria, hematuria,  rash, arthralgias, visual complaints, headache, numbness, weakness or ataxia or problems with walking or coordination,  change in mood or  memory.        Current Meds  Medication Sig  . apixaban (ELIQUIS) 5 MG TABS tablet Take 1 tablet (5 mg total) by mouth 2 (two) times daily.  Marland Kitchen atenolol (TENORMIN) 100 MG tablet One in am and a half in pm  . atorvastatin (LIPITOR) 10 MG tablet Take 10 mg by mouth daily.  . ergocalciferol (VITAMIN D2) 50000 units capsule Take 50,000 Units by mouth every Friday.   . gabapentin (NEURONTIN) 600 MG tablet Take 600 mg by mouth 2 (two) times daily.  . Glucosamine-Chondroit-Vit C-Mn (GLUCOSAMINE 1500 COMPLEX PO) Take 2 capsules by mouth daily.  Marland Kitchen leflunomide (ARAVA) 20 MG tablet Take 20 mg by mouth daily.  Marland Kitchen loratadine-pseudoephedrine (CLARITIN-D 24-HOUR) 10-240 MG 24 hr tablet Take 1 tablet by mouth daily as needed for allergies.  . Multiple Vitamin (MULTIVITAMIN) tablet Take 1 tablet by mouth every morning.  . ondansetron  (ZOFRAN) 4 MG tablet Take 4 mg by mouth every 8 (eight) hours as needed for nausea or vomiting.   . pantoprazole (PROTONIX) 40 MG tablet Take 30- 60 min before your first and last meals of the day  . predniSONE (DELTASONE) 5 MG tablet 5 mg 2 (two) times a day as needed.  Marland Kitchen  telmisartan-hydrochlorothiazide (MICARDIS HCT) 80-25 MG tablet Take 1 tablet by mouth daily.  . temazepam (RESTORIL) 15 MG capsule                      Past Medical History:  Diagnosis Date  . Aneurysm, ascending aorta (HCC)    followed br Dr Hortencia Pilar  . History of kidney stones   . Hypertension   . RA (rheumatoid arthritis) ([place])   . 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    NECK :  without JVD/Nodes/TM/ nl carotid upstrokes bilaterally   LUNGS: no acc muscle use,  Nl contour chest which is clear to A and P bilaterally without cough on insp or exp maneuvers   CV:  RRR  no s3 or murmur or increase in P2, and no edema   ABD:  soft and nontender with nl inspiratory excursion in the supine position. No bruits or organomegaly appreciated, bowel sounds nl  MS:  Nl gait/ ext warm without deformities, calf tenderness, cyanosis or clubbing No obvious joint restrictions   SKIN: warm and dry without lesions    NEURO:  alert, approp, nl sensorium with  no motor or cerebellar deficits apparent.                         Assessment

## 2019-05-23 NOTE — Patient Instructions (Signed)
I recommend a minimum of 6 months of therapy for pulmonary embolism    >>>  After 6 months would do venous dopplers first to be sure there is no ongoing leg clot and if negative, then ok to stop the eliquis and review/ complete your work up for hypercoagulability and if all neg then either medium dose eliquis  prevent clots or one aspirin a day Dr Sheppard Coil and leave up to him as to whether to send you to a blood clotting specialist due to your mother's history.

## 2019-05-24 ENCOUNTER — Encounter: Payer: Self-pay | Admitting: Internal Medicine

## 2019-05-24 DIAGNOSIS — I2699 Other pulmonary embolism without acute cor pulmonale: Secondary | ICD-10-CM | POA: Insufficient documentation

## 2019-05-24 HISTORY — DX: Other pulmonary embolism without acute cor pulmonale: I26.99

## 2019-05-24 NOTE — Assessment & Plan Note (Signed)
Onset early June 2020 with assoc chest tightness  - CTa 02/23/19 several segmental and subsegmental PE with micronodular lung dz ? Etiology with no venous dopplers  - 03/16/2019   Walked RA  2 laps @  approx 217ft each @ nl pace  stopped due to  End of study with some sob and chest tightness with sats 98%   - trial off acei and max rx for gerd 03/16/2019  - Echo 03/21/2019   No PH  - 03/30/2019   Walked RA  3 laps @  approx 247ft each @ fast pace  stopped due to end of study, mild sob but sats 99%     Not able to reproduce his symptoms here and can't understand how he can't keep up with 44 y old on bike but for now rec reconditioning and continue xopenex and f/u in 6 weeks.

## 2019-05-24 NOTE — Assessment & Plan Note (Signed)
Onset of asthma as child / [facility] eval around 2015   Takes prednisone for RA so may be why he has no symptoms of allergy but at risk if tapers > f/u allergy prn   I had an extended discussion with the patient reviewing all relevant studies completed to date and  lasting 15 to 20 minutes of a 25 minute summary final f/u office visit    Each maintenance medication was reviewed in detail including most importantly the difference between maintenance and prns and under what circumstances the prns are to be triggered using an action plan format that is not reflected in the computer generated alphabetically organized AVS.     Please see AVS for specific instructions unique to this visit that I personally wrote and verbalized to the the pt in detail and then reviewed with pt  by my nurse highlighting any  changes in therapy recommended at today's visit to their plan of care.

## 2019-05-24 NOTE — Assessment & Plan Note (Signed)
Onset early June 2020 with assoc chest tightness  - CTa 02/23/19 several segmental and subsegmental PE with micronodular lung dz ? Etiology with no venous dopplers  - 03/16/2019   Walked RA  2 laps @  approx 255ft each @ nl pace  stopped due to  End of study with some sob and chest tightness with sats 98%   - trial off acei and max rx for gerd 03/16/2019  - Echo 03/21/2019   No PH  - 03/30/2019   Walked RA  3 laps @  approx 23ft each @ fast pace  stopped due to end of study, mild sob but sats 99%       No further w/u needed at this point as resolved to his satisfaction.

## 2019-05-24 NOTE — Assessment & Plan Note (Addendum)
CTa 02/23/19 pos for PE 02/23/19 Venous dopplers not done, echo ok    After 6 months would do venous dopplers first to be sure there is no ongoing leg clot and if negative, then ok to stop the eliquis and review/ complete your work up for hypercoagulability and if all neg then either medium dose eliquis  prevent clots or one aspirin a day Dr Sheppard Coil and leave up to him as to whether to send you to a blood clotting specialist due to your mother's history.

## 2019-05-24 NOTE — Assessment & Plan Note (Signed)
Change from ACE inhibitor to ARB 03/16/2019 due to unexplained dry cough and chest tightness > completely resolved as of 05/23/2019   Adequate control on present rx, reviewed in detail with pt > no change in rx needed    Refills for ARB per PCP @ next ov/ we can refll in meantime prn

## 2019-06-26 ENCOUNTER — Other Ambulatory Visit: Payer: Self-pay | Admitting: Internal Medicine

## 2019-06-26 NOTE — Telephone Encounter (Signed)
Refill sent. Called and spoke with pt letting him know this had been done and he verbalized understanding. Nothing further needed.

## 2019-09-20 ENCOUNTER — Other Ambulatory Visit: Payer: Self-pay | Admitting: Internal Medicine

## 2019-10-10 DIAGNOSIS — S73199A Other sprain of unspecified hip, initial encounter: Secondary | ICD-10-CM

## 2019-10-10 HISTORY — DX: Other sprain of unspecified hip, initial encounter: S73.199A

## 2019-10-18 ENCOUNTER — Other Ambulatory Visit: Payer: Self-pay | Admitting: Internal Medicine

## 2019-10-26 DIAGNOSIS — K6389 Other specified diseases of intestine: Secondary | ICD-10-CM

## 2019-10-26 HISTORY — DX: Other specified diseases of intestine: K63.89

## 2019-11-14 ENCOUNTER — Other Ambulatory Visit: Payer: Self-pay | Admitting: Internal Medicine

## 2019-11-15 ENCOUNTER — Ambulatory Visit: Payer: Commercial Indemnity | Admitting: Internal Medicine

## 2019-12-18 ENCOUNTER — Encounter: Payer: Self-pay | Admitting: Internal Medicine

## 2019-12-18 ENCOUNTER — Other Ambulatory Visit: Payer: Self-pay

## 2019-12-18 ENCOUNTER — Ambulatory Visit (INDEPENDENT_AMBULATORY_CARE_PROVIDER_SITE_OTHER): Payer: Medicare Other | Admitting: Internal Medicine

## 2019-12-18 VITALS — BP 120/78 | HR 77 | Temp 98.8°F | Ht 70.0 in | Wt 186.0 lb

## 2019-12-18 DIAGNOSIS — N320 Bladder-neck obstruction: Secondary | ICD-10-CM | POA: Diagnosis not present

## 2019-12-18 DIAGNOSIS — E559 Vitamin D deficiency, unspecified: Secondary | ICD-10-CM

## 2019-12-18 DIAGNOSIS — E538 Deficiency of other specified B group vitamins: Secondary | ICD-10-CM

## 2019-12-18 DIAGNOSIS — E785 Hyperlipidemia, unspecified: Secondary | ICD-10-CM | POA: Diagnosis not present

## 2019-12-18 DIAGNOSIS — J3489 Other specified disorders of nose and nasal sinuses: Secondary | ICD-10-CM

## 2019-12-18 DIAGNOSIS — I2782 Chronic pulmonary embolism: Secondary | ICD-10-CM

## 2019-12-18 DIAGNOSIS — I10 Essential (primary) hypertension: Secondary | ICD-10-CM | POA: Diagnosis not present

## 2019-12-18 DIAGNOSIS — J309 Allergic rhinitis, unspecified: Secondary | ICD-10-CM

## 2019-12-18 DIAGNOSIS — G4700 Insomnia, unspecified: Secondary | ICD-10-CM

## 2019-12-18 DIAGNOSIS — H49882 Other paralytic strabismus, left eye: Secondary | ICD-10-CM | POA: Insufficient documentation

## 2019-12-18 DIAGNOSIS — Z9049 Acquired absence of other specified parts of digestive tract: Secondary | ICD-10-CM

## 2019-12-18 DIAGNOSIS — Z114 Encounter for screening for human immunodeficiency virus [HIV]: Secondary | ICD-10-CM

## 2019-12-18 DIAGNOSIS — H02402 Unspecified ptosis of left eyelid: Secondary | ICD-10-CM | POA: Insufficient documentation

## 2019-12-18 DIAGNOSIS — R739 Hyperglycemia, unspecified: Secondary | ICD-10-CM | POA: Diagnosis not present

## 2019-12-18 DIAGNOSIS — M0600 Rheumatoid arthritis without rheumatoid factor, unspecified site: Secondary | ICD-10-CM

## 2019-12-18 HISTORY — DX: Rheumatoid arthritis without rheumatoid factor, unspecified site: M06.00

## 2019-12-18 HISTORY — DX: Unspecified ptosis of left eyelid: H02.402

## 2019-12-18 HISTORY — DX: Other specified disorders of nose and nasal sinuses: J34.89

## 2019-12-18 HISTORY — DX: Insomnia, unspecified: G47.00

## 2019-12-18 HISTORY — DX: Other paralytic strabismus, left eye: H49.882

## 2019-12-18 HISTORY — DX: Acquired absence of other specified parts of digestive tract: Z90.49

## 2019-12-18 HISTORY — DX: Allergic rhinitis, unspecified: J30.9

## 2019-12-18 LAB — HEPATIC FUNCTION PANEL
ALT: 13 U/L (ref 0–53)
AST: 19 U/L (ref 0–37)
Albumin: 4 g/dL (ref 3.5–5.2)
Alkaline Phosphatase: 93 U/L (ref 39–117)
Bilirubin, Direct: 0.1 mg/dL (ref 0.0–0.3)
Total Bilirubin: 0.5 mg/dL (ref 0.2–1.2)
Total Protein: 6.3 g/dL (ref 6.0–8.3)

## 2019-12-18 LAB — BASIC METABOLIC PANEL
BUN: 19 mg/dL (ref 6–23)
CO2: 33 mEq/L — ABNORMAL HIGH (ref 19–32)
Calcium: 9.4 mg/dL (ref 8.4–10.5)
Chloride: 103 mEq/L (ref 96–112)
Creatinine, Ser: 1.17 mg/dL (ref 0.40–1.50)
GFR: 62.93 mL/min (ref >60.00)
Glucose, Bld: 117 mg/dL — ABNORMAL HIGH (ref 70–99)
Potassium: 3.9 mEq/L (ref 3.5–5.1)
Sodium: 141 mEq/L (ref 135–145)

## 2019-12-18 LAB — VITAMIN B12: Vitamin B-12: 566 pg/mL (ref 211–911)

## 2019-12-18 LAB — CBC WITH DIFFERENTIAL/PLATELET
Basophils Absolute: 0.1 10*3/uL (ref 0.0–0.1)
Basophils Relative: 1.4 % (ref 0.0–3.0)
Eosinophils Absolute: 0.5 10*3/uL (ref 0.0–0.7)
Eosinophils Relative: 7.7 % — ABNORMAL HIGH (ref 0.0–5.0)
HCT: 34.2 % — ABNORMAL LOW (ref 39.0–52.0)
Hemoglobin: 11.6 g/dL — ABNORMAL LOW (ref 13.0–17.0)
Lymphocytes Relative: 15.1 % (ref 12.0–46.0)
Lymphs Abs: 1 10*3/uL (ref 0.7–4.0)
MCHC: 33.9 g/dL (ref 30.0–36.0)
MCV: 95.4 fl (ref 78.0–100.0)
Monocytes Absolute: 1.2 10*3/uL — ABNORMAL HIGH (ref 0.1–1.0)
Monocytes Relative: 16.8 % — ABNORMAL HIGH (ref 3.0–12.0)
Neutro Abs: 4.1 10*3/uL (ref 1.4–7.7)
Neutrophils Relative %: 59 % (ref 43.0–77.0)
Platelets: 277 10*3/uL (ref 150.0–400.0)
RBC: 3.58 Mil/uL — ABNORMAL LOW (ref 4.22–5.81)
RDW: 12.5 % (ref 11.5–15.5)
WBC: 6.9 10*3/uL (ref 4.0–10.5)

## 2019-12-18 LAB — LDL CHOLESTEROL, DIRECT: Direct LDL: 71 mg/dL

## 2019-12-18 LAB — LIPID PANEL
Cholesterol: 136 mg/dL (ref 0–200)
HDL: 34.3 mg/dL — ABNORMAL LOW (ref >39.00)
NonHDL: 102
Total CHOL/HDL Ratio: 4
Triglycerides: 201 mg/dL — ABNORMAL HIGH (ref 0.0–149.0)
VLDL: 40.2 mg/dL — ABNORMAL HIGH (ref 0.0–40.0)

## 2019-12-18 LAB — TSH: TSH: 0.67 u[IU]/mL (ref 0.35–4.50)

## 2019-12-18 LAB — PSA: PSA: 0.57 ng/mL (ref 0.10–4.00)

## 2019-12-18 LAB — HEMOGLOBIN A1C: Hgb A1c MFr Bld: 5.5 % (ref 4.6–6.5)

## 2019-12-18 LAB — VITAMIN D 25 HYDROXY (VIT D DEFICIENCY, FRACTURES): VITD: 52 ng/mL (ref 30.00–100.00)

## 2019-12-18 MED ORDER — TEMAZEPAM 15 MG PO CAPS: 15.0000 mg | ORAL_CAPSULE | Freq: Every evening | ORAL | 5 refills | Status: DC | PRN

## 2019-12-18 MED ORDER — MUPIROCIN CALCIUM 2 % NA OINT: 1.0000 "application " | TOPICAL_OINTMENT | Freq: Two times a day (BID) | NASAL | 2 refills | Status: DC

## 2019-12-18 NOTE — Assessment & Plan Note (Addendum)
stable overall by history and exam, recent data reviewed with pt, and pt to continue medical treatment as before,  to f/u any worsening symptoms or concerns  I spent 45 minutes in preparing to see the patient by review of recent labs, imaging and procedures, obtaining and reviewing separately obtained history, communicating with the patient and family or caregiver, ordering medications, tests or procedures, and documenting clinical information in the EHR including the differential Dx, treatment, and any further evaluation and other management of htn, alergies, nasal sore, RA, and insomnia

## 2019-12-18 NOTE — Assessment & Plan Note (Signed)
To continue claritin D prn

## 2019-12-18 NOTE — Assessment & Plan Note (Signed)
For med refill prn,

## 2019-12-18 NOTE — Assessment & Plan Note (Signed)
Now off eliquis, to f/u any worsening symptoms or concerns

## 2019-12-18 NOTE — Assessment & Plan Note (Signed)
F/u rheum as planned

## 2019-12-18 NOTE — Assessment & Plan Note (Signed)
For mupirocin asd

## 2019-12-18 NOTE — Patient Instructions (Signed)
Please take all new medication as prescribed- the nasal antibiotic  Please continue all other medications as before, and refills have been done if requested - the temazepam, which for some reason could not be sent to the pharmacy on the computer as we normally do, so this is given in hardcopy  Please have the pharmacy call with any other refills you may need.  Please continue your efforts at being more active, low cholesterol diet, and weight control.  You are otherwise up to date with prevention measures today.  Please keep your appointments with your specialists as you may have planned  Please go to the LAB at the blood drawing area for the tests to be done  You will be contacted by phone if any changes need to be made immediately.  Otherwise, you will receive a letter about your results with an explanation, but please check with MyChart first.  Please remember to sign up for MyChart if you have not done so, as this will be important to you in the future with finding out test results, communicating by private email, and scheduling acute appointments online when needed.  Please make an Appointment to return in 6 months, or sooner if needed

## 2019-12-18 NOTE — Progress Notes (Signed)
Subjective:    Patient ID: Luis Wilkinson, male    DOB: November 23, 1955, 64 y.o.   MRN: [id number]  HPI  Here for wellness and establish as new pt;  Overall doing ok;  Pt denies Chest pain, worsening SOB, DOE, wheezing, orthopnea, PND, worsening LE edema, palpitations, dizziness or syncope.  Pt denies neurological change such as new headache, facial or extremity weakness.  Pt denies polydipsia, polyuria, or low sugar symptoms. Pt states overall good compliance with treatment and medications, good tolerability, and has been trying to follow appropriate diet.  Pt denies worsening depressive symptoms, suicidal ideation or panic. No fever, night sweats, wt loss, loss of appetite, or other constitutional symptoms.  Pt states good ability with ADL's, has low fall risk, home safety reviewed and adequate, no other significant changes in hearing or vision, and only occasionally active with exercise.  Now off eliquis after PE last yr.Also with nasl sores with worsening allergies over the past few wks.Has f/u rheum next wk,  ALso with chronic insomnia, needs refill Past Medical History:  Diagnosis Date  . Aneurysm, ascending aorta (HCC)    followed br Dr Hortencia Pilar  . History of kidney stones   . History of partial colectomy 12/18/2019  . Hypertension   . RA (rheumatoid arthritis) ([place])   . 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 Seronegative rheumatoid arthritis ([place]) 12/18/2019   Past Surgical History:  Procedure Laterality Date  . CARDIAC CATHETERIZATION  Feb 2015    [place]   per pt normal coronary arteries  . CATARACT EXTRACTION W/ INTRAOCULAR LENS IMPLANT Right Mar 2014  . CYSTOSCOPY W/ URETEROSCOPY W/ LITHOTRIPSY    . EXCISION SUBDERMAL NECK TUMOR  2010   benign  . KNEE ARTHROSCOPY W/ MENISCECTOMY Right 01-09-2015  . LEFT ELBOW RECONSTRUCTION  2010  . PARTIAL KNEE ARTHROPLASTY Right 08/08/2015   Procedure: RIGHT UNICOMPARTMENTAL KNEE REVISION PLASTIC;   Surgeon: Paralee Cancel, MD;  Location: [facility];  Service: Orthopedics;  Laterality: Right;  . PARTIAL KNEE ARTHROPLASTY Right 03/02/2016   Procedure: UNICOMPARTMENTAL RIGHT KNEE REVISION;  Surgeon: Paralee Cancel, MD;  Location: [facility];  Service: Orthopedics;  Laterality: Right;  . REPLACEMENT UNICONDYLAR JOINT KNEE Right 06-20-2015  . STRABISMUS SURGERY Left x6   last one --Age 48    reports that he quit smoking about 25 years ago. His smoking use included cigarettes. He has a 5.00 pack-year smoking history. He has never used smokeless tobacco. He reports that he does not drink alcohol or use drugs. family history includes Congestive Heart Failure in his father and mother. Allergies  Allergen Reactions  . Dilaudid [Hydromorphone] Shortness Of Breath and Other (See Comments)    Chest tight  . Plaquenil [Hydroxychloroquine Sulfate]    Current Outpatient Medications on File Prior to Visit  Medication Sig Dispense Refill  . atenolol (TENORMIN) 100 MG tablet One in am and a half in pm    . atorvastatin (LIPITOR) 10 MG tablet Take 10 mg by mouth daily.    . ergocalciferol (VITAMIN D2) 50000 units capsule Take 50,000 Units by mouth every Friday.     . gabapentin (NEURONTIN) 600 MG tablet Take 600 mg by mouth 2 (two) times daily.    . Glucosamine-Chondroit-Vit C-Mn (GLUCOSAMINE 1500 COMPLEX PO) Take 2 capsules by mouth daily.    Marland Kitchen leflunomide (ARAVA) 20 MG tablet Take 20 mg by mouth daily.    Marland Kitchen loratadine-pseudoephedrine (CLARITIN-D 24-HOUR) 10-240 MG  24 hr tablet Take 1 tablet by mouth daily as needed for allergies.    . Multiple Vitamin (MULTIVITAMIN) tablet Take 1 tablet by mouth every morning.    . pantoprazole (PROTONIX) 40 MG tablet Take 30- 60 min before your first and last meals of the day 60 tablet 11  . predniSONE (DELTASONE) 5 MG tablet 5 mg 2 (two) times a day as needed.    Marland Kitchen telmisartan-hydrochlorothiazide (MICARDIS HCT) 80-25 MG tablet TAKE 1 TABLET BY MOUTH ONCE  DAILY 30 tablet 1  . Turmeric 500 MG CAPS Take by mouth.     No current facility-administered medications on file prior to visit.   Review of Systems All otherwise neg per pt     Objective:wk.   Physical Exam BP 120/78 (BP Location: Left Arm, Patient Position: Sitting, Cuff Size: Large)   Pulse 77   Temp 98.8 F (37.1 C) (Oral)   Ht 5\' 10"  (1.778 m)   Wt 186 lb (84.4 kg)   SpO2 96%   BMI 26.69 kg/m  VS noted,  Constitutional: Pt appears in NAD HENT: Head: NCAT.  Right Ear: External ear normal.  Left Ear: External ear normal.  Eyes: . Pupils are equal, round, and reactive to light. Conjunctivae and EOM are normal Nose: without d/c or deformity Neck: Neck supple. Gross normal ROM Cardiovascular: Normal rate and regular rhythm.   Pulmonary/Chest: Effort normal and breath sounds without rales or wheezing.  Abd:  Soft, NT, ND, + BS, no organomegaly Neurological: Pt is alert. At baseline orientation, motor grossly intact Skin: Skin is warm. No rashes, other new lesions, no LE edema Psychiatric: Pt behavior is normal without agitation  All otherwise neg per pt Lab Results  Component Value Date   WBC 7.8 03/30/2019   HGB 11.8 (L) 03/30/2019   HCT 35.0 (L) 03/30/2019   PLT 218.0 03/30/2019   GLUCOSE 90 03/30/2019   NA 139 03/30/2019   K 3.9 03/30/2019   CL 103 03/30/2019   CREATININE 1.25 03/30/2019   BUN 21 03/30/2019   CO2 27 03/30/2019   TSH 0.63 03/16/2019       Assessment & Plan:

## 2019-12-19 LAB — URINALYSIS, ROUTINE W REFLEX MICROSCOPIC
Bilirubin Urine: NEGATIVE
Hgb urine dipstick: NEGATIVE
Leukocytes,Ua: NEGATIVE
Nitrite: NEGATIVE
RBC / HPF: NONE SEEN (ref 0–?)
Specific Gravity, Urine: >=1.03 — AB (ref 1.000–1.030)
Urine Glucose: NEGATIVE
Urobilinogen, UA: 1 (ref 0.0–1.0)
pH: 5.5 (ref 5.0–8.0)

## 2019-12-19 LAB — HIV ANTIBODY (ROUTINE TESTING W REFLEX): HIV 1&2 Ab, 4th Generation: NONREACTIVE

## 2020-01-30 ENCOUNTER — Other Ambulatory Visit: Payer: Self-pay

## 2020-01-30 MED ORDER — TELMISARTAN-HCTZ 80-25 MG PO TABS: 1.0000 | ORAL_TABLET | Freq: Every day | ORAL | 1 refills | Status: DC

## 2020-01-31 ENCOUNTER — Other Ambulatory Visit: Payer: Self-pay | Admitting: Internal Medicine

## 2020-01-31 NOTE — Telephone Encounter (Signed)
    Patient requesting telmisartan-hydrochlorothiazide (MICARDIS HCT) 80-25 MG tablet be sent to [facility] mail order, 90 day supply

## 2020-02-02 MED ORDER — TELMISARTAN-HCTZ 80-25 MG PO TABS: 1.0000 | ORAL_TABLET | Freq: Every day | ORAL | 1 refills | Status: DC

## 2020-02-19 ENCOUNTER — Telehealth: Payer: Self-pay

## 2020-02-19 NOTE — Telephone Encounter (Signed)
Unable to approve, since a rx for total 6 mo was already sent in may 2021

## 2020-02-19 NOTE — Telephone Encounter (Signed)
1.Medication Requested:temazepam (RESTORIL) 15 MG capsule  2. Pharmacy (Name, [place], [place]):[facility], [place] - [address]  3. On Med List: Yes   4. Last Visit with PCP: 5.10.21   5. Next visit date with PCP: 11.10.21    Agent: Please be advised that RX refills may take up to 3 business days. We ask that you follow-up with your pharmacy.

## 2020-02-20 MED ORDER — TEMAZEPAM 15 MG PO CAPS
15.0000 mg | ORAL_CAPSULE | Freq: Every evening | ORAL | 5 refills | Status: DC | PRN
Start: 2020-02-20 — End: 2020-07-03

## 2020-02-20 NOTE — Telephone Encounter (Signed)
Follow up message  Patient states [facility] does not have prescription.

## 2020-02-20 NOTE — Telephone Encounter (Signed)
Done erx 

## 2020-02-22 ENCOUNTER — Telehealth (INDEPENDENT_AMBULATORY_CARE_PROVIDER_SITE_OTHER): Payer: Medicare Other | Admitting: Internal Medicine

## 2020-02-22 DIAGNOSIS — M5442 Lumbago with sciatica, left side: Secondary | ICD-10-CM

## 2020-02-22 DIAGNOSIS — G8929 Other chronic pain: Secondary | ICD-10-CM | POA: Insufficient documentation

## 2020-02-22 DIAGNOSIS — M5441 Lumbago with sciatica, right side: Secondary | ICD-10-CM

## 2020-02-22 DIAGNOSIS — J019 Acute sinusitis, unspecified: Secondary | ICD-10-CM

## 2020-02-22 DIAGNOSIS — M545 Low back pain, unspecified: Secondary | ICD-10-CM

## 2020-02-22 DIAGNOSIS — I10 Essential (primary) hypertension: Secondary | ICD-10-CM | POA: Diagnosis not present

## 2020-02-22 HISTORY — DX: Low back pain, unspecified: M54.50

## 2020-02-22 HISTORY — DX: Acute sinusitis, unspecified: J01.90

## 2020-02-22 HISTORY — DX: Other chronic pain: G89.29

## 2020-02-22 MED ORDER — LEVOFLOXACIN 500 MG PO TABS
500.0000 mg | ORAL_TABLET | Freq: Every day | ORAL | 0 refills | Status: AC
Start: 2020-02-22 — End: 2020-03-03

## 2020-02-22 NOTE — Progress Notes (Signed)
Patient ID: Luis Wilkinson, male   DOB: 1955/09/25, 64 y.o.   MRN: [id number]  Virtual Visit via Video Note  I connected with Luis Wilkinson on 02/22/20 at  9:40 AM EDT by a video enabled telemedicine application and verified that I am speaking with the correct person using two identifiers.  Location: of all participants today Patient: at home Provider: at office   I discussed the limitations of evaluation and management by telemedicine and the availability of in person appointments. The patient expressed understanding and agreed to proceed.  History of Present Illness:  Here with 2-3 days acute onset fever, left throat and facial pain, pressure, headache, general weakness and malaise, and greenish d/c, with mild ST and cough, but pt denies chest pain, wheezing, increased sob or doe, orthopnea, PND, increased LE swelling, palpitations, dizziness or syncope.  Also with 2 mo worsening moderate lbP intermittent, sharp and dull, worse to stand but also with recurring LLE weakness after a few minute and has to sit .  Pt denies new neurological symptoms such as new headache, or facial or extremity weakness or numbness except for the above.   Pt denies polydipsia, polyuria, Past Medical History:  Diagnosis Date  . Aneurysm, ascending aorta (HCC)    followed br Dr Hortencia Pilar  . History of kidney stones   . History of partial colectomy 12/18/2019  . Hypertension   . RA (rheumatoid arthritis) ([place])   . 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 Seronegative rheumatoid arthritis ([place]) 12/18/2019   Past Surgical History:  Procedure Laterality Date  . CARDIAC CATHETERIZATION  Feb 2015    [place]   per pt normal coronary arteries  . CATARACT EXTRACTION W/ INTRAOCULAR LENS IMPLANT Right Mar 2014  . CYSTOSCOPY W/ URETEROSCOPY W/ LITHOTRIPSY    . EXCISION SUBDERMAL NECK TUMOR  2010   benign  . KNEE ARTHROSCOPY W/ MENISCECTOMY Right 01-09-2015  . LEFT ELBOW  RECONSTRUCTION  2010  . PARTIAL KNEE ARTHROPLASTY Right 08/08/2015   Procedure: RIGHT UNICOMPARTMENTAL KNEE REVISION PLASTIC;  Surgeon: Paralee Cancel, MD;  Location: [facility];  Service: Orthopedics;  Laterality: Right;  . PARTIAL KNEE ARTHROPLASTY Right 03/02/2016   Procedure: UNICOMPARTMENTAL RIGHT KNEE REVISION;  Surgeon: Paralee Cancel, MD;  Location: [facility];  Service: Orthopedics;  Laterality: Right;  . REPLACEMENT UNICONDYLAR JOINT KNEE Right 06-20-2015  . STRABISMUS SURGERY Left x6   last one --Age 73    reports that he quit smoking about 25 years ago. His smoking use included cigarettes. He has a 5.00 pack-year smoking history. He has never used smokeless tobacco. He reports that he does not drink alcohol and does not use drugs. family history includes Congestive Heart Failure in his father and mother. Allergies  Allergen Reactions  . Dilaudid [Hydromorphone] Shortness Of Breath and Other (See Comments)    Chest tight  . Plaquenil [Hydroxychloroquine Sulfate]    Current Outpatient Medications on File Prior to Visit  Medication Sig Dispense Refill  . atenolol (TENORMIN) 100 MG tablet One in am and a half in pm    . atorvastatin (LIPITOR) 10 MG tablet Take 10 mg by mouth daily.    . ergocalciferol (VITAMIN D2) 50000 units capsule Take 50,000 Units by mouth every Friday.     . gabapentin (NEURONTIN) 600 MG tablet Take 600 mg by mouth 2 (two) times daily.    . Glucosamine-Chondroit-Vit C-Mn (GLUCOSAMINE 1500 COMPLEX PO) Take 2 capsules by mouth daily.    Marland Kitchen  leflunomide (ARAVA) 20 MG tablet Take 20 mg by mouth daily.    Marland Kitchen loratadine-pseudoephedrine (CLARITIN-D 24-HOUR) 10-240 MG 24 hr tablet Take 1 tablet by mouth daily as needed for allergies.    . Multiple Vitamin (MULTIVITAMIN) tablet Take 1 tablet by mouth every morning.    . mupirocin nasal ointment (BACTROBAN) 2 % Place 1 application into the nose 2 (two) times daily. Use one-half of tube in each nostril twice daily for  five (5) days. After application, press sides of nose together and gently massage. 10 g 2  . pantoprazole (PROTONIX) 40 MG tablet Take 30- 60 min before your first and last meals of the day 60 tablet 11  . predniSONE (DELTASONE) 5 MG tablet 5 mg 2 (two) times a day as needed.    Marland Kitchen telmisartan-hydrochlorothiazide (MICARDIS HCT) 80-25 MG tablet Take 1 tablet by mouth daily. 90 tablet 1  . temazepam (RESTORIL) 15 MG capsule Take 1 capsule (15 mg total) by mouth at bedtime as needed for sleep. 30 capsule 5  . Turmeric 500 MG CAPS Take by mouth.     No current facility-administered medications on file prior to visit.   Observations/Objective: Alert, NAD, appropriate mood and affect, resps normal, cn 2-12 intact, moves all 4s, no visible rash or swelling Lab Results  Component Value Date   WBC 6.9 12/18/2019   HGB 11.6 (L) 12/18/2019   HCT 34.2 (L) 12/18/2019   PLT 277.0 12/18/2019   GLUCOSE 117 (H) 12/18/2019   CHOL 136 12/18/2019   TRIG 201.0 (H) 12/18/2019   HDL 34.30 (L) 12/18/2019   LDLDIRECT 71.0 12/18/2019   ALT 13 12/18/2019   AST 19 12/18/2019   NA 141 12/18/2019   K 3.9 12/18/2019   CL 103 12/18/2019   CREATININE 1.17 12/18/2019   BUN 19 12/18/2019   CO2 33 (H) 12/18/2019   TSH 0.67 12/18/2019   PSA 0.57 12/18/2019   HGBA1C 5.5 12/18/2019   Assessment and Plan: See notes  Follow Up Instructions: See notes   I discussed the assessment and treatment plan with the patient. The patient was provided an opportunity to ask questions and all were answered. The patient agreed with the plan and demonstrated an understanding of the instructions.   The patient was advised to call back or seek an in-person evaluation if the symptoms worsen or if the condition fails to improve as anticipated.  Cathlean Cower, MD

## 2020-02-22 NOTE — Assessment & Plan Note (Signed)
Stable to continue check BP at home, goal < 140/90

## 2020-02-24 ENCOUNTER — Encounter: Payer: Self-pay | Admitting: Internal Medicine

## 2020-02-24 NOTE — Patient Instructions (Signed)
Please take all new medication as prescribed  Please continue all other medications as before, and refills have been done if requested.  Please have the pharmacy call with any other refills you may need.  Please keep your appointments with your specialists as you may have planned  You will be contacted regarding the referral for: MRI and refer NS

## 2020-02-24 NOTE — Assessment & Plan Note (Signed)
Etiology ucnlear but significant symptoms, for MRI, and refer NS in [place] per pt

## 2020-02-24 NOTE — Assessment & Plan Note (Addendum)
Mild to mod, for antibx course,  to f/u any worsening symptoms or concerns  I spent 31 minutes in preparing to see the patient by review of recent labs, imaging and procedures, obtaining and reviewing separately obtained history, communicating with the patient and family or caregiver, ordering medications, tests or procedures, and documenting clinical information in the EHR including the differential Dx, treatment, and any further evaluation and other management of sinusitis, lbp, htn

## 2020-02-27 ENCOUNTER — Other Ambulatory Visit: Payer: Self-pay | Admitting: Internal Medicine

## 2020-02-27 ENCOUNTER — Other Ambulatory Visit: Payer: Self-pay

## 2020-02-27 ENCOUNTER — Telehealth: Payer: Self-pay | Admitting: Internal Medicine

## 2020-02-27 ENCOUNTER — Ambulatory Visit
Admission: RE | Admit: 2020-02-27 | Discharge: 2020-02-27 | Disposition: A | Discharge status: Home / Self Care | Payer: Medicare Other | Source: Ambulatory Visit | Attending: Internal Medicine | Admitting: Internal Medicine

## 2020-02-27 DIAGNOSIS — M795 Residual foreign body in soft tissue: Secondary | ICD-10-CM

## 2020-02-27 DIAGNOSIS — M5442 Lumbago with sciatica, left side: Secondary | ICD-10-CM

## 2020-02-27 NOTE — Telephone Encounter (Signed)
Patient requesting Valium prior to having MRI MRI attempted today, however patient became claustrophobic unable to complete test. Imaging requesting patient be rescheduled in room 1

## 2020-02-28 MED ORDER — DIAZEPAM 5 MG PO TABS: ORAL_TABLET | ORAL | 0 refills | Status: DC

## 2020-02-28 NOTE — Addendum Note (Signed)
Addended by: Biagio Borg on: 02/28/2020 01:02 PM   Modules accepted: Orders

## 2020-02-28 NOTE — Telephone Encounter (Signed)
Done erx 

## 2020-03-09 ENCOUNTER — Ambulatory Visit
Admission: RE | Admit: 2020-03-09 | Discharge: 2020-03-09 | Disposition: A | Discharge status: Home / Self Care | Payer: Medicare Other | Source: Ambulatory Visit | Attending: Internal Medicine | Admitting: Internal Medicine

## 2020-03-09 ENCOUNTER — Other Ambulatory Visit: Payer: Self-pay

## 2020-03-09 DIAGNOSIS — M5442 Lumbago with sciatica, left side: Secondary | ICD-10-CM

## 2020-03-10 ENCOUNTER — Encounter: Payer: Self-pay | Admitting: Internal Medicine

## 2020-03-11 DIAGNOSIS — Z6827 Body mass index (BMI) 27.0-27.9, adult: Secondary | ICD-10-CM | POA: Insufficient documentation

## 2020-03-11 HISTORY — DX: Body mass index (BMI) 27.0-27.9, adult: Z68.27

## 2020-03-12 DIAGNOSIS — G8929 Other chronic pain: Secondary | ICD-10-CM

## 2020-03-12 DIAGNOSIS — R29818 Other symptoms and signs involving the nervous system: Secondary | ICD-10-CM

## 2020-03-12 HISTORY — DX: Other symptoms and signs involving the nervous system: R29.818

## 2020-03-12 HISTORY — DX: Other chronic pain: G89.29

## 2020-03-13 ENCOUNTER — Other Ambulatory Visit: Payer: Self-pay | Admitting: Physician Assistant

## 2020-03-13 DIAGNOSIS — R29818 Other symptoms and signs involving the nervous system: Secondary | ICD-10-CM

## 2020-03-17 ENCOUNTER — Other Ambulatory Visit: Payer: Medicare Other

## 2020-04-07 ENCOUNTER — Ambulatory Visit
Admission: RE | Admit: 2020-04-07 | Discharge: 2020-04-07 | Disposition: A | Discharge status: Home / Self Care | Payer: Medicare Other | Source: Ambulatory Visit | Attending: Physician Assistant | Admitting: Physician Assistant

## 2020-04-07 DIAGNOSIS — R29818 Other symptoms and signs involving the nervous system: Secondary | ICD-10-CM

## 2020-05-17 ENCOUNTER — Other Ambulatory Visit: Payer: Self-pay | Admitting: Internal Medicine

## 2020-05-17 MED ORDER — PANTOPRAZOLE SODIUM 40 MG PO TBEC: DELAYED_RELEASE_TABLET | ORAL | 3 refills | Status: DC

## 2020-06-19 ENCOUNTER — Ambulatory Visit: Payer: Medicare Other | Admitting: Internal Medicine

## 2020-06-25 ENCOUNTER — Ambulatory Visit: Payer: Medicare Other | Admitting: Internal Medicine

## 2020-07-02 ENCOUNTER — Other Ambulatory Visit: Payer: Self-pay

## 2020-07-03 ENCOUNTER — Encounter: Payer: Self-pay | Admitting: Internal Medicine

## 2020-07-03 ENCOUNTER — Ambulatory Visit (INDEPENDENT_AMBULATORY_CARE_PROVIDER_SITE_OTHER): Payer: Medicare Other | Admitting: Internal Medicine

## 2020-07-03 VITALS — BP 126/65 | HR 96 | Temp 98.2°F | Ht 70.0 in | Wt 184.0 lb

## 2020-07-03 DIAGNOSIS — I712 Thoracic aortic aneurysm, without rupture: Secondary | ICD-10-CM

## 2020-07-03 DIAGNOSIS — D649 Anemia, unspecified: Secondary | ICD-10-CM

## 2020-07-03 DIAGNOSIS — R739 Hyperglycemia, unspecified: Secondary | ICD-10-CM | POA: Diagnosis not present

## 2020-07-03 DIAGNOSIS — I10 Essential (primary) hypertension: Secondary | ICD-10-CM

## 2020-07-03 DIAGNOSIS — I7121 Aneurysm of the ascending aorta, without rupture: Secondary | ICD-10-CM

## 2020-07-03 HISTORY — DX: Anemia, unspecified: D64.9

## 2020-07-03 LAB — CBC WITH DIFFERENTIAL/PLATELET
Basophils Absolute: 0.1 10*3/uL (ref 0.0–0.1)
Basophils Relative: 0.9 % (ref 0.0–3.0)
Eosinophils Absolute: 0 10*3/uL (ref 0.0–0.7)
Eosinophils Relative: 0.3 % (ref 0.0–5.0)
HCT: 37.7 % — ABNORMAL LOW (ref 39.0–52.0)
Hemoglobin: 12.7 g/dL — ABNORMAL LOW (ref 13.0–17.0)
Lymphocytes Relative: 8.5 % — ABNORMAL LOW (ref 12.0–46.0)
Lymphs Abs: 0.5 10*3/uL — ABNORMAL LOW (ref 0.7–4.0)
MCHC: 33.8 g/dL (ref 30.0–36.0)
MCV: 95.2 fl (ref 78.0–100.0)
Monocytes Absolute: 0.4 10*3/uL (ref 0.1–1.0)
Monocytes Relative: 6.4 % (ref 3.0–12.0)
Neutro Abs: 4.8 10*3/uL (ref 1.4–7.7)
Neutrophils Relative %: 83.9 % — ABNORMAL HIGH (ref 43.0–77.0)
Platelets: 255 10*3/uL (ref 150.0–400.0)
RBC: 3.96 Mil/uL — ABNORMAL LOW (ref 4.22–5.81)
RDW: 13.6 % (ref 11.5–15.5)
WBC: 5.8 10*3/uL (ref 4.0–10.5)

## 2020-07-03 LAB — IBC PANEL
Iron: 179 ug/dL — ABNORMAL HIGH (ref 42–165)
Saturation Ratios: 42.6 % (ref 20.0–50.0)
Transferrin: 300 mg/dL (ref 212.0–360.0)

## 2020-07-03 LAB — FERRITIN: Ferritin: 35.6 ng/mL (ref 22.0–322.0)

## 2020-07-03 LAB — HEMOGLOBIN A1C: Hgb A1c MFr Bld: 5.6 % (ref 4.6–6.5)

## 2020-07-03 MED ORDER — APIXABAN 5 MG PO TABS: ORAL_TABLET | ORAL | 3 refills | Status: DC

## 2020-07-03 MED ORDER — ATENOLOL 100 MG PO TABS: ORAL_TABLET | ORAL | 3 refills | Status: DC

## 2020-07-03 MED ORDER — TEMAZEPAM 15 MG PO CAPS: 15.0000 mg | ORAL_CAPSULE | Freq: Every evening | ORAL | 1 refills | Status: DC | PRN

## 2020-07-03 MED ORDER — ATORVASTATIN CALCIUM 10 MG PO TABS: 10.0000 mg | ORAL_TABLET | Freq: Every day | ORAL | 3 refills | Status: DC

## 2020-07-03 MED ORDER — ATENOLOL 100 MG PO TABS: ORAL_TABLET | ORAL | 0 refills | Status: DC

## 2020-07-03 MED ORDER — PANTOPRAZOLE SODIUM 40 MG PO TBEC: DELAYED_RELEASE_TABLET | ORAL | 3 refills | Status: DC

## 2020-07-03 MED ORDER — DULOXETINE HCL 60 MG PO CPEP: 60.0000 mg | ORAL_CAPSULE | Freq: Every day | ORAL | 3 refills | Status: DC

## 2020-07-03 MED ORDER — TELMISARTAN-HCTZ 80-25 MG PO TABS: 1.0000 | ORAL_TABLET | Freq: Every day | ORAL | 3 refills | Status: DC

## 2020-07-03 NOTE — Patient Instructions (Addendum)
Please OTC Vitamin D3 at 2000 units per day, indefinitely.  Please continue all other medications as before, and refills have been done if requested.  Please have the pharmacy call with any other refills you may need.  Please continue your efforts at being more active, low cholesterol diet, and weight control.  You are otherwise up to date with prevention measures today.  Please keep your appointments with your specialists as you may have planned  You will be contacted regarding the referral for: CT aorta, and cardiology  Please go to the LAB at the blood drawing area for the tests to be done  You will be contacted by phone if any changes need to be made immediately.  Otherwise, you will receive a letter about your results with an explanation, but please check with MyChart first.  Please remember to sign up for MyChart if you have not done so, as this will be important to you in the future with finding out test results, communicating by private email, and scheduling acute appointments online when needed.  Please make an Appointment to return in 6 months, or sooner if needed

## 2020-07-03 NOTE — Progress Notes (Signed)
Subjective:    Patient ID: Luis Wilkinson, male    DOB: [birth year], 64 y.o.   MRN: [id number]  HPI  Here to f/u; overall doing ok,  Pt denies chest pain, increasing sob or doe, wheezing, orthopnea, PND, increased LE swelling, palpitations, dizziness or syncope.  Pt denies new neurological symptoms such as new headache, or facial or extremity weakness or numbness.  Pt denies polydipsia, polyuria, or low sugar episode.  Pt states overall good compliance with meds, mostly trying to follow appropriate diet, with wt overall stable,  but little exercise however.  Has known hx of aortic dilation, due for f/u, last imaging July 2020.   Need atenolol and other refills.  Has f/u with [facility] for dec 2 will likely need right [facility] surg[facility], pt asks for ecg today as he anticipates needs clearance medical.  No overt bldding recently.  Past Medical History:  Diagnosis Date  . Aneurysm, ascending aorta (HCC)    followed br Dr Hortencia Pilar  . History of kidney stones   . History of partial colectomy 12/18/2019  . Hypertension   . RA (rheumatoid arthritis) ([place])   . 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 Seronegative rheumatoid arthritis ([place]) 12/18/2019   Past Surgical History:  Procedure Laterality Date  . CARDIAC CATHETERIZATION  Feb 2015    [place]   per pt normal coronary arteries  . CATARACT EXTRACTION W/ INTRAOCULAR LENS IMPLANT Right Mar 2014  . CYSTOSCOPY W/ URETEROSCOPY W/ LITHOTRIPSY    . EXCISION SUBDERMAL NECK TUMOR  2010   benign  . KNEE ARTHROSCOPY W/ MENISCECTOMY Right 01-09-2015  . LEFT ELBOW RECONSTRUCTION  2010  . PARTIAL KNEE ARTHROPLASTY Right 08/08/2015   Procedure: RIGHT UNICOMPARTMENTAL KNEE REVISION PLASTIC;  Surgeon: Paralee Cancel, MD;  Location: [facility];  Service: Orthopedics;  Laterality: Right;  . PARTIAL KNEE ARTHROPLASTY Right 03/02/2016   Procedure: UNICOMPARTMENTAL RIGHT KNEE REVISION;  Surgeon: Paralee Cancel, MD;  Location: [facility];  Service: Orthopedics;  Laterality: Right;  . REPLACEMENT UNICONDYLAR JOINT KNEE Right 06-20-2015  . STRABISMUS SURGERY Left x6   last one --Age 28    reports that he quit smoking about 25 years ago. His smoking use included cigarettes. He has a 5.00 pack-year smoking history. He has never used smokeless tobacco. He reports that he does not drink alcohol and does not use drugs. family history includes Congestive Heart Failure in his father and mother. Allergies  Allergen Reactions  . Dilaudid [Hydromorphone] Shortness Of Breath and Other (See Comments)    Chest tight  . Plaquenil [Hydroxychloroquine Sulfate]    Current Outpatient Medications on File Prior to Visit  Medication Sig Dispense Refill  . Glucosamine-Chondroit-Vit C-Mn (GLUCOSAMINE 1500 COMPLEX PO) Take 2 capsules by mouth daily.    Marland Kitchen leflunomide (ARAVA) 20 MG tablet Take 20 mg by mouth daily.    Marland Kitchen loratadine-pseudoephedrine (CLARITIN-D 24-HOUR) 10-240 MG 24 hr tablet Take 1 tablet by mouth daily as needed for allergies.    . Multiple Vitamin (MULTIVITAMIN) tablet Take 1 tablet by mouth every morning.    . mupirocin nasal ointment (BACTROBAN) 2 % Place 1 application into the nose 2 (two) times daily. Use one-half of tube in each nostril twice daily for five (5) days. After application, press sides of nose together and gently massage. 10 g 2  . Turmeric 500 MG CAPS Take by mouth.     No current facility-administered medications on file  prior to visit.   Review of Systems All otherwise neg per pt    Objective:   Physical Exam BP 126/65   Pulse 96   Temp 98.2 F (36.8 C) (Oral)   Ht 5\' 10"  (1.778 m)   Wt 184 lb (83.5 kg)   SpO2 96%   BMI 26.40 kg/m  VS noted,  Constitutional: Pt appears in NAD HENT: Head: NCAT.  Right Ear: External ear normal.  Left Ear: External ear normal.  Eyes: . Pupils are equal, round, and reactive to light. Conjunctivae and EOM are normal Nose: without d/c or  deformity Neck: Neck supple. Gross normal ROM Cardiovascular: Normal rate and regular rhythm.   Pulmonary/Chest: Effort normal and breath sounds without rales or wheezing.  Abd:  Soft, NT, ND, + BS, no organomegaly Neurological: Pt is alert. At baseline orientation, motor grossly intact Skin: Skin is warm. No rashes, other new lesions, no LE edema Psychiatric: Pt behavior is normal without agitation  All otherwise neg per pt Lab Results  Component Value Date   WBC 5.8 07/03/2020   HGB 12.7 (L) 07/03/2020   HCT 37.7 (L) 07/03/2020   PLT 255.0 07/03/2020   GLUCOSE 117 (H) 12/18/2019   CHOL 136 12/18/2019   TRIG 201.0 (H) 12/18/2019   HDL 34.30 (L) 12/18/2019   LDLDIRECT 71.0 12/18/2019   ALT 13 12/18/2019   AST 19 12/18/2019   NA 141 12/18/2019   K 3.9 12/18/2019   CL 103 12/18/2019   CREATININE 1.17 12/18/2019   BUN 19 12/18/2019   CO2 33 (H) 12/18/2019   TSH 0.67 12/18/2019   PSA 0.57 12/18/2019   HGBA1C 5.6 07/03/2020      Assessment & Plan:

## 2020-07-04 DIAGNOSIS — R739 Hyperglycemia, unspecified: Secondary | ICD-10-CM | POA: Insufficient documentation

## 2020-07-04 HISTORY — DX: Hyperglycemia, unspecified: R73.9

## 2020-07-04 NOTE — Assessment & Plan Note (Signed)
Very mild, also for a1c with labs

## 2020-07-04 NOTE — Assessment & Plan Note (Signed)
To restart BB today, cont to monitor BP at home and next visit

## 2020-07-04 NOTE — Assessment & Plan Note (Addendum)
With ? Trend lower, for f/u lab and ion today  I spent 31 minutes in preparing to see the patient by review of recent labs, imaging and procedures, obtaining and reviewing separately obtained history, communicating with the patient and family or caregiver, ordering medications, tests or procedures, and documenting clinical information in the EHR including the differential Dx, treatment, and any further evaluation and other management of aortic aneurysm, htn, hyperglycemia, anemia

## 2020-07-04 NOTE — Assessment & Plan Note (Signed)
Due for cta aorta and f/u cardiology,o/w stable overall by history and exam, recent data reviewed with pt, and pt to continue medical treatment as before,  to f/u any worsening symptoms or concerns

## 2020-07-09 ENCOUNTER — Telehealth: Payer: Self-pay | Admitting: Internal Medicine

## 2020-07-09 ENCOUNTER — Other Ambulatory Visit: Payer: Self-pay

## 2020-07-09 DIAGNOSIS — I712 Thoracic aortic aneurysm, without rupture: Secondary | ICD-10-CM | POA: Insufficient documentation

## 2020-07-09 DIAGNOSIS — Z96651 Presence of right artificial knee joint: Secondary | ICD-10-CM | POA: Insufficient documentation

## 2020-07-09 DIAGNOSIS — I10 Essential (primary) hypertension: Secondary | ICD-10-CM | POA: Insufficient documentation

## 2020-07-09 DIAGNOSIS — Z87442 Personal history of urinary calculi: Secondary | ICD-10-CM | POA: Insufficient documentation

## 2020-07-09 DIAGNOSIS — M069 Rheumatoid arthritis, unspecified: Secondary | ICD-10-CM | POA: Insufficient documentation

## 2020-07-09 DIAGNOSIS — I7121 Aneurysm of the ascending aorta, without rupture: Secondary | ICD-10-CM | POA: Insufficient documentation

## 2020-07-09 MED ORDER — DIAZEPAM 5 MG PO TABS: ORAL_TABLET | ORAL | 0 refills | Status: DC

## 2020-07-09 NOTE — Telephone Encounter (Signed)
Ok for valium 5 mg x 1 prior to procedure - done erx

## 2020-07-09 NOTE — Telephone Encounter (Signed)
    Patient requesting medication for anxiety be called to [facility]. He states he is anxious about having CT scan on 12/3

## 2020-07-09 NOTE — Telephone Encounter (Signed)
Sent to Dr. John. 

## 2020-07-10 ENCOUNTER — Ambulatory Visit (INDEPENDENT_AMBULATORY_CARE_PROVIDER_SITE_OTHER): Payer: Medicare Other | Admitting: Cardiology

## 2020-07-10 ENCOUNTER — Ambulatory Visit: Payer: Medicare Other | Admitting: Cardiology

## 2020-07-10 ENCOUNTER — Encounter: Payer: Self-pay | Admitting: Cardiology

## 2020-07-10 ENCOUNTER — Other Ambulatory Visit: Payer: Self-pay

## 2020-07-10 VITALS — BP 120/80 | HR 87 | Ht 70.0 in | Wt 184.8 lb

## 2020-07-10 DIAGNOSIS — I10 Essential (primary) hypertension: Secondary | ICD-10-CM

## 2020-07-10 DIAGNOSIS — I712 Thoracic aortic aneurysm, without rupture, unspecified: Secondary | ICD-10-CM

## 2020-07-10 DIAGNOSIS — R0602 Shortness of breath: Secondary | ICD-10-CM | POA: Diagnosis not present

## 2020-07-10 NOTE — Patient Instructions (Signed)
Medication Instructions:  Your physician recommends that you continue on your current medications as directed. Please refer to the Current Medication list given to you today.  *If you need a refill on your cardiac medications before your next appointment, please call your pharmacy*   Lab Work: None If you have labs (blood work) drawn today and your tests are completely normal, you will receive your results only by: Marland Kitchen MyChart Message (if you have MyChart) OR . A paper copy in the mail If you have any lab test that is abnormal or we need to change your treatment, we will call you to review the results.   Testing/Procedures: Your physician has requested that you have an echocardiogram. Echocardiography is a painless test that uses sound waves to create images of your heart. It provides your doctor with information about the size and shape of your heart and how well your heart's chambers and valves are working. This procedure takes approximately one hour. There are no restrictions for this procedure.     Follow-Up: At [facility], you and your health needs are our priority.  As part of our continuing mission to provide you with exceptional heart care, we have created designated Provider Care Teams.  These Care Teams include your primary Cardiologist (physician) and Advanced Practice Providers (APPs -  Physician Assistants and Nurse Practitioners) who all work together to provide you with the care you need, when you need it.  We recommend signing up for the patient portal called "MyChart".  Sign up information is provided on this After Visit Summary.  MyChart is used to connect with patients for Virtual Visits (Telemedicine).  Patients are able to view lab/test results, encounter notes, upcoming appointments, etc.  Non-urgent messages can be sent to your provider as well.   To learn more about what you can do with MyChart, go to [email or web address].    Your next appointment:   3  month(s)  The format for your next appointment:   In Person  Provider:   Berniece Salines, DO   Other Instructions

## 2020-07-10 NOTE — Progress Notes (Signed)
Cardiology Office Note:    Date:  07/10/2020   ID:  Luis Wilkinson, DOB 01-16-1956, MRN [id number]  PCP:  Biagio Borg, MD  Cardiologist:  Berniece Salines, DO  Electrophysiologist:  None   Referring MD: Biagio Borg, MD   " I am planning hand surgery"  History of Present Illness:    Luis Wilkinson is a 64 y.o. male with a hx of rheumatoid arthritis, current smoker, heart catheterization in 2015 with normal coronary arteries, ascending thoracic aortic aneurysm last reported to be 4.5 cm in July 2020, hypertension.  The patient was referred for cardiac clearance given her history of aortic aneurysm prior to his hand surgery.  Patient denies any chest pain but reports that he has had some shortness of breath on exertion.  He notes that this is mostly when he climbs a flight of stairs and gets short of breath.  No other complaints at this time other than the fact that he is looking forward to getting his hand surgery done.   Past Medical History:  Diagnosis Date  . Aneurysm, ascending aorta (HCC)    followed br Dr Hortencia Pilar  . History of kidney stones   . History of partial colectomy 12/18/2019  . Hypertension   . RA (rheumatoid arthritis) ([place])   . 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 Seronegative rheumatoid arthritis ([place]) 12/18/2019    Past Surgical History:  Procedure Laterality Date  . CARDIAC CATHETERIZATION  Feb 2015    [place]   per pt normal coronary arteries  . CATARACT EXTRACTION W/ INTRAOCULAR LENS IMPLANT Right Mar 2014  . CYSTOSCOPY W/ URETEROSCOPY W/ LITHOTRIPSY    . EXCISION SUBDERMAL NECK TUMOR  2010   benign  . KNEE ARTHROSCOPY W/ MENISCECTOMY Right 01-09-2015  . LEFT ELBOW RECONSTRUCTION  2010  . PARTIAL KNEE ARTHROPLASTY Right 08/08/2015   Procedure: RIGHT UNICOMPARTMENTAL KNEE REVISION PLASTIC;  Surgeon: Paralee Cancel, MD;  Location: [facility];  Service: Orthopedics;  Laterality: Right;  .  PARTIAL KNEE ARTHROPLASTY Right 03/02/2016   Procedure: UNICOMPARTMENTAL RIGHT KNEE REVISION;  Surgeon: Paralee Cancel, MD;  Location: [facility];  Service: Orthopedics;  Laterality: Right;  . REPLACEMENT UNICONDYLAR JOINT KNEE Right 06-20-2015  . STRABISMUS SURGERY Left x6   last one --Age 30    Current Medications: Current Meds  Medication Sig  . atenolol (TENORMIN) 100 MG tablet One in am and a half in pm  . DULoxetine (CYMBALTA) 60 MG capsule Take 1 capsule (60 mg total) by mouth daily.  . Glucosamine-Chondroit-Vit C-Mn (GLUCOSAMINE 1500 COMPLEX PO) Take 2 capsules by mouth daily.  Marland Kitchen leflunomide (ARAVA) 20 MG tablet Take 20 mg by mouth daily.  . Multiple Vitamin (MULTIVITAMIN) tablet Take 1 tablet by mouth every morning.  . pantoprazole (PROTONIX) 40 MG tablet Take 30- 60 min before your first and last meals of the day  . telmisartan-hydrochlorothiazide (MICARDIS HCT) 80-25 MG tablet Take 1 tablet by mouth daily.  . temazepam (RESTORIL) 15 MG capsule Take 1 capsule (15 mg total) by mouth at bedtime as needed for sleep.  . Turmeric 500 MG CAPS Take by mouth.     Allergies:   Dilaudid [hydromorphone], Hydroxychloroquine sulfate, Morphine, Hydroxychloroquine, and Infliximab   Social History   Socioeconomic History  . Marital status: Married    Spouse name: Not on file  . Number of children: Not on file  . Years of education: Not on file  .  Highest education level: Not on file  Occupational History  . Not on file  Tobacco Use  . Smoking status: Former Smoker    Packs/day: 0.50    Years: 10.00    Pack years: 5.00    Types: Cigarettes    Quit date: 07/19/1994    Years since quitting: 25.9  . Smokeless tobacco: Never Used  Substance and Sexual Activity  . Alcohol use: No  . Drug use: No  . Sexual activity: Not on file  Other Topics Concern  . Not on file  Social History Narrative  . Not on file   Social Determinants of Health   Financial Resource Strain:   . Difficulty of  Paying Living Expenses: Not on file  Food Insecurity:   . Worried About Charity fundraiser in the Last Year: Not on file  . Ran Out of Food in the Last Year: Not on file  Transportation Needs:   . Lack of Transportation (Medical): Not on file  . Lack of Transportation (Non-Medical): Not on file  Physical Activity:   . Days of Exercise per Week: Not on file  . Minutes of Exercise per Session: Not on file  Stress:   . Feeling of Stress : Not on file  Social Connections:   . Frequency of Communication with Friends and Family: Not on file  . Frequency of Social Gatherings with Friends and Family: Not on file  . Attends Religious Services: Not on file  . Active Member of Clubs or Organizations: Not on file  . Attends Archivist Meetings: Not on file  . Marital Status: Not on file     Family History: The patient's family history includes Congestive Heart Failure in his father and mother.  ROS:   Review of Systems  Constitution: Negative for decreased appetite, fever and weight gain.  HENT: Negative for congestion, ear discharge, hoarse voice and sore throat.   Eyes: Negative for discharge, redness, vision loss in right eye and visual halos.  Cardiovascular: Negative for chest pain, dyspnea on exertion, leg swelling, orthopnea and palpitations.  Respiratory: Negative for cough, hemoptysis, shortness of breath and snoring.   Endocrine: Negative for heat intolerance and polyphagia.  Hematologic/Lymphatic: Negative for bleeding problem. Does not bruise/bleed easily.  Skin: Negative for flushing, nail changes, rash and suspicious lesions.  Musculoskeletal: Negative for arthritis, joint pain, muscle cramps, myalgias, neck pain and stiffness.  Gastrointestinal: Negative for abdominal pain, bowel incontinence, diarrhea and excessive appetite.  Genitourinary: Negative for decreased libido, genital sores and incomplete emptying.  Neurological: Negative for brief paralysis, focal  weakness, headaches and loss of balance.  Psychiatric/Behavioral: Negative for altered mental status, depression and suicidal ideas.  Allergic/Immunologic: Negative for HIV exposure and persistent infections.    EKGs/Labs/Other Studies Reviewed:    The following studies were reviewed today:   EKG:  The ekg ordered today demonstrates sinus rhythm, heart rate 87 bpm.  TTE IMPRESSIONS    1. The left ventricle has normal systolic function with an ejection  fraction of 60-65%. The cavity size was normal. Left ventricular diastolic  Doppler parameters are consistent with pseudonormalization.  2. The right ventricle has normal systolic function. The cavity was  normal. There is mildly increased right ventricular wall thickness.  3. The mitral valve is grossly normal.  4. The aorta is abnormal in size and structure.  5. There is moderate dilatation of the ascending aorta measuring 45 mm.   FINDINGS  Left Ventricle: The left ventricle has normal  systolic function, with an  ejection fraction of 60-65%. The cavity size was normal. There is no  increase in left ventricular wall thickness. Left ventricular diastolic  Doppler parameters are consistent with  pseudonormalization.   Right Ventricle: The right ventricle has normal systolic function. The  cavity was normal. There is mildly increased right ventricular wall  thickness.   Left Atrium: Left atrial size was normal in size.   Right Atrium: Right atrial size was normal in size. Right atrial pressure is estimated at 3 mmHg.   Interatrial Septum: No atrial level shunt detected by color flow Doppler.   Pericardium: There is no evidence of pericardial effusion.   Mitral Valve: The mitral valve is grossly normal. Mitral valve regurgitation was not assessed by color flow Doppler.   Tricuspid Valve: The tricuspid valve is normal in structure. Tricuspid valve regurgitation is mild by color flow Doppler.   Aortic Valve: The aortic  valve is normal in structure. Aortic valve regurgitation is mild by color flow Doppler.   Pulmonic Valve: The pulmonic valve was not well visualized. Pulmonic valve regurgitation was not assessed by color flow Doppler.   Aorta: The aorta is abnormal in size and structure. There is moderate dilatation of the ascending aorta measuring 45 mm.   Venous: The inferior vena cava measures 1.70 cm, is normal in size with  greater than 50% respiratory variability.   Recent Labs: 12/18/2019: ALT 13; BUN 19; Creatinine, Ser 1.17; Potassium 3.9; Sodium 141; TSH 0.67 07/03/2020: Hemoglobin 12.7; Platelets 255.0  Recent Lipid Panel    Component Value Date/Time   CHOL 136 12/18/2019 1524   TRIG 201.0 (H) 12/18/2019 1524   HDL 34.30 (L) 12/18/2019 1524   CHOLHDL 4 12/18/2019 1524   VLDL 40.2 (H) 12/18/2019 1524   LDLDIRECT 71.0 12/18/2019 1524    Physical Exam:    VS:  BP 120/80 (BP Location: Right Arm)   Pulse 87   Ht 5\' 10"  (1.778 m)   Wt 184 lb 12.8 oz (83.8 kg)   SpO2 97%   BMI 26.52 kg/m     Wt Readings from Last 3 Encounters:  07/10/20 184 lb 12.8 oz (83.8 kg)  07/03/20 184 lb (83.5 kg)  12/18/19 186 lb (84.4 kg)     GEN: Well nourished, well developed in no acute distress HEENT: Normal NECK: No JVD; No carotid bruits LYMPHATICS: No lymphadenopathy CARDIAC: S1S2 noted,RRR, no murmurs, rubs, gallops RESPIRATORY:  Clear to auscultation without rales, wheezing or rhonchi  ABDOMEN: Soft, non-tender, non-distended, +bowel sounds, no guarding. EXTREMITIES: No edema, No cyanosis, no clubbing MUSCULOSKELETAL:  No deformity  SKIN: Warm and dry NEUROLOGIC:  Alert and oriented x 3, non-focal PSYCHIATRIC:  Normal affect, good insight  ASSESSMENT:    1. Essential hypertension   2. Thoracic aortic aneurysm without rupture ([place])   3. Shortness of breath    PLAN:     The patient is pending repeat CAT scan for his thoracic aortic aneurysm I like to hold off on any surgical clearance  at this time until we have this repeat study to get appropriate measurements and can make good recommendations.  In addition given he was 4.5 cm last year I will start the referral process to vascular surgery for evaluation of this patient. He has some shortness of breath I am getting get an echocardiogram to assess for any valvular abnormalities at this time as well as an EF changes.  I reviewed his echocardiogram which was done a year ago as well.  If this is normal and his shortness of breath progress he may need an ischemic evaluation.  I which time I would recommend a coronary CTA. His blood pressure acceptable Michaelene change any of his antihypertensive medication.  The patient is in agreement with the above plan. The patient left the office in stable condition.  The patient will follow up in 3 months or sooner if needed.   Medication Adjustments/Labs and Tests Ordered: Current medicines are reviewed at length with the patient today.  Concerns regarding medicines are outlined above.  Orders Placed This Encounter  Procedures  . Ambulatory referral to Vascular Surgery  . EKG 12-Lead  . ECHOCARDIOGRAM COMPLETE   No orders of the defined types were placed in this encounter.   Patient Instructions  Medication Instructions:  Your physician recommends that you continue on your current medications as directed. Please refer to the Current Medication list given to you today.  *If you need a refill on your cardiac medications before your next appointment, please call your pharmacy*   Lab Work: None If you have labs (blood work) drawn today and your tests are completely normal, you will receive your results only by: Marland Kitchen MyChart Message (if you have MyChart) OR . A paper copy in the mail If you have any lab test that is abnormal or we need to change your treatment, we will call you to review the results.   Testing/Procedures: Your physician has requested that you have an echocardiogram.  Echocardiography is a painless test that uses sound waves to create images of your heart. It provides your doctor with information about the size and shape of your heart and how well your heart's chambers and valves are working. This procedure takes approximately one hour. There are no restrictions for this procedure.     Follow-Up: At [facility], you and your health needs are our priority.  As part of our continuing mission to provide you with exceptional heart care, we have created designated Provider Care Teams.  These Care Teams include your primary Cardiologist (physician) and Advanced Practice Providers (APPs -  Physician Assistants and Nurse Practitioners) who all work together to provide you with the care you need, when you need it.  We recommend signing up for the patient portal called "MyChart".  Sign up information is provided on this After Visit Summary.  MyChart is used to connect with patients for Virtual Visits (Telemedicine).  Patients are able to view lab/test results, encounter notes, upcoming appointments, etc.  Non-urgent messages can be sent to your provider as well.   To learn more about what you can do with MyChart, go to [email or web address].    Your next appointment:   3 month(s)  The format for your next appointment:   In Person  Provider:   Berniece Salines, DO   Other Instructions      Adopting a Healthy Lifestyle.  Know what a healthy weight is for you (roughly BMI <25) and aim to maintain this   Aim for 7+ servings of fruits and vegetables daily   65-80+ fluid ounces of water or unsweet tea for healthy kidneys   Limit to max 1 drink of alcohol per day; avoid smoking/tobacco   Limit animal fats in diet for cholesterol and heart health - choose grass fed whenever available   Avoid highly processed foods, and foods high in saturated/trans fats   Aim for low stress - take time to unwind and care for your mental health   Aim for 150  min of moderate  intensity exercise weekly for heart health, and weights twice weekly for bone health   Aim for 7-9 hours of sleep daily   When it comes to diets, agreement about the perfect plan isnt easy to find, even among the experts. Experts at the [employer] developed an idea known as the Healthy Eating Plate. Just imagine a plate divided into logical, healthy portions.   The emphasis is on diet quality:   Load up on vegetables and fruits - one-half of your plate: Aim for color and variety, and remember that potatoes dont count.   Go for whole grains - one-quarter of your plate: Whole wheat, barley, wheat berries, quinoa, oats, brown rice, and foods made with them. If you want pasta, go with whole wheat pasta.   Protein power - one-quarter of your plate: Fish, chicken, beans, and nuts are all healthy, versatile protein sources. Limit red meat.   The diet, however, does go beyond the plate, offering a few other suggestions.   Use healthy plant oils, such as olive, canola, soy, corn, sunflower and peanut. Check the labels, and avoid partially hydrogenated oil, which have unhealthy trans fats.   If youre thirsty, drink water. Coffee and tea are good in moderation, but skip sugary drinks and limit milk and dairy products to one or two daily servings.   The type of carbohydrate in the diet is more important than the amount. Some sources of carbohydrates, such as vegetables, fruits, whole grains, and beans-are healthier than others.   Finally, stay active  Signed, Berniece Salines, DO  07/10/2020 10:02 AM    [place]

## 2020-07-11 ENCOUNTER — Telehealth: Payer: Self-pay | Admitting: Internal Medicine

## 2020-07-11 NOTE — Telephone Encounter (Signed)
Arti from [facility] called   Needs instructions for the  apixaban (ELIQUIS) 5 MG TABS tablet   Pharmacy info:  [place], [place] to [facility] (Ph: [phone])  Please give her a call back at [phone] Reference # [id number]

## 2020-07-12 ENCOUNTER — Ambulatory Visit
Admission: RE | Admit: 2020-07-12 | Discharge: 2020-07-12 | Disposition: A | Discharge status: Home / Self Care | Payer: Medicare Other | Source: Ambulatory Visit | Attending: Internal Medicine | Admitting: Internal Medicine

## 2020-07-12 ENCOUNTER — Ambulatory Visit (HOSPITAL_COMMUNITY)
Admission: RE | Admit: 2020-07-12 | Discharge: 2020-07-12 | Disposition: A | Discharge status: Home / Self Care | Payer: Medicare Other | Source: Ambulatory Visit | Attending: Cardiology | Admitting: Cardiology

## 2020-07-12 ENCOUNTER — Other Ambulatory Visit: Payer: Self-pay

## 2020-07-12 DIAGNOSIS — R0602 Shortness of breath: Secondary | ICD-10-CM | POA: Diagnosis not present

## 2020-07-12 DIAGNOSIS — I351 Nonrheumatic aortic (valve) insufficiency: Secondary | ICD-10-CM | POA: Diagnosis not present

## 2020-07-12 DIAGNOSIS — I7121 Aneurysm of the ascending aorta, without rupture: Secondary | ICD-10-CM

## 2020-07-12 DIAGNOSIS — I712 Thoracic aortic aneurysm, without rupture, unspecified: Secondary | ICD-10-CM

## 2020-07-12 LAB — ECHOCARDIOGRAM COMPLETE
Area-P 1/2: 3.17 cm2
S' Lateral: 2.4 cm

## 2020-07-12 MED ORDER — IOPAMIDOL (ISOVUE-370) INJECTION 76%
75.0000 mL | Freq: Once | INTRAVENOUS | Status: AC | PRN
  Administered 2020-07-12: 75 mL via INTRAVENOUS

## 2020-07-12 NOTE — Progress Notes (Signed)
  Echocardiogram 2D Echocardiogram has been performed.  Johny Chess 07/12/2020, 2:44 PM

## 2020-07-13 ENCOUNTER — Encounter: Payer: Self-pay | Admitting: Internal Medicine

## 2020-07-15 ENCOUNTER — Other Ambulatory Visit: Payer: Self-pay

## 2020-07-15 DIAGNOSIS — I712 Thoracic aortic aneurysm, without rupture: Secondary | ICD-10-CM

## 2020-07-15 DIAGNOSIS — I7121 Aneurysm of the ascending aorta, without rupture: Secondary | ICD-10-CM

## 2020-07-15 NOTE — Progress Notes (Signed)
A,b re

## 2020-07-19 ENCOUNTER — Telehealth: Payer: Self-pay | Admitting: Cardiology

## 2020-07-19 NOTE — Telephone Encounter (Signed)
Left detailed message to notify pt that he is OK to proceed with surgery

## 2020-07-19 NOTE — Telephone Encounter (Signed)
Pre-op covering staff, I don't see a clearance form for this patient. Is this in regard to his hand surgery? Can you please contact surgeon's office and get clearance form information?  Thank you!

## 2020-07-19 NOTE — Telephone Encounter (Signed)
   [facility] Pre-operative Risk Assessment    Request for surgical clearance:   URGENT  1. What type of surgery is being performed? RIGHT THUMB METACARPAL-ARTHROPLASTY   2. When is this surgery scheduled? 07-22-2020   3. What type of clearance is required (medical clearance vs. Pharmacy clearance to hold med vs. Both)? MEDICAL  4. Are there any medications that need to be held prior to surgery and how long? NONE  5. Practice name and name of physician performing surgery? [facility]- Dr Caralyn Guile  6. What is the office phone number? [phone]   7.   What is the office fax number? [phone]  8.   Anesthesia type (None, local, MAC, general) ? REGIONAL WITH iv SEDATION

## 2020-07-19 NOTE — Telephone Encounter (Signed)
   Primary Cardiologist: Berniece Salines, DO  Chart reviewed as part of pre-operative protocol coverage. Patient was recently seen by Dr. Harriet Masson on 07/10/2020 for pre-op evaluation for upcoming hand surgery. Chest CTA was ordered to monitor thoracic aortic aneurysm and Echo was ordered for further pre-op risk assessment. Aneurysm was stable on CT and Echo showed normal LV function.   Per Dr. Terrial Rhodes result note on 07/12/2020, "Based on his METS >4.0 and his normal Echo, since no chest pain we should be able to clear him." Therefore, OK to proceed with surgery.  I will route this recommendation to the requesting party via Epic fax function and remove from pre-op pool.  Please call with questions.  Luis Mclean, PA-C 07/19/2020, 11:54 AM

## 2020-07-19 NOTE — Telephone Encounter (Signed)
Follow Up:    Luis Wilkinson is calling to check on the status of pt's clearance. She needs this asap please, pt surgery is Monday(07-22-20). Please fax to [phone]

## 2020-07-19 NOTE — Telephone Encounter (Signed)
OK to proceed with surgery-forwarded to requesting party via [place] fax function

## 2020-07-24 ENCOUNTER — Other Ambulatory Visit: Payer: Medicare Other

## 2020-07-26 ENCOUNTER — Encounter: Payer: Self-pay | Admitting: Internal Medicine

## 2020-07-28 ENCOUNTER — Encounter: Payer: Self-pay | Admitting: Internal Medicine

## 2020-07-30 NOTE — Telephone Encounter (Signed)
Verdis Frederickson to refer both husband and wife please  Please also document this in the wife's chart as well that this was done

## 2020-07-30 NOTE — Telephone Encounter (Signed)
I was able to send in referral for the infusion clinic.

## 2020-08-06 ENCOUNTER — Telehealth (INDEPENDENT_AMBULATORY_CARE_PROVIDER_SITE_OTHER): Payer: Medicare Other | Admitting: Family Medicine

## 2020-08-06 ENCOUNTER — Encounter: Payer: Self-pay | Admitting: Family Medicine

## 2020-08-06 VITALS — Ht 70.0 in | Wt 172.0 lb

## 2020-08-06 DIAGNOSIS — U071 COVID-19: Secondary | ICD-10-CM

## 2020-08-06 DIAGNOSIS — R0981 Nasal congestion: Secondary | ICD-10-CM

## 2020-08-06 MED ORDER — DOXYCYCLINE HYCLATE 100 MG PO TABS
100.0000 mg | ORAL_TABLET | Freq: Two times a day (BID) | ORAL | 0 refills | Status: DC
Start: 2020-08-06 — End: 2021-01-02

## 2020-08-06 NOTE — Progress Notes (Signed)
Virtual Visit via Video Note  I connected with Luis Wilkinson  on 08/06/20 at 12:40 PM EST by a video enabled telemedicine application and verified that I am speaking with the correct person using two identifiers.  Location patient: home, [place] Location provider:work or home office Persons participating in the virtual visit: patient, provider  I discussed the limitations of evaluation and management by telemedicine and the availability of in person appointments. The patient expressed understanding and agreed to proceed.   HPI:  Acute telemedicine visit for COVID19: -Onset: 2 weeks ago, tested positive for covid 07/26/2020 -went to [facility] ER 08/03/20 per his report -Symptoms include: nasal congestion, cough, fatigue, achy, now with thick sinus pain and thick mucus, loss of taste or smell reports "covid is no joke" -cough is not bad now, rarely using alb -O2 98 -Denies:  Fevers, vomiting, cp, sob, inability to get out of bed and eat and drink -Has tried:alb rarely prn -Pertinent past medical history:asthma, has inhaler - has not needed it much -Pertinent medication allergies: see chart -COVID-19 vaccine status:not vaccinated  ROS: See pertinent positives and negatives per HPI.  Past Medical History:  Diagnosis Date   Aneurysm, ascending aorta ([place])    followed br Dr Hortencia Pilar   History of kidney stones    History of partial colectomy 12/18/2019   Hypertension    RA (rheumatoid arthritis) ([place])    S/P right unicompartmental knee replacement    06-20-2015  post dislocating plastic    Seronegative rheumatoid arthritis ([place]) 12/18/2019    Past Surgical History:  Procedure Laterality Date   CARDIAC CATHETERIZATION  Feb 2015    [place]   per pt normal coronary arteries   CATARACT EXTRACTION W/ INTRAOCULAR LENS IMPLANT Right Mar 2014   CYSTOSCOPY W/ URETEROSCOPY W/ LITHOTRIPSY     EXCISION SUBDERMAL NECK TUMOR  2010   benign   KNEE ARTHROSCOPY W/ MENISCECTOMY Right 01-09-2015    LEFT ELBOW RECONSTRUCTION  2010   PARTIAL KNEE ARTHROPLASTY Right 08/08/2015   Procedure: RIGHT UNICOMPARTMENTAL KNEE REVISION PLASTIC;  Surgeon: Paralee Cancel, MD;  Location: [facility];  Service: Orthopedics;  Laterality: Right;   PARTIAL KNEE ARTHROPLASTY Right 03/02/2016   Procedure: UNICOMPARTMENTAL RIGHT KNEE REVISION;  Surgeon: Paralee Cancel, MD;  Location: [facility];  Service: Orthopedics;  Laterality: Right;   REPLACEMENT UNICONDYLAR JOINT KNEE Right 06-20-2015   STRABISMUS SURGERY Left x6   last one --Age 64     Current Outpatient Medications:    atenolol (TENORMIN) 100 MG tablet, One in am and a half in pm, Disp: 135 tablet, Rfl: 3   doxycycline (VIBRA-TABS) 100 MG tablet, Take 1 tablet (100 mg total) by mouth 2 (two) times daily., Disp: 20 tablet, Rfl: 0   DULoxetine (CYMBALTA) 60 MG capsule, Take 1 capsule (60 mg total) by mouth daily., Disp: 90 capsule, Rfl: 3   Glucosamine-Chondroit-Vit C-Mn (GLUCOSAMINE 1500 COMPLEX PO), Take 2 capsules by mouth daily., Disp: , Rfl:    Multiple Vitamin (MULTIVITAMIN) tablet, Take 1 tablet by mouth every morning., Disp: , Rfl:    pantoprazole (PROTONIX) 40 MG tablet, Take 30- 60 min before your first and last meals of the day, Disp: 180 tablet, Rfl: 3   telmisartan-hydrochlorothiazide (MICARDIS HCT) 80-25 MG tablet, Take 1 tablet by mouth daily., Disp: 90 tablet, Rfl: 3   temazepam (RESTORIL) 15 MG capsule, Take 1 capsule (15 mg total) by mouth at bedtime as needed for sleep., Disp: 90 capsule, Rfl: 1   Turmeric 500 MG CAPS,  Take by mouth., Disp: , Rfl:    acyclovir ointment (ZOVIRAX) 5 %, acyclovir 5 % topical ointment (Patient not taking: No sig reported), Disp: , Rfl:    apixaban (ELIQUIS) 5 MG TABS tablet, Eliquis 5 mg tablet (Patient not taking: No sig reported), Disp: 180 tablet, Rfl: 3   atorvastatin (LIPITOR) 10 MG tablet, Take 1 tablet (10 mg total) by mouth daily. (Patient not taking: No sig reported), Disp: 90  tablet, Rfl: 3   diazepam (VALIUM) 5 MG tablet, 1 tab by mouth 60 min prior to procedure (Patient not taking: No sig reported), Disp: 1 tablet, Rfl: 0   leflunomide (ARAVA) 20 MG tablet, Take 20 mg by mouth daily. (Patient not taking: Reported on 08/06/2020), Disp: , Rfl:    loratadine-pseudoephedrine (CLARITIN-D 24-HOUR) 10-240 MG 24 hr tablet, Take 1 tablet by mouth daily as needed for allergies. (Patient not taking: No sig reported), Disp: , Rfl:    mupirocin nasal ointment (BACTROBAN) 2 %, Place 1 application into the nose 2 (two) times daily. Use one-half of tube in each nostril twice daily for five (5) days. After application, press sides of nose together and gently massage. (Patient not taking: No sig reported), Disp: 10 g, Rfl: 2   mupirocin ointment (BACTROBAN) 2 %, mupirocin 2 % topical ointment (Patient not taking: No sig reported), Disp: , Rfl:   EXAM:  VITALS per patient if applicable:  GENERAL: alert, oriented, appears well and in no acute distress  HEENT: atraumatic, conjunttiva clear, no obvious abnormalities on inspection of external nose and ears  NECK: normal movements of the head and neck  LUNGS: on inspection no signs of respiratory distress, breathing rate appears normal, no obvious gross SOB, gasping or wheezing  CV: no obvious cyanosis  MS: moves all visible extremities without noticeable abnormality  PSYCH/NEURO: pleasant and cooperative, no obvious depression or anxiety, speech and thought processing grossly intact  ASSESSMENT AND PLAN:  Discussed the following assessment and plan:  Nasal sinus congestion  COVID-19  -we discussed possible serious and likely etiologies, options for evaluation and workup, limitations of telemedicine visit vs in person visit, treatment, treatment risks and precautions. Pt prefers to treat via telemedicine empirically rather than in person at this moment.  Query sinusitis given duration of symptoms with thick nasal sinus  discharge and sinus discomfort.  He opted for treatment with doxycycline for this.  Discussed vitamin D, vitamin C, staying hydrated and long-haul Covid, potential complications and precautions.  Discussed at this point he is out of the treatment window for Covid treatments.  Advised to seek prompt in person care if worsening, new symptoms arise, or if is not improving with treatment. Discussed options for inperson care if PCP office not available. Did let this patient know that I only do telemedicine on Tuesdays and Thursdays for [facility]. Advised to schedule follow up visit with PCP or [facility] if any further questions or concerns to avoid delays in care.   I discussed the assessment and treatment plan with the patient. The patient was provided an opportunity to ask questions and all were answered. The patient agreed with the plan and demonstrated an understanding of the instructions.     Terressa Koyanagi, DO

## 2020-08-12 ENCOUNTER — Encounter: Payer: Self-pay | Admitting: Cardiothoracic Surgery

## 2020-08-12 ENCOUNTER — Other Ambulatory Visit: Payer: Self-pay

## 2020-08-12 ENCOUNTER — Institutional Professional Consult (permissible substitution) (INDEPENDENT_AMBULATORY_CARE_PROVIDER_SITE_OTHER): Payer: Medicare Other | Admitting: Cardiothoracic Surgery

## 2020-08-12 VITALS — BP 128/84 | HR 70 | Temp 97.3°F | Resp 20 | Ht 70.0 in

## 2020-08-12 DIAGNOSIS — I7121 Aneurysm of the ascending aorta, without rupture: Secondary | ICD-10-CM

## 2020-08-12 DIAGNOSIS — I712 Thoracic aortic aneurysm, without rupture: Secondary | ICD-10-CM | POA: Diagnosis not present

## 2020-08-12 NOTE — Progress Notes (Signed)
[address]       [place] [address]             [phone]     CARDIOTHORACIC SURGERY office visitation  Referring Provider is Biagio Borg, MD Primary Cardiologist is Berniece Salines, DO PCP is Biagio Borg, MD  Chief Complaint  Patient presents with  . Thoracic Aortic Aneurysm    CTA chest 07/12/20, ECHO 07/12/20    HPI:  65 year old man with known history of ascending aortic aneurysm presents for surgical evaluation.  He also has a history of hypertension which is reasonably well controlled with oral medications.  He has a family history of aneurysmal disease with his mother having cerebral aneurysms and her brother having a chest aneurysm.  This is nonsyndromic. The patient also has a history of several manifestations of connective tissue disorders including rheumatoid arthritis and osteoarthritis.  He has a history of cataract surgery By his report, his first measurement of ascending aorta was approximately 4.4 or 4.5 cm in diameter.  More recently this measures 4.8 cm in maximal diameter approximately.  He denies any chest pain or shortness of breath; he has never had any chest surgery or abdominal surgery but is status post several different orthopedic surgeries  Past Medical History:  Diagnosis Date  . Aneurysm, ascending aorta ([facility])    followed br Dr Hortencia Pilar  . History of kidney stones   . History of partial colectomy 12/18/2019  . Hypertension   . RA (rheumatoid arthritis) ([place])   . 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 Seronegative rheumatoid arthritis ([place]) 12/18/2019    Past Surgical History:  Procedure Laterality Date  . CARDIAC CATHETERIZATION  Feb 2015    [place]   per pt normal coronary arteries  . CATARACT EXTRACTION W/ INTRAOCULAR LENS IMPLANT Right Mar 2014  . CYSTOSCOPY W/ URETEROSCOPY W/ LITHOTRIPSY    . EXCISION SUBDERMAL NECK TUMOR  2010   benign  . KNEE ARTHROSCOPY W/  MENISCECTOMY Right 01-09-2015  . LEFT ELBOW RECONSTRUCTION  2010  . PARTIAL KNEE ARTHROPLASTY Right 08/08/2015   Procedure: RIGHT UNICOMPARTMENTAL KNEE REVISION PLASTIC;  Surgeon: Paralee Cancel, MD;  Location: [facility];  Service: Orthopedics;  Laterality: Right;  . PARTIAL KNEE ARTHROPLASTY Right 03/02/2016   Procedure: UNICOMPARTMENTAL RIGHT KNEE REVISION;  Surgeon: Paralee Cancel, MD;  Location: [facility];  Service: Orthopedics;  Laterality: Right;  . REPLACEMENT UNICONDYLAR JOINT KNEE Right 06-20-2015  . STRABISMUS SURGERY Left x6   last one --Age 44    Family History  Problem Relation Age of Onset  . Congestive Heart Failure Mother   . Congestive Heart Failure Father     Social History   Socioeconomic History  . Marital status: Married    Spouse name: Not on file  . Number of children: Not on file  . Years of education: Not on file  . Highest education level: Not on file  Occupational History  . Not on file  Tobacco Use  . Smoking status: Former Smoker    Packs/day: 0.50    Years: 10.00    Pack years: 5.00    Types: Cigarettes    Quit date: 07/19/1994    Years since quitting: 26.0  . Smokeless tobacco: Never Used  Substance and Sexual Activity  . Alcohol use: No  . Drug use: No  . Sexual activity: Not on file  Other Topics Concern  .  Not on file  Social History Narrative  . Not on file   Social Determinants of Health   Financial Resource Strain: Not on file  Food Insecurity: Not on file  Transportation Needs: Not on file  Physical Activity: Not on file  Stress: Not on file  Social Connections: Not on file  Intimate Partner Violence: Not on file    Current Outpatient Medications  Medication Sig Dispense Refill  . apixaban (ELIQUIS) 5 MG TABS tablet Eliquis 5 mg tablet 180 tablet 3  . atenolol (TENORMIN) 100 MG tablet One in am and a half in pm 135 tablet 3  . atorvastatin (LIPITOR) 10 MG tablet Take 1 tablet (10 mg total) by mouth daily. 90  tablet 3  . diazepam (VALIUM) 5 MG tablet 1 tab by mouth 60 min prior to procedure 1 tablet 0  . doxycycline (VIBRA-TABS) 100 MG tablet Take 1 tablet (100 mg total) by mouth 2 (two) times daily. 20 tablet 0  . DULoxetine (CYMBALTA) 60 MG capsule Take 1 capsule (60 mg total) by mouth daily. 90 capsule 3  . Glucosamine-Chondroit-Vit C-Mn (GLUCOSAMINE 1500 COMPLEX PO) Take 2 capsules by mouth daily.    Marland Kitchen leflunomide (ARAVA) 20 MG tablet Take 20 mg by mouth daily.    Marland Kitchen loratadine-pseudoephedrine (CLARITIN-D 24-HOUR) 10-240 MG 24 hr tablet Take 1 tablet by mouth daily as needed for allergies.    . Multiple Vitamin (MULTIVITAMIN) tablet Take 1 tablet by mouth every morning.    . pantoprazole (PROTONIX) 40 MG tablet Take 30- 60 min before your first and last meals of the day 180 tablet 3  . telmisartan-hydrochlorothiazide (MICARDIS HCT) 80-25 MG tablet Take 1 tablet by mouth daily. 90 tablet 3  . temazepam (RESTORIL) 15 MG capsule Take 1 capsule (15 mg total) by mouth at bedtime as needed for sleep. 90 capsule 1  . Turmeric 500 MG CAPS Take by mouth.    Marland Kitchen acyclovir ointment (ZOVIRAX) 5 % acyclovir 5 % topical ointment (Patient not taking: Reported on 08/12/2020)    . mupirocin nasal ointment (BACTROBAN) 2 % Place 1 application into the nose 2 (two) times daily. Use one-half of tube in each nostril twice daily for five (5) days. After application, press sides of nose together and gently massage. (Patient not taking: Reported on 08/12/2020) 10 g 2  . mupirocin ointment (BACTROBAN) 2 % mupirocin 2 % topical ointment (Patient not taking: Reported on 08/12/2020)     No current facility-administered medications for this visit.    Allergies  Allergen Reactions  . Dilaudid [Hydromorphone] Shortness Of Breath and Other (See Comments)    Chest tight  . Hydroxychloroquine Sulfate Swelling and Itching  . Morphine Shortness Of Breath    SEVERE  . Hydroxychloroquine Rash  . Infliximab Rash    SEVERE       Review of Systems:   General:  No weight change or change in energy  Cardiac:  As per HPI  Respiratory:  Occasional wheezing with exertion  GI:   Denies heartburn or abdominal pain  GU:   No history of dysuria or prostate disease  Vascular:  History of DVTs and pulmonary embolism  Neuro:   Denies strokes or TIAs  Musculoskeletal: Multiple manifestations of arthritis  Skin:   No rashes  Psych:   No history of stress or anxiety  Eyes:   History of cataract and surgery  ENT:   Negative  Hematologic:  Does report easy bruising as a consequence of anticoagulation  Endocrine:  Not diabetic,      Physical Exam:   BP 128/84 (BP Location: Left Arm, Patient Position: Sitting)   Pulse 70   Temp (!) 97.3 F (36.3 C)   Resp 20   Ht 5\' 10"  (1.778 m)   SpO2 97% Comment: RA with mask on  BMI 24.68 kg/m   General:   well-appearing  HEENT:  Unremarkable   Neck:   no JVD, no bruits, no adenopathy   Chest:   clear to auscultation, symmetrical breath sounds, no wheezes, no rhonchi   CV:   RRR, no murmur   Abdomen:  soft, non-tender, no masses   Extremities:  warm, well-perfused, pulses intact throughout, no LE edema  Rectal/GU  Deferred  Neuro:   Grossly non-focal and symmetrical throughout  Skin:   Clean and dry, no rashes, no breakdown   Diagnostic Tests:  I have personally reviewed his available imaging including echocardiogram and chest CT from 12/21 and agree with interpretations   Impression:  65 yo man with known ascending aortic aneurysm and family hx of aneurysm disease   Plan:  F/u in 6 months with repeat CT chest (noncontrast) Report any chest pain or upper back pain to local ED Strict BP control   I spent in excess of 30 minutes during the conduct of this office consultation and >50% of this time involved direct face-to-face encounter with the patient for counseling and/or coordination of their care.          Level 3 Office Consult = 40 minutes          Level 4 Office Consult = 60 minutes         Level 5 Office Consult = 80 minutes  B. Murvin Natal, MD 08/12/2020 3:09 PM

## 2020-08-17 IMAGING — DX CHEST - 2 VIEW
2 series · 2 of 2 positions shown · non-contrast
Comparison: None

CLINICAL DATA: Dyspnea on exertion for 5 weeks, hypertension,
former smoker

EXAM:
CHEST - 2 VIEW

[chest pa]
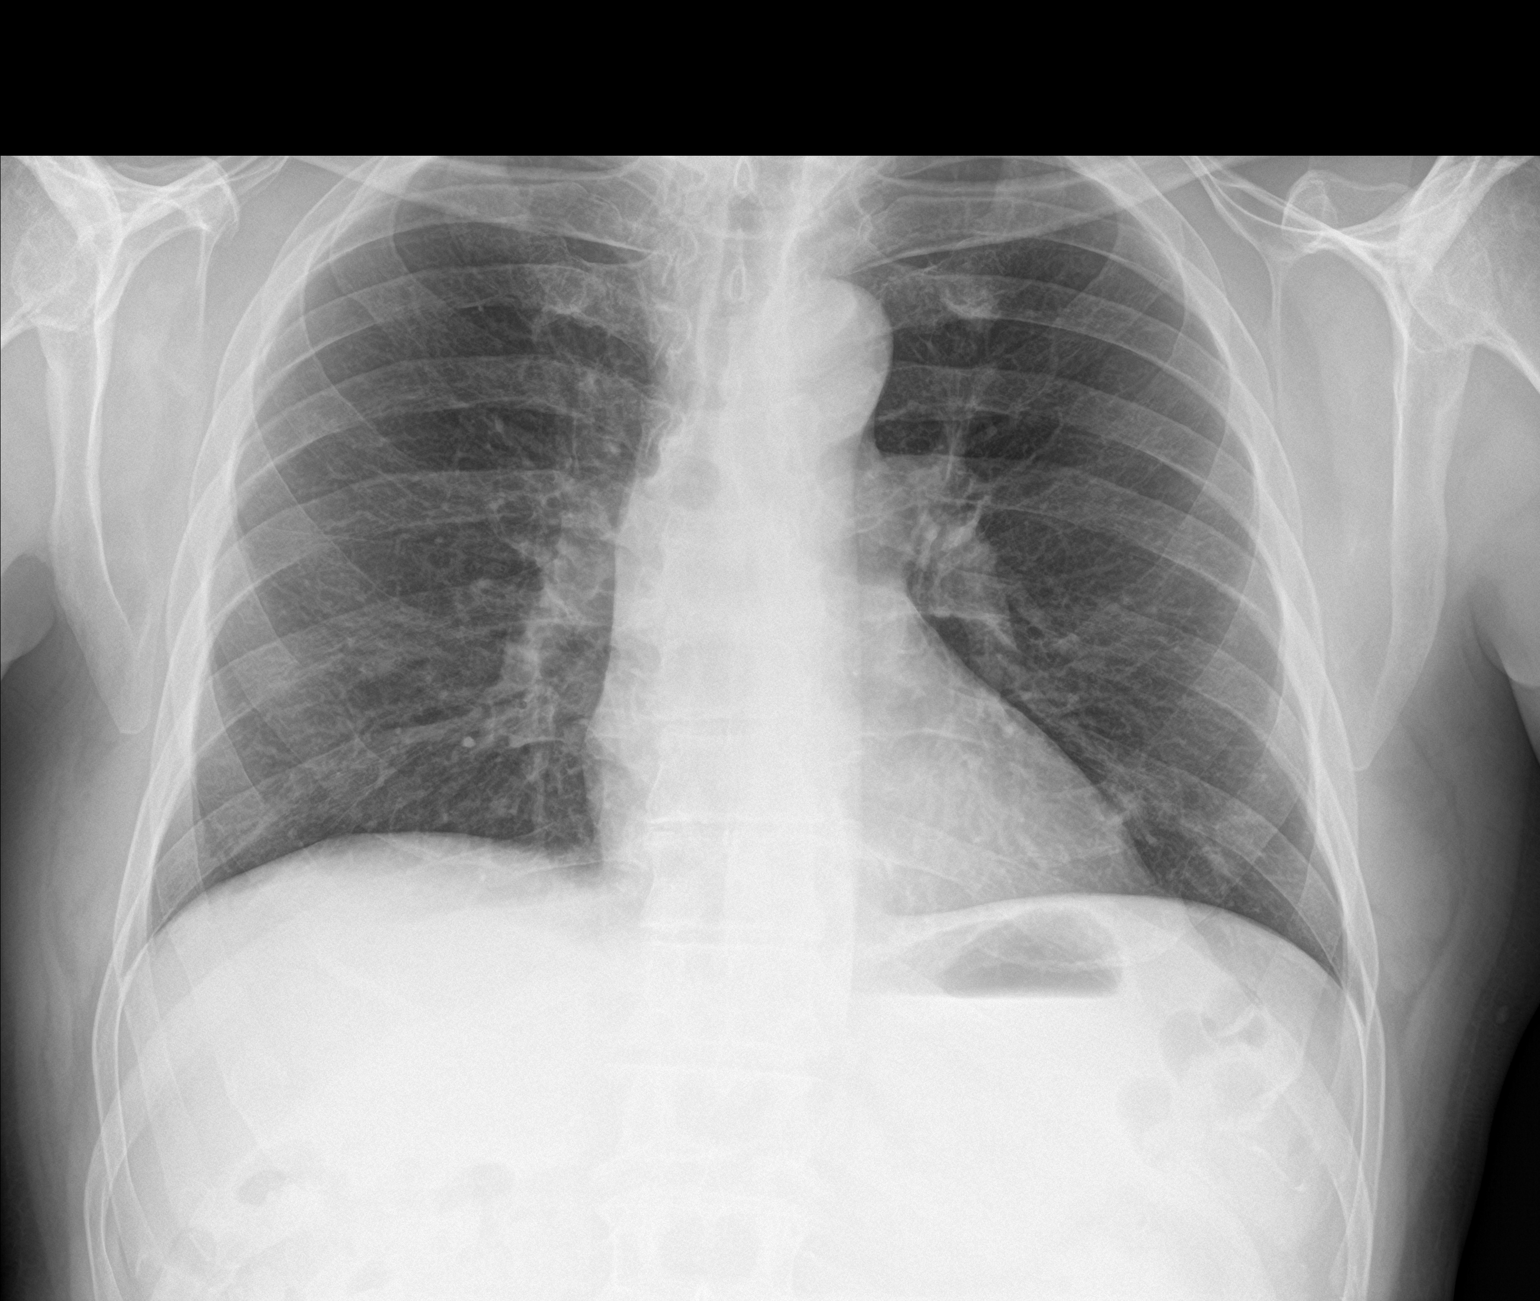

[chest lat]
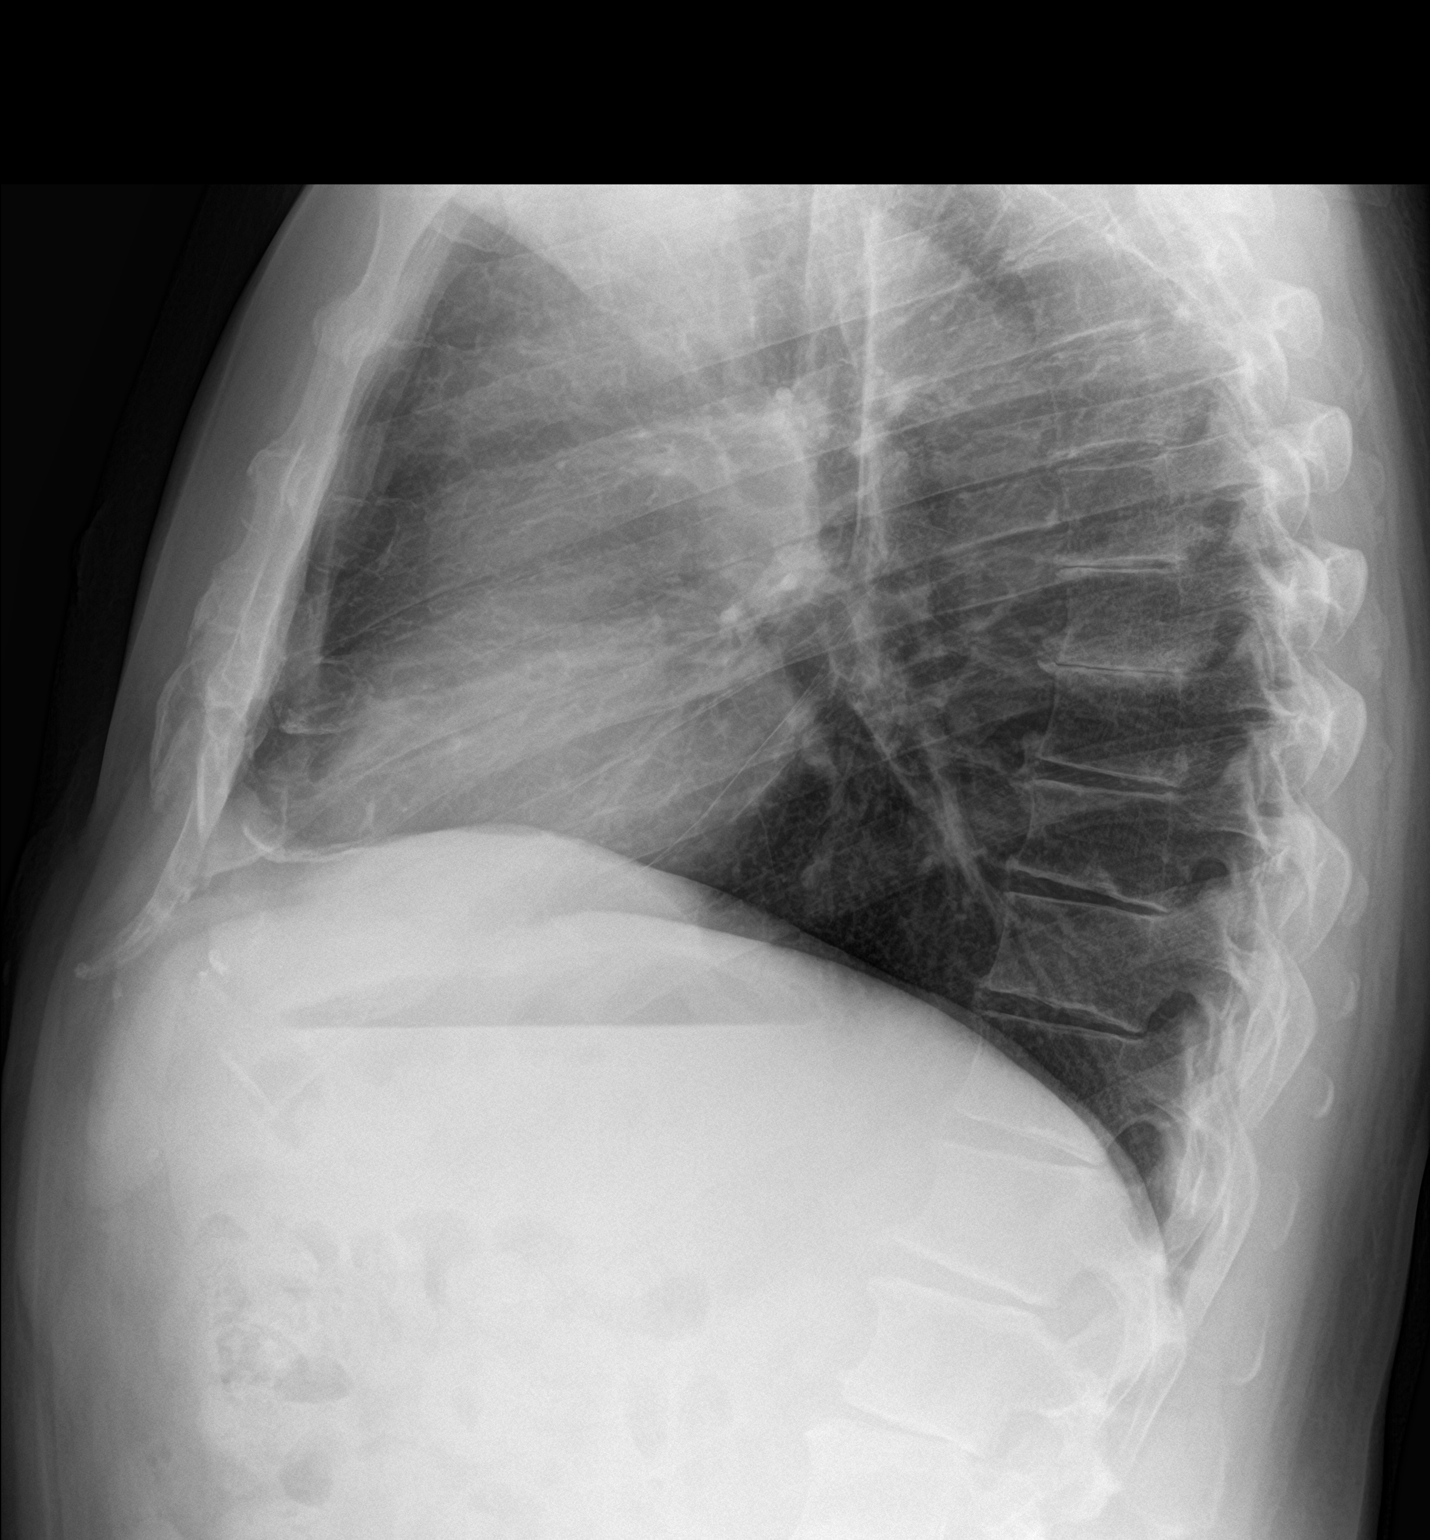

[2 of 2 positions shown; findings below may reference images not displayed]

FINDINGS: Normal heart size, mediastinal contours, and pulmonary vascularity.

Lungs clear.

No infiltrate, pleural effusion, or pneumothorax.

Bones demineralized.
IMPRESSION: No acute abnormalities.

## 2020-10-08 ENCOUNTER — Ambulatory Visit (INDEPENDENT_AMBULATORY_CARE_PROVIDER_SITE_OTHER): Payer: Medicare Other | Admitting: Cardiology

## 2020-10-08 ENCOUNTER — Other Ambulatory Visit: Payer: Self-pay

## 2020-10-08 ENCOUNTER — Encounter: Payer: Self-pay | Admitting: Cardiology

## 2020-10-08 VITALS — BP 110/72 | HR 84 | Ht 70.0 in | Wt 187.6 lb

## 2020-10-08 DIAGNOSIS — I719 Aortic aneurysm of unspecified site, without rupture: Secondary | ICD-10-CM

## 2020-10-08 DIAGNOSIS — R0602 Shortness of breath: Secondary | ICD-10-CM

## 2020-10-08 NOTE — Progress Notes (Signed)
Cardiology Office Note:    Date:  10/08/2020   ID:  Luis Wilkinson, DOB 1955/11/18, MRN [id number]  PCP:  Biagio Borg, MD  Cardiologist:  Berniece Salines, DO  Electrophysiologist:  None   Referring MD: Biagio Borg, MD   I am doing a lot better  History of Present Illness:    Luis Wilkinson is a 65 y.o. male with a hx of hx of rheumatoid arthritis, current smoker, heart catheterization in 2015 with normal coronary arteries, ascending thoracic aortic aneurysm last reported to be 4.4cm in December 2021, and hypertension.  I did see the patient back in December for a operative clearance for hand surgery.  We will repeat an echocardiogram and a CTA given his history of ascending thoracic aneurysm.  I also referred the patient to vascular surgery.  He was intermittently cleared for his hand surgery.  He was able to get his hand surgery.  He also has seen vascular surgeon.  He is doing well.  Past Medical History:  Diagnosis Date  . Aneurysm, ascending aorta (HCC)    followed br Dr Hortencia Pilar  . History of kidney stones   . History of partial colectomy 12/18/2019  . Hypertension   . RA (rheumatoid arthritis) ([place])   . 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 Seronegative rheumatoid arthritis ([place]) 12/18/2019    Past Surgical History:  Procedure Laterality Date  . CARDIAC CATHETERIZATION  Feb 2015    [place]   per pt normal coronary arteries  . CATARACT EXTRACTION W/ INTRAOCULAR LENS IMPLANT Right Mar 2014  . CYSTOSCOPY W/ URETEROSCOPY W/ LITHOTRIPSY    . EXCISION SUBDERMAL NECK TUMOR  2010   benign  . KNEE ARTHROSCOPY W/ MENISCECTOMY Right 01-09-2015  . LEFT ELBOW RECONSTRUCTION  2010  . PARTIAL KNEE ARTHROPLASTY Right 08/08/2015   Procedure: RIGHT UNICOMPARTMENTAL KNEE REVISION PLASTIC;  Surgeon: Paralee Cancel, MD;  Location: [facility];  Service: Orthopedics;  Laterality: Right;  . PARTIAL KNEE ARTHROPLASTY Right  03/02/2016   Procedure: UNICOMPARTMENTAL RIGHT KNEE REVISION;  Surgeon: Paralee Cancel, MD;  Location: [facility];  Service: Orthopedics;  Laterality: Right;  . REPLACEMENT UNICONDYLAR JOINT KNEE Right 06-20-2015  . STRABISMUS SURGERY Left x6   last one --Age 18    Current Medications: Current Meds  Medication Sig  . acyclovir ointment (ZOVIRAX) 5 %   . apixaban (ELIQUIS) 5 MG TABS tablet Eliquis 5 mg tablet  . atenolol (TENORMIN) 100 MG tablet One in am and a half in pm  . atorvastatin (LIPITOR) 10 MG tablet Take 1 tablet (10 mg total) by mouth daily.  . diazepam (VALIUM) 5 MG tablet 1 tab by mouth 60 min prior to procedure  . doxycycline (VIBRA-TABS) 100 MG tablet Take 1 tablet (100 mg total) by mouth 2 (two) times daily.  . DULoxetine (CYMBALTA) 60 MG capsule Take 1 capsule (60 mg total) by mouth daily.  . Glucosamine-Chondroit-Vit C-Mn (GLUCOSAMINE 1500 COMPLEX PO) Take 2 capsules by mouth daily.  Marland Kitchen leflunomide (ARAVA) 20 MG tablet Take 20 mg by mouth daily.  Marland Kitchen loratadine-pseudoephedrine (CLARITIN-D 24-HOUR) 10-240 MG 24 hr tablet Take 1 tablet by mouth daily as needed for allergies.  . Multiple Vitamin (MULTIVITAMIN) tablet Take 1 tablet by mouth every morning.  . mupirocin nasal ointment (BACTROBAN) 2 % Place 1 application into the nose 2 (two) times daily. Use one-half of tube in each nostril twice daily for five (5) days. After application,  press sides of nose together and gently massage.  . mupirocin ointment (BACTROBAN) 2 %   . pantoprazole (PROTONIX) 40 MG tablet Take 30- 60 min before your first and last meals of the day  . telmisartan-hydrochlorothiazide (MICARDIS HCT) 80-25 MG tablet Take 1 tablet by mouth daily.  . temazepam (RESTORIL) 15 MG capsule Take 1 capsule (15 mg total) by mouth at bedtime as needed for sleep.  . Turmeric 500 MG CAPS Take by mouth.     Allergies:   Dilaudid [hydromorphone], Hydroxychloroquine sulfate, Morphine, Hydroxychloroquine, and Infliximab   Social  History   Socioeconomic History  . Marital status: Married    Spouse name: Not on file  . Number of children: Not on file  . Years of education: Not on file  . Highest education level: Not on file  Occupational History  . Not on file  Tobacco Use  . Smoking status: Former Smoker    Packs/day: 0.50    Years: 10.00    Pack years: 5.00    Types: Cigarettes    Quit date: 07/19/1994    Years since quitting: 26.2  . Smokeless tobacco: Never Used  Substance and Sexual Activity  . Alcohol use: No  . Drug use: No  . Sexual activity: Not on file  Other Topics Concern  . Not on file  Social History Narrative  . Not on file   Social Determinants of Health   Financial Resource Strain: Not on file  Food Insecurity: Not on file  Transportation Needs: Not on file  Physical Activity: Not on file  Stress: Not on file  Social Connections: Not on file     Family History: The patient's family history includes Congestive Heart Failure in his father and mother.  ROS:   Review of Systems  Constitution: Negative for decreased appetite, fever and weight gain.  HENT: Negative for congestion, ear discharge, hoarse voice and sore throat.   Eyes: Negative for discharge, redness, vision loss in right eye and visual halos.  Cardiovascular: Negative for chest pain, dyspnea on exertion, leg swelling, orthopnea and palpitations.  Respiratory: Negative for cough, hemoptysis, shortness of breath and snoring.   Endocrine: Negative for heat intolerance and polyphagia.  Hematologic/Lymphatic: Negative for bleeding problem. Does not bruise/bleed easily.  Skin: Negative for flushing, nail changes, rash and suspicious lesions.  Musculoskeletal: Negative for arthritis, joint pain, muscle cramps, myalgias, neck pain and stiffness.  Gastrointestinal: Negative for abdominal pain, bowel incontinence, diarrhea and excessive appetite.  Genitourinary: Negative for decreased libido, genital sores and incomplete  emptying.  Neurological: Negative for brief paralysis, focal weakness, headaches and loss of balance.  Psychiatric/Behavioral: Negative for altered mental status, depression and suicidal ideas.  Allergic/Immunologic: Negative for HIV exposure and persistent infections.    EKGs/Labs/Other Studies Reviewed:    The following studies were reviewed today:   EKG: None today  CTA of the chest December 2021  IMPRESSION: 1. Mild aneurysmal dilation of the ascending thoracic aorta with a maximal diameter of 4.4 cm consistent with mild interval growth compared to 4.1 cm (by my measurements) in 2016. Recommend annual imaging followup by CTA or MRA. This recommendation follows 2010 ACCF/AHA/AATS/ACR/ASA/SCA/SCAI/SIR/STS/SVM Guidelines for the Diagnosis and Management of Patients with Thoracic Aortic Disease. Circulation. 2010; 121: K539-J673. Aortic aneurysm NOS (ICD10-I71.9)  2. Interval development of a 0.7 cm ground-glass attenuation  pulmonary nodule in the anterior aspect of the lingula. Initial follow-up with CT at 6-12 months is recommended to confirm persistence. If persistent, repeat CT is recommended  every 2 years until 5 years of stability has been established. This recommendation follows the consensus statement: Guidelines for Management of Incidental Pulmonary Nodules Detected on CT Images: From the Fleischner Society 2017; Radiology 2017; 284:228-243. 3. Nonobstructing bilateral nephrolithiasis.  Signed,  Criselda Peaches, MD, [place]  [facility]  [facility]   Electronically Signed   By: Jacqulynn Cadet M.D.   On: 07/12/2020 15:50   Transthoracic echocardiogram July 12, 2020 IMPRESSIONS  1. Left ventricular ejection fraction, by estimation, is 60 to 65%. The left ventricle has normal function. The left ventricle has no regional wall motion abnormalities. Left ventricular diastolic parameters were normal.  2. Right  ventricular systolic function is normal. The right ventricular size is normal. There is normal pulmonary artery systolic pressure.  3. The mitral valve is normal in structure. Trivial mitral valve regurgitation. No evidence of mitral stenosis.  4. The aortic valve is tricuspid. Aortic valve regurgitation is mild to moderate. No aortic stenosis is present.  5. Aortic dilatation noted. There is mild dilatation of the ascending aorta, measuring 42 mm. There is mild dilatation of the aortic root, measuring 39 mm.  6. The inferior vena cava is normal in size with greater than 50% respiratory variability, suggesting right atrial pressure of 3 mmHg.   Recent Labs: 12/18/2019: ALT 13; BUN 19; Creatinine, Ser 1.17; Potassium 3.9; Sodium 141; TSH 0.67 07/03/2020: Hemoglobin 12.7; Platelets 255.0  Recent Lipid Panel    Component Value Date/Time   CHOL 136 12/18/2019 1524   TRIG 201.0 (H) 12/18/2019 1524   HDL 34.30 (L) 12/18/2019 1524   CHOLHDL 4 12/18/2019 1524   VLDL 40.2 (H) 12/18/2019 1524   LDLDIRECT 71.0 12/18/2019 1524    Physical Exam:    VS:  BP 110/72   Pulse 84   Ht 5\' 10"  (1.778 m)   Wt 187 lb 9.6 oz (85.1 kg)   SpO2 96%   BMI 26.92 kg/m     Wt Readings from Last 3 Encounters:  10/08/20 187 lb 9.6 oz (85.1 kg)  08/06/20 172 lb (78 kg)  07/10/20 184 lb 12.8 oz (83.8 kg)     GEN: Well nourished, well developed in no acute distress HEENT: Normal NECK: No JVD; No carotid bruits LYMPHATICS: No lymphadenopathy CARDIAC: S1S2 noted,RRR, no murmurs, rubs, gallops RESPIRATORY:  Clear to auscultation without rales, wheezing or rhonchi  ABDOMEN: Soft, non-tender, non-distended, +bowel sounds, no guarding. EXTREMITIES: No edema, No cyanosis, no clubbing MUSCULOSKELETAL:  No deformity  SKIN: Warm and dry NEUROLOGIC:  Alert and oriented x 3, non-focal PSYCHIATRIC:  Normal affect, good insight  ASSESSMENT:    1. Shortness of breath   2. Aortic aneurysm without rupture,  unspecified portion of aorta (HCC)    PLAN:     1.  He is doing well from a cardiovascular standpoint.  No medication changes will be done today.  His blood pressure is acceptable.  We talked about his testing results.  He is following vascular surgeon.  Which she will follow in 6 months for reimaging.  He has had some shortness of breath he tells me, I will set the patient up for PFT to see if this is secondary to lung pathology.  If this is normal and his symptoms persist we will pursue a coronary CTA.   The patient is in agreement with the above plan. The patient left the office in stable condition.  The patient will follow up in 1 year or sooner if needed.  Medication Adjustments/Labs and Tests Ordered: Current medicines are reviewed at length with the patient today.  Concerns regarding medicines are outlined above.  Orders Placed This Encounter  Procedures  . Pulmonary function test   No orders of the defined types were placed in this encounter.   Patient Instructions  Medication Instructions:  Your physician recommends that you continue on your current medications as directed. Please refer to the Current Medication list given to you today.  *If you need a refill on your cardiac medications before your next appointment, please call your pharmacy*   Lab Work: None If you have labs (blood work) drawn today and your tests are completely normal, you will receive your results only by: Marland Kitchen MyChart Message (if you have MyChart) OR . A paper copy in the mail If you have any lab test that is abnormal or we need to change your treatment, we will call you to review the results.   Testing/Procedures: Pulmonary Function Test  Follow-Up: At [facility], you and your health needs are our priority.  As part of our continuing mission to provide you with exceptional heart care, we have created designated Provider Care Teams.  These Care Teams include your primary Cardiologist  (physician) and Advanced Practice Providers (APPs -  Physician Assistants and Nurse Practitioners) who all work together to provide you with the care you need, when you need it.  We recommend signing up for the patient portal called "MyChart".  Sign up information is provided on this After Visit Summary.  MyChart is used to connect with patients for Virtual Visits (Telemedicine).  Patients are able to view lab/test results, encounter notes, upcoming appointments, etc.  Non-urgent messages can be sent to your provider as well.   To learn more about what you can do with MyChart, go to [email or web address].    Your next appointment:   1 year(s)  The format for your next appointment:   In Person  Provider:   Berniece Salines, DO   Other Instructions      Adopting a Healthy Lifestyle.  Know what a healthy weight is for you (roughly BMI <25) and aim to maintain this   Aim for 7+ servings of fruits and vegetables daily   65-80+ fluid ounces of water or unsweet tea for healthy kidneys   Limit to max 1 drink of alcohol per day; avoid smoking/tobacco   Limit animal fats in diet for cholesterol and heart health - choose grass fed whenever available   Avoid highly processed foods, and foods high in saturated/trans fats   Aim for low stress - take time to unwind and care for your mental health   Aim for 150 min of moderate intensity exercise weekly for heart health, and weights twice weekly for bone health   Aim for 7-9 hours of sleep daily   When it comes to diets, agreement about the perfect plan isnt easy to find, even among the experts. Experts at the [employer] developed an idea known as the Healthy Eating Plate. Just imagine a plate divided into logical, healthy portions.   The emphasis is on diet quality:   Load up on vegetables and fruits - one-half of your plate: Aim for color and variety, and remember that potatoes dont count.   Go for whole grains -  one-quarter of your plate: Whole wheat, barley, wheat berries, quinoa, oats, brown rice, and foods made with them. If you want pasta, go with whole wheat pasta.   Protein  power - one-quarter of your plate: Fish, chicken, beans, and nuts are all healthy, versatile protein sources. Limit red meat.   The diet, however, does go beyond the plate, offering a few other suggestions.   Use healthy plant oils, such as olive, canola, soy, corn, sunflower and peanut. Check the labels, and avoid partially hydrogenated oil, which have unhealthy trans fats.   If youre thirsty, drink water. Coffee and tea are good in moderation, but skip sugary drinks and limit milk and dairy products to one or two daily servings.   The type of carbohydrate in the diet is more important than the amount. Some sources of carbohydrates, such as vegetables, fruits, whole grains, and beans-are healthier than others.   Finally, stay active  Signed, Berniece Salines, DO  10/08/2020 9:25 AM    [place]

## 2020-10-08 NOTE — Patient Instructions (Addendum)
Medication Instructions:  Your physician recommends that you continue on your current medications as directed. Please refer to the Current Medication list given to you today.  *If you need a refill on your cardiac medications before your next appointment, please call your pharmacy*   Lab Work: None If you have labs (blood work) drawn today and your tests are completely normal, you will receive your results only by: Marland Kitchen MyChart Message (if you have MyChart) OR . A paper copy in the mail If you have any lab test that is abnormal or we need to change your treatment, we will call you to review the results.   Testing/Procedures: Pulmonary Function Test  Follow-Up: At [facility], you and your health needs are our priority.  As part of our continuing mission to provide you with exceptional heart care, we have created designated Provider Care Teams.  These Care Teams include your primary Cardiologist (physician) and Advanced Practice Providers (APPs -  Physician Assistants and Nurse Practitioners) who all work together to provide you with the care you need, when you need it.  We recommend signing up for the patient portal called "MyChart".  Sign up information is provided on this After Visit Summary.  MyChart is used to connect with patients for Virtual Visits (Telemedicine).  Patients are able to view lab/test results, encounter notes, upcoming appointments, etc.  Non-urgent messages can be sent to your provider as well.   To learn more about what you can do with MyChart, go to [email or web address].    Your next appointment:   1 year(s)  The format for your next appointment:   In Person  Provider:   Berniece Salines, DO   Other Instructions

## 2020-11-05 ENCOUNTER — Telehealth: Payer: Self-pay | Admitting: Internal Medicine

## 2020-11-05 NOTE — Telephone Encounter (Signed)
This medication is not currently on med list  Last rx was ended in 2017  Would need rov if this is felt needed

## 2020-11-05 NOTE — Telephone Encounter (Signed)
gabapentin (NEURONTIN) capsule 600 mg   [facility] [address], [place] to [address] [place] [address] Phone:  [phone]  Fax:  [phone]     Last seen- 11.24.21 Next apt- 05.26.22

## 2020-11-06 NOTE — Telephone Encounter (Signed)
Patient notified via voicemail.

## 2020-12-05 ENCOUNTER — Other Ambulatory Visit: Payer: Self-pay | Admitting: Internal Medicine

## 2020-12-30 ENCOUNTER — Other Ambulatory Visit: Payer: Self-pay

## 2020-12-30 ENCOUNTER — Telehealth: Payer: Self-pay | Admitting: Cardiology

## 2020-12-30 ENCOUNTER — Telehealth: Payer: Self-pay | Admitting: *Deleted

## 2020-12-30 DIAGNOSIS — R0609 Other forms of dyspnea: Secondary | ICD-10-CM

## 2020-12-30 DIAGNOSIS — R0600 Dyspnea, unspecified: Secondary | ICD-10-CM

## 2020-12-30 DIAGNOSIS — R5383 Other fatigue: Secondary | ICD-10-CM

## 2020-12-30 NOTE — Telephone Encounter (Signed)
Accessed pt chart to read dr. Valentina Gu to  pt

## 2020-12-30 NOTE — Telephone Encounter (Signed)
Follow Up;     Patient calling to check the status of his mychart message. Please call patient and . Best number [phone]

## 2020-12-30 NOTE — Progress Notes (Signed)
Appointments made for patient.

## 2020-12-30 NOTE — Telephone Encounter (Signed)
Patient's wife left a VM stating Mr. Nardone has become increasingly SOB with activity and is having difficulty swallowing. 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 Called patient to go over symptoms. Per patient, he reports feeling lightheaded at times with SOB when performing activities of daily living. Asked patient if he has been taking his blood pressure in which he states he hasn't in the past few days. Patient reports in the past they ran 120's/60-70's. Patient retrieved BP cuff from box and took BP while on the telephone, 152/97. Patient reports back pain. Advised patient to go to the ED if he is experiencing back pain. Per patient the pain is not excruciating but is more chronic in nature as it begins to subside as he gets going in the morning. Advised patient to contact his cardiologist. To further discuss. Patient is to f/u with CT of chest with Dr. Orvan Seen in June. Patient verbalizes understanding.

## 2021-01-02 ENCOUNTER — Encounter: Payer: Self-pay | Admitting: Internal Medicine

## 2021-01-02 ENCOUNTER — Other Ambulatory Visit: Payer: Self-pay

## 2021-01-02 ENCOUNTER — Ambulatory Visit (INDEPENDENT_AMBULATORY_CARE_PROVIDER_SITE_OTHER): Payer: Medicare Other | Admitting: Internal Medicine

## 2021-01-02 ENCOUNTER — Ambulatory Visit (INDEPENDENT_AMBULATORY_CARE_PROVIDER_SITE_OTHER): Payer: Medicare Other

## 2021-01-02 VITALS — BP 112/60 | HR 58 | Temp 97.5°F | Ht 70.0 in | Wt 180.4 lb

## 2021-01-02 DIAGNOSIS — E538 Deficiency of other specified B group vitamins: Secondary | ICD-10-CM

## 2021-01-02 DIAGNOSIS — R739 Hyperglycemia, unspecified: Secondary | ICD-10-CM

## 2021-01-02 DIAGNOSIS — R1310 Dysphagia, unspecified: Secondary | ICD-10-CM

## 2021-01-02 DIAGNOSIS — R0609 Other forms of dyspnea: Secondary | ICD-10-CM

## 2021-01-02 DIAGNOSIS — E559 Vitamin D deficiency, unspecified: Secondary | ICD-10-CM

## 2021-01-02 DIAGNOSIS — R918 Other nonspecific abnormal finding of lung field: Secondary | ICD-10-CM

## 2021-01-02 DIAGNOSIS — N320 Bladder-neck obstruction: Secondary | ICD-10-CM

## 2021-01-02 DIAGNOSIS — R0600 Dyspnea, unspecified: Secondary | ICD-10-CM

## 2021-01-02 DIAGNOSIS — I712 Thoracic aortic aneurysm, without rupture: Secondary | ICD-10-CM | POA: Diagnosis not present

## 2021-01-02 DIAGNOSIS — I10 Essential (primary) hypertension: Secondary | ICD-10-CM | POA: Diagnosis not present

## 2021-01-02 DIAGNOSIS — D649 Anemia, unspecified: Secondary | ICD-10-CM | POA: Diagnosis not present

## 2021-01-02 DIAGNOSIS — I7121 Aneurysm of the ascending aorta, without rupture: Secondary | ICD-10-CM

## 2021-01-02 HISTORY — DX: Other nonspecific abnormal finding of lung field: R91.8

## 2021-01-02 HISTORY — DX: Dysphagia, unspecified: R13.10

## 2021-01-02 LAB — URINALYSIS, ROUTINE W REFLEX MICROSCOPIC
Bilirubin Urine: NEGATIVE
Hgb urine dipstick: NEGATIVE
Ketones, ur: NEGATIVE
Leukocytes,Ua: NEGATIVE
Nitrite: NEGATIVE
RBC / HPF: NONE SEEN
Specific Gravity, Urine: 1.025 (ref 1.000–1.030)
Total Protein, Urine: NEGATIVE
Urine Glucose: NEGATIVE
Urobilinogen, UA: 0.2 (ref 0.0–1.0)
pH: 6 (ref 5.0–8.0)

## 2021-01-02 LAB — LIPID PANEL
Cholesterol: 170 mg/dL (ref 0–200)
HDL: 52.2 mg/dL
LDL Cholesterol: 83 mg/dL (ref 0–99)
NonHDL: 117.36
Total CHOL/HDL Ratio: 3
Triglycerides: 170 mg/dL — ABNORMAL HIGH (ref 0.0–149.0)
VLDL: 34 mg/dL (ref 0.0–40.0)

## 2021-01-02 LAB — VITAMIN D 25 HYDROXY (VIT D DEFICIENCY, FRACTURES): VITD: 43.38 ng/mL (ref 30.00–100.00)

## 2021-01-02 LAB — CBC WITH DIFFERENTIAL/PLATELET
Basophils Absolute: 0.1 10*3/uL (ref 0.0–0.1)
Basophils Relative: 1 % (ref 0.0–3.0)
Eosinophils Absolute: 0.3 10*3/uL (ref 0.0–0.7)
Eosinophils Relative: 3.8 % (ref 0.0–5.0)
HCT: 38.9 % — ABNORMAL LOW (ref 39.0–52.0)
Hemoglobin: 13.5 g/dL (ref 13.0–17.0)
Lymphocytes Relative: 23.3 % (ref 12.0–46.0)
Lymphs Abs: 1.7 10*3/uL (ref 0.7–4.0)
MCHC: 34.6 g/dL (ref 30.0–36.0)
MCV: 94.1 fl (ref 78.0–100.0)
Monocytes Absolute: 1.3 10*3/uL — ABNORMAL HIGH (ref 0.1–1.0)
Monocytes Relative: 17.2 % — ABNORMAL HIGH (ref 3.0–12.0)
Neutro Abs: 4 10*3/uL (ref 1.4–7.7)
Neutrophils Relative %: 54.7 % (ref 43.0–77.0)
Platelets: 253 10*3/uL (ref 150.0–400.0)
RBC: 4.13 Mil/uL — ABNORMAL LOW (ref 4.22–5.81)
RDW: 12.9 % (ref 11.5–15.5)
WBC: 7.3 10*3/uL (ref 4.0–10.5)

## 2021-01-02 LAB — BASIC METABOLIC PANEL
BUN: 25 mg/dL — ABNORMAL HIGH (ref 6–23)
CO2: 33 mEq/L — ABNORMAL HIGH (ref 19–32)
Calcium: 9.7 mg/dL (ref 8.4–10.5)
Chloride: 97 mEq/L (ref 96–112)
Creatinine, Ser: 1.46 mg/dL (ref 0.40–1.50)
GFR: 50.62 mL/min — ABNORMAL LOW (ref >60.00)
Glucose, Bld: 95 mg/dL (ref 70–99)
Potassium: 3.9 mEq/L (ref 3.5–5.1)
Sodium: 139 mEq/L (ref 135–145)

## 2021-01-02 LAB — VITAMIN B12: Vitamin B-12: 338 pg/mL (ref 211–911)

## 2021-01-02 LAB — HEPATIC FUNCTION PANEL
ALT: 14 U/L (ref 0–53)
AST: 21 U/L (ref 0–37)
Albumin: 4.5 g/dL (ref 3.5–5.2)
Alkaline Phosphatase: 58 U/L (ref 39–117)
Bilirubin, Direct: 0.2 mg/dL (ref 0.0–0.3)
Total Bilirubin: 1.2 mg/dL (ref 0.2–1.2)
Total Protein: 6.5 g/dL (ref 6.0–8.3)

## 2021-01-02 LAB — IBC PANEL
Iron: 169 ug/dL — ABNORMAL HIGH (ref 42–165)
Saturation Ratios: 39.7 % (ref 20.0–50.0)
Transferrin: 304 mg/dL (ref 212.0–360.0)

## 2021-01-02 LAB — HEMOGLOBIN A1C: Hgb A1c MFr Bld: 5.9 % (ref 4.6–6.5)

## 2021-01-02 LAB — TSH: TSH: 1.32 u[IU]/mL (ref 0.35–4.50)

## 2021-01-02 LAB — PSA: PSA: 0.55 ng/mL (ref 0.10–4.00)

## 2021-01-02 LAB — FERRITIN: Ferritin: 45.9 ng/mL (ref 22.0–322.0)

## 2021-01-02 MED ORDER — TADALAFIL 20 MG PO TABS: 10.0000 mg | ORAL_TABLET | ORAL | 11 refills | Status: DC | PRN

## 2021-01-02 NOTE — Progress Notes (Addendum)
Patient ID: Luis Wilkinson, male   DOB: 1956/03/20, 65 y.o.   MRN: [id number]        Chief Complaint: DOE       HPI:  Luis Wilkinson is a 65 y.o. male here with c/o being S/p covid infction dec 2021 illfor 3 wks, no pseicif tx, but still lost taste and smell for the most part since then.  Also some sob/doe unusual for him as well; wife said he turne blue-gray color, palps and dizzy with the sob/doe.   Has f/u appt with CV surgury in June 2022 CT for aortic aneurysm.  He realizes some sob/doe may be deconditioning and age are factors but these symptoms just too unsual and significant.He feels imited even mowing the grass - riding mower ok, but even 50 yds on small honda trim mower (no power assit) is too much.  CTA chest for purpose of aortic anuerysm f/u with aortia 4.4 cm, but also noted small area ground glass.   Also has Echo scheduled for June 15.  Pt denies chest pain, orthopnea, PND, increased LE swelling, or syncope.  Does have mild worsening ED symptoms. Also with mild dysphagia to solids for several weeks, but Denies worsening reflux, abd pain, n/v, bowel change or blood.         Wt Readings from Last 3 Encounters:  01/02/21 180 lb 6.4 oz (81.8 kg)  10/08/20 187 lb 9.6 oz (85.1 kg)  08/06/20 172 lb (78 kg)   BP Readings from Last 3 Encounters:  01/02/21 112/60  10/08/20 110/72  08/12/20 128/84         Past Medical History:  Diagnosis Date  . Aneurysm, ascending aorta (HCC)    followed br Dr Hortencia Pilar  . History of kidney stones   . History of partial colectomy 12/18/2019  . Hypertension   . RA (rheumatoid arthritis) ([place])   . 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 Seronegative rheumatoid arthritis ([place]) 12/18/2019   Past Surgical History:  Procedure Laterality Date  . CARDIAC CATHETERIZATION  Feb 2015    [place]   per pt normal coronary arteries  . CATARACT EXTRACTION W/ INTRAOCULAR LENS IMPLANT Right Mar 2014  . CYSTOSCOPY W/  URETEROSCOPY W/ LITHOTRIPSY    . EXCISION SUBDERMAL NECK TUMOR  2010   benign  . KNEE ARTHROSCOPY W/ MENISCECTOMY Right 01-09-2015  . LEFT ELBOW RECONSTRUCTION  2010  . PARTIAL KNEE ARTHROPLASTY Right 08/08/2015   Procedure: RIGHT UNICOMPARTMENTAL KNEE REVISION PLASTIC;  Surgeon: Paralee Cancel, MD;  Location: [facility];  Service: Orthopedics;  Laterality: Right;  . PARTIAL KNEE ARTHROPLASTY Right 03/02/2016   Procedure: UNICOMPARTMENTAL RIGHT KNEE REVISION;  Surgeon: Paralee Cancel, MD;  Location: [facility];  Service: Orthopedics;  Laterality: Right;  . REPLACEMENT UNICONDYLAR JOINT KNEE Right 06-20-2015  . STRABISMUS SURGERY Left x6   last one --Age 64    reports that he quit smoking about 26 years ago. His smoking use included cigarettes. He has a 5.00 pack-year smoking history. He has never used smokeless tobacco. He reports that he does not drink alcohol and does not use drugs. family history includes Congestive Heart Failure in his father and mother. Allergies  Allergen Reactions  . Dilaudid [Hydromorphone] Shortness Of Breath and Other (See Comments)    Chest tight  . Hydroxychloroquine Sulfate Swelling and Itching  . Morphine Shortness Of Breath    SEVERE  . Hydroxychloroquine Rash  . Infliximab Rash  SEVERE   Current Outpatient Medications on File Prior to Visit  Medication Sig Dispense Refill  . atenolol (TENORMIN) 100 MG tablet One in am and a half in pm 135 tablet 3  . atorvastatin (LIPITOR) 10 MG tablet Take 1 tablet (10 mg total) by mouth daily. 90 tablet 3  . Certolizumab Pegol (CIMZIA STARTER KIT [place]) Inject into the skin.    . diazepam (VALIUM) 5 MG tablet 1 tab by mouth 60 min prior to procedure 1 tablet 0  . DULoxetine (CYMBALTA) 60 MG capsule Take 1 capsule (60 mg total) by mouth daily. 90 capsule 3  . Glucosamine-Chondroit-Vit C-Mn (GLUCOSAMINE 1500 COMPLEX PO) Take 2 capsules by mouth daily.    Marland Kitchen leflunomide (ARAVA) 20 MG tablet Take 20 mg by mouth  daily.    Marland Kitchen loratadine-pseudoephedrine (CLARITIN-D 24-HOUR) 10-240 MG 24 hr tablet Take 1 tablet by mouth daily as needed for allergies.    . Multiple Vitamin (MULTIVITAMIN) tablet Take 1 tablet by mouth every morning.    . mupirocin nasal ointment (BACTROBAN) 2 % Place 1 application into the nose 2 (two) times daily. Use one-half of tube in each nostril twice daily for five (5) days. After application, press sides of nose together and gently massage. 10 g 2  . mupirocin ointment (BACTROBAN) 2 %     . pantoprazole (PROTONIX) 40 MG tablet Take 30- 60 min before your first and last meals of the day 180 tablet 3  . telmisartan-hydrochlorothiazide (MICARDIS HCT) 80-25 MG tablet Take 1 tablet by mouth daily. 90 tablet 3  . temazepam (RESTORIL) 15 MG capsule TAKE 1 CAPSULE AT BEDTIME  AS NEEDED FOR SLEEP 90 capsule 1  . Turmeric 500 MG CAPS Take by mouth.     No current facility-administered medications on file prior to visit.        ROS:  All others reviewed and negative.  Objective        PE:  BP 112/60 (BP Location: Right Arm, Patient Position: Sitting, Cuff Size: Large)   Pulse (!) 58   Temp (!) 97.5 F (36.4 C) (Oral)   Ht 5' 10"  (1.778 m)   Wt 180 lb 6.4 oz (81.8 kg)   SpO2 97%   BMI 25.88 kg/m      Ambulatory sat on RA - lowest 89%                Constitutional: Pt appears in NAD               HENT: Head: NCAT.                Right Ear: External ear normal.                 Left Ear: External ear normal.                Eyes: . Pupils are equal, round, and reactive to light. Conjunctivae and EOM are normal               Nose: without d/c or deformity               Neck: Neck supple. Gross normal ROM               Cardiovascular: Normal rate and regular rhythm.                 Pulmonary/Chest: Effort normal and breath sounds without rales or wheezing.  Abd:  Soft, NT, ND, + BS, no organomegaly               Neurological: Pt is alert. At baseline orientation, motor  grossly intact               Skin: Skin is warm. No rashes, no other new lesions, LE edema - none               Psychiatric: Pt behavior is normal without agitation but has ongoing mild anxiety  Micro: none  Cardiac tracings I have personally interpreted today:  ECG - sinus bradycardia 52  Pertinent Radiological findings (summarize): none   Lab Results  Component Value Date   WBC 7.3 01/02/2021   HGB 13.5 01/02/2021   HCT 38.9 (L) 01/02/2021   PLT 253.0 01/02/2021   GLUCOSE 95 01/02/2021   CHOL 170 01/02/2021   TRIG 170.0 (H) 01/02/2021   HDL 52.20 01/02/2021   LDLDIRECT 71.0 12/18/2019   LDLCALC 83 01/02/2021   ALT 14 01/02/2021   AST 21 01/02/2021   NA 139 01/02/2021   K 3.9 01/02/2021   CL 97 01/02/2021   CREATININE 1.46 01/02/2021   BUN 25 (H) 01/02/2021   CO2 33 (H) 01/02/2021   TSH 1.32 01/02/2021   PSA 0.55 01/02/2021   HGBA1C 5.9 01/02/2021   Assessment/Plan:  Luis Wilkinson is a 65 y.o. White or Caucasian [1] male with  has a past medical history of Aneurysm, ascending aorta ([place]), History of kidney stones, History of partial colectomy (12/18/2019), Hypertension, RA (rheumatoid arthritis) ([place]), S/P right unicompartmental knee replacement, and Seronegative rheumatoid arthritis ([place]) (12/18/2019).  DOE (dyspnea on exertion) ECG reviewed, Overall mild worsening over past 2 yrs it seems but worse recently, and sat 89% ambulatory today; will order CTA chest to f/u aortic aneursym but also ? Ground glass and ? Micronodular lung dz, has echo ordered, for cxr toda, labs as ordered, PFTs and refer pulmonary  Ground glass opacity present on imaging of lung For CTA chest as above  Dysphagia Etiology unclear, for GI referral  Essential (primary) hypertension BP Readings from Last 3 Encounters:  01/02/21 112/60  10/08/20 110/72  08/12/20 128/84   Stable, pt to continue medical treatment tenormin, micardis hct   Ascending aortic aneurysm (HCC) For f//u imaging as  above, and CV surgury  Anemia For cbc with labs, no recent overt bleeding  Hyperglycemia Lab Results  Component Value Date   HGBA1C 5.9 01/02/2021   Stable, pt to continue current medical treatment  - diet   Followup: Return in about 4 weeks (around 01/30/2021).  Cathlean Cower, MD 01/05/2021 4:16 PM [facility]'

## 2021-01-02 NOTE — Patient Instructions (Addendum)
Your EKG was [facility] [place] today  Your Ambulatory O2 sat was OK today (the oxgen level with walking)  Please take all new medication as prescribed - the cialis as needed  Please continue all other medications as before  Please have the pharmacy call with any other refills you may need.  Please continue your efforts at being more active, low cholesterol diet, and weight control.  Please keep your appointments with your specialists as you may have planned - Cardiology next month as you already have scheduled (near [place]) - for the Echo, and consider cardiac stress testing  You will be contacted regarding the referral for: Gastroenterology,  CTA chest, Lung testing (called PFT's), and Pulmonary (which may take more than 1 month) to r/o a lung process such as "rheumatoid lung" or post covid disorder or malignancy  Please go to the XRAY Department in the first floor for the x-ray testing  Please go to the LAB at the blood drawing area for the tests to be done  You will be contacted by phone if any changes need to be made immediately.  Otherwise, you will receive a letter about your results with an explanation, but please check with MyChart first.  Please remember to sign up for MyChart if you have not done so, as this will be important to you in the future with finding out test results, communicating by private email, and scheduling acute appointments online when needed.  Please make an Appointment to return in 1 months

## 2021-01-03 ENCOUNTER — Encounter: Payer: Self-pay | Admitting: Internal Medicine

## 2021-01-05 ENCOUNTER — Encounter: Payer: Self-pay | Admitting: Internal Medicine

## 2021-01-05 NOTE — Assessment & Plan Note (Signed)
ECG reviewed, Overall mild worsening over past 2 yrs it seems but worse recently, and sat 89% ambulatory today; will order CTA chest to f/u aortic aneursym but also ? Ground glass and ? Micronodular lung dz, has echo ordered, for cxr toda, labs as ordered, PFTs and refer pulmonary

## 2021-01-05 NOTE — Assessment & Plan Note (Signed)
For cbc with labs, no recent overt bleeding

## 2021-01-05 NOTE — Assessment & Plan Note (Signed)
For CTA chest as above

## 2021-01-05 NOTE — Assessment & Plan Note (Signed)
Lab Results  Component Value Date   HGBA1C 5.9 01/02/2021   Stable, pt to continue current medical treatment  - diet

## 2021-01-05 NOTE — Assessment & Plan Note (Signed)
Etiology unclear, for GI referral 

## 2021-01-05 NOTE — Assessment & Plan Note (Signed)
BP Readings from Last 3 Encounters:  01/02/21 112/60  10/08/20 110/72  08/12/20 128/84   Stable, pt to continue medical treatment tenormin, micardis hct

## 2021-01-05 NOTE — Assessment & Plan Note (Signed)
For f//u imaging as above, and CV surgury

## 2021-01-10 ENCOUNTER — Other Ambulatory Visit: Payer: Self-pay

## 2021-01-10 DIAGNOSIS — R0609 Other forms of dyspnea: Secondary | ICD-10-CM

## 2021-01-10 DIAGNOSIS — R0600 Dyspnea, unspecified: Secondary | ICD-10-CM

## 2021-01-10 DIAGNOSIS — R5383 Other fatigue: Secondary | ICD-10-CM

## 2021-01-13 ENCOUNTER — Telehealth: Payer: Self-pay | Admitting: Internal Medicine

## 2021-01-13 MED ORDER — DIAZEPAM 5 MG PO TABS: ORAL_TABLET | ORAL | 0 refills | Status: DC

## 2021-01-13 NOTE — Telephone Encounter (Signed)
PMP done; last filled 07/09/20

## 2021-01-13 NOTE — Telephone Encounter (Signed)
Patient is having a CT done on 01-16-21. Please advise   1.Medication Requested: diazepam (VALIUM) 5 MG tablet    2. Pharmacy (Name, Street, [place]): [place], [place] - [address]  3. On Med List: yes   4. Last Visit with PCP: 01-02-21  5. Next visit date with PCP: 02-03-21   Agent: Please be advised that RX refills may take up to 3 business days. We ask that you follow-up with your pharmacy.

## 2021-01-15 ENCOUNTER — Other Ambulatory Visit: Payer: Self-pay | Admitting: *Deleted

## 2021-01-15 DIAGNOSIS — I712 Thoracic aortic aneurysm, without rupture, unspecified: Secondary | ICD-10-CM

## 2021-01-16 ENCOUNTER — Ambulatory Visit
Admission: RE | Admit: 2021-01-16 | Discharge: 2021-01-16 | Disposition: A | Discharge status: Home / Self Care | Payer: Medicare Other | Source: Ambulatory Visit | Attending: Internal Medicine | Admitting: Internal Medicine

## 2021-01-16 ENCOUNTER — Encounter: Payer: Self-pay | Admitting: Internal Medicine

## 2021-01-16 DIAGNOSIS — R918 Other nonspecific abnormal finding of lung field: Secondary | ICD-10-CM

## 2021-01-16 DIAGNOSIS — I7121 Aneurysm of the ascending aorta, without rupture: Secondary | ICD-10-CM

## 2021-01-16 MED ORDER — IOPAMIDOL (ISOVUE-370) INJECTION 76%
75.0000 mL | Freq: Once | INTRAVENOUS | Status: AC | PRN
  Administered 2021-01-16: 75 mL via INTRAVENOUS

## 2021-01-17 ENCOUNTER — Encounter: Payer: Self-pay | Admitting: Internal Medicine

## 2021-01-21 ENCOUNTER — Telehealth: Payer: Self-pay | Admitting: Cardiology

## 2021-01-21 ENCOUNTER — Other Ambulatory Visit: Payer: Self-pay

## 2021-01-21 DIAGNOSIS — R5383 Other fatigue: Secondary | ICD-10-CM

## 2021-01-21 DIAGNOSIS — R0609 Other forms of dyspnea: Secondary | ICD-10-CM

## 2021-01-21 NOTE — Telephone Encounter (Signed)
Pt c/o Shortness Of Breath: STAT if SOB developed within the last 24 hours or pt is noticeably SOB on the phone  1. Are you currently SOB (can you hear that pt is SOB on the phone)? Yes   2. How long have you been experiencing SOB? Worse this weekend   3. Are you SOB when sitting or when up moving around? When he gets up  4. Are you currently experiencing any other symptoms? Sweating feeling like he I going to pass out.

## 2021-01-21 NOTE — Telephone Encounter (Signed)
Spoke to the patient just now and he let me know that he is having tightness/discomfort in his chest. He states that this started about a month or two ago and has not gone away.   He is SOB with any kind of exertion. He states that with any kind of exertion he feels very sweaty and feels like he is going to pass out.   He took his blood pressure yesterday and it was 135/89, heart rate 85-90 bpm.   He is scheduled for an echo tomorrow and to see Dr. Harriet Masson on Friday. I will route to her to see if she has any recommendations for the patient in the mean time.

## 2021-01-22 ENCOUNTER — Ambulatory Visit (INDEPENDENT_AMBULATORY_CARE_PROVIDER_SITE_OTHER): Payer: Medicare Other

## 2021-01-22 ENCOUNTER — Other Ambulatory Visit: Payer: Self-pay

## 2021-01-22 DIAGNOSIS — R5383 Other fatigue: Secondary | ICD-10-CM | POA: Diagnosis not present

## 2021-01-22 DIAGNOSIS — R0600 Dyspnea, unspecified: Secondary | ICD-10-CM

## 2021-01-22 DIAGNOSIS — R0609 Other forms of dyspnea: Secondary | ICD-10-CM

## 2021-01-22 LAB — ECHOCARDIOGRAM COMPLETE
Area-P 1/2: 3.48 cm2
S' Lateral: 3 cm

## 2021-01-22 NOTE — Progress Notes (Signed)
Complete echocardiogram performed.  Jimmy Katena Petitjean RDCS, RVT  

## 2021-01-24 ENCOUNTER — Other Ambulatory Visit: Payer: Self-pay

## 2021-01-24 ENCOUNTER — Ambulatory Visit (INDEPENDENT_AMBULATORY_CARE_PROVIDER_SITE_OTHER): Payer: Medicare Other

## 2021-01-24 ENCOUNTER — Encounter: Payer: Self-pay | Admitting: Cardiology

## 2021-01-24 ENCOUNTER — Ambulatory Visit (INDEPENDENT_AMBULATORY_CARE_PROVIDER_SITE_OTHER): Payer: Medicare Other | Admitting: Cardiology

## 2021-01-24 VITALS — BP 112/66 | HR 71 | Ht 70.0 in | Wt 182.0 lb

## 2021-01-24 DIAGNOSIS — I10 Essential (primary) hypertension: Secondary | ICD-10-CM | POA: Diagnosis not present

## 2021-01-24 DIAGNOSIS — R079 Chest pain, unspecified: Secondary | ICD-10-CM | POA: Insufficient documentation

## 2021-01-24 DIAGNOSIS — R002 Palpitations: Secondary | ICD-10-CM | POA: Diagnosis not present

## 2021-01-24 DIAGNOSIS — R0600 Dyspnea, unspecified: Secondary | ICD-10-CM

## 2021-01-24 DIAGNOSIS — I712 Thoracic aortic aneurysm, without rupture, unspecified: Secondary | ICD-10-CM

## 2021-01-24 DIAGNOSIS — R0609 Other forms of dyspnea: Secondary | ICD-10-CM

## 2021-01-24 HISTORY — DX: Chest pain, unspecified: R07.9

## 2021-01-24 MED ORDER — ASPIRIN EC 81 MG PO TBEC: 81.0000 mg | DELAYED_RELEASE_TABLET | Freq: Every day | ORAL | 3 refills | Status: DC

## 2021-01-24 NOTE — H&P (View-Only) (Signed)
Cardiology Office Note:    Date:  01/24/2021   ID:  Marcellis Helling, DOB 04/14/1956, MRN [id number]  PCP:  John, James W, MD  Cardiologist:  Eevee Borbon, DO  Electrophysiologist:  None   Referring MD: John, James W, MD   No chief complaint on file. " I have been experiencing worsening shortness of breath and chest pain"  History of Present Illness:    Brian Jagger is a 65 y.o. male with a hx of rheumatoid arthritis, current smoker, heart catheterization in 2015 with normal coronary arteries, ascending thoracic aortic aneurysm last reported to be 4.4cm in December 2021, and hypertension.   I did see the patient back in December for a operative clearance for hand surgery.  We will repeat an echocardiogram and a CTA given his history of ascending thoracic aneurysm.  I also referred the patient to vascular surgery.  He was intermittently cleared for his hand surgery.  He was able to get his hand surgery.  He also has seen vascular surgeon.  I saw the patient in March 2022 at that time he appeared to be doing well from a cardiovascular standpoint no changes were made to his medication regimen.  He did have some shortness of breath for recommended patient get a PFT to make sure secondary lung pathology was not playing a role as his echocardiogram was normal.   In the interim the patient was unable to get his PFT but also contracted COVID-19 infection.  He had recovered well.  He tells me the last month and a half he has had dramatic change of his wellbeing.  He has been experiencing significant shortness of breath on exertion.  He is unable to do basic things like he used to he tells me he was washing his car which is unlike thing that he does but got so short of breath that he could not continue this.  He also is unable to even mow his lawn.  His wife who is here tells me that he had been very active but she is concerned giving his limitations for all of the activities. In addition to days he has  had some chest discomfort.  He described as a midsternal tightness which comes and goes.  It usually is associated with the shortness of breath at times.  And is usually is on exertion.  He does admit to significant sweating and fatigue when this is going on.  The palpitation also is new and is really a big problem for him as well.  He has not passed out he has not had any lightheadedness or dizziness.  Past Medical History:  Diagnosis Date   Aneurysm, ascending aorta (HCC)    followed br Dr Jenyung   History of kidney stones    History of partial colectomy 12/18/2019   Hypertension    RA (rheumatoid arthritis) (HCC)    S/P right unicompartmental knee replacement    06-20-2015  post dislocating plastic    Seronegative rheumatoid arthritis (HCC) 12/18/2019    Past Surgical History:  Procedure Laterality Date   CARDIAC CATHETERIZATION  Feb 2015    [place]   per pt normal coronary arteries   CATARACT EXTRACTION W/ INTRAOCULAR LENS IMPLANT Right Mar 2014   CYSTOSCOPY W/ URETEROSCOPY W/ LITHOTRIPSY     EXCISION SUBDERMAL NECK TUMOR  2010   benign   KNEE ARTHROSCOPY W/ MENISCECTOMY Right 01-09-2015   LEFT ELBOW RECONSTRUCTION  2010   PARTIAL KNEE ARTHROPLASTY Right 08/08/2015   Procedure: RIGHT   UNICOMPARTMENTAL KNEE REVISION PLASTIC;  Surgeon: Paralee Cancel, MD;  Location: [facility];  Service: Orthopedics;  Laterality: Right;   PARTIAL KNEE ARTHROPLASTY Right 03/02/2016   Procedure: UNICOMPARTMENTAL RIGHT KNEE REVISION;  Surgeon: Paralee Cancel, MD;  Location: [facility];  Service: Orthopedics;  Laterality: Right;   REPLACEMENT UNICONDYLAR JOINT KNEE Right 06-20-2015   STRABISMUS SURGERY Left x6   last one --Age 60    Current Medications: Current Meds  Medication Sig   aspirin EC 81 MG tablet Take 1 tablet (81 mg total) by mouth daily. Swallow whole.   atenolol (TENORMIN) 100 MG tablet One in am and a half in pm   atorvastatin (LIPITOR) 10 MG tablet Take 1 tablet (10 mg  total) by mouth daily.   Certolizumab Pegol (CIMZIA STARTER KIT [place]) Inject into the skin.   diazepam (VALIUM) 5 MG tablet 1 tab by mouth 60 min prior to procedure   DULoxetine (CYMBALTA) 60 MG capsule Take 1 capsule (60 mg total) by mouth daily.   Glucosamine-Chondroit-Vit C-Mn (GLUCOSAMINE 1500 COMPLEX PO) Take 2 capsules by mouth daily.   leflunomide (ARAVA) 20 MG tablet Take 20 mg by mouth daily.   loratadine-pseudoephedrine (CLARITIN-D 24-HOUR) 10-240 MG 24 hr tablet Take 1 tablet by mouth daily as needed for allergies.   Multiple Vitamin (MULTIVITAMIN) tablet Take 1 tablet by mouth every morning.   mupirocin nasal ointment (BACTROBAN) 2 % Place 1 application into the nose 2 (two) times daily. Use one-half of tube in each nostril twice daily for five (5) days. After application, press sides of nose together and gently massage.   mupirocin ointment (BACTROBAN) 2 %    pantoprazole (PROTONIX) 40 MG tablet Take 30- 60 min before your first and last meals of the day   tadalafil (CIALIS) 20 MG tablet Take 0.5-1 tablets (10-20 mg total) by mouth every other day as needed for erectile dysfunction.   telmisartan-hydrochlorothiazide (MICARDIS HCT) 80-25 MG tablet Take 1 tablet by mouth daily.   temazepam (RESTORIL) 15 MG capsule TAKE 1 CAPSULE AT BEDTIME  AS NEEDED FOR SLEEP     Allergies:   Dilaudid [hydromorphone], Hydroxychloroquine sulfate, Morphine, Hydroxychloroquine, and Infliximab   Social History   Socioeconomic History   Marital status: Married    Spouse name: Not on file   Number of children: Not on file   Years of education: Not on file   Highest education level: Not on file  Occupational History   Not on file  Tobacco Use   Smoking status: Former    Packs/day: 0.50    Years: 10.00    Pack years: 5.00    Types: Cigarettes    Quit date: 07/19/1994    Years since quitting: 26.5   Smokeless tobacco: Never  Substance and Sexual Activity   Alcohol use: No   Drug use: No    Sexual activity: Not on file  Other Topics Concern   Not on file  Social History Narrative   Not on file   Social Determinants of Health   Financial Resource Strain: Not on file  Food Insecurity: Not on file  Transportation Needs: Not on file  Physical Activity: Not on file  Stress: Not on file  Social Connections: Not on file     Family History: The patient's family history includes Congestive Heart Failure in his father and mother.  ROS:   Review of Systems  Constitution: Negative for decreased appetite, fever and weight gain.  HENT: Negative for congestion, ear discharge, hoarse  voice and sore throat.   Eyes: Negative for discharge, redness, vision loss in right eye and visual halos.  Cardiovascular: Negative for chest pain, dyspnea on exertion, leg swelling, orthopnea and palpitations.  Respiratory: Negative for cough, hemoptysis, shortness of breath and snoring.   Endocrine: Negative for heat intolerance and polyphagia.  Hematologic/Lymphatic: Negative for bleeding problem. Does not bruise/bleed easily.  Skin: Negative for flushing, nail changes, rash and suspicious lesions.  Musculoskeletal: Negative for arthritis, joint pain, muscle cramps, myalgias, neck pain and stiffness.  Gastrointestinal: Negative for abdominal pain, bowel incontinence, diarrhea and excessive appetite.  Genitourinary: Negative for decreased libido, genital sores and incomplete emptying.  Neurological: Negative for brief paralysis, focal weakness, headaches and loss of balance.  Psychiatric/Behavioral: Negative for altered mental status, depression and suicidal ideas.  Allergic/Immunologic: Negative for HIV exposure and persistent infections.    EKGs/Labs/Other Studies Reviewed:    The following studies were reviewed today:   EKG: None today TTE 01/23/2020 IMPRESSIONS     1. Left ventricular ejection fraction, by estimation, is 60 to 65%. The  left ventricle has normal function. The left  ventricle has no regional  wall motion abnormalities. Left ventricular diastolic parameters are  indeterminate.   2. Right ventricular systolic function is normal. The right ventricular  size is normal. There is normal pulmonary artery systolic pressure.   3. The mitral valve is normal in structure. No evidence of mitral valve  regurgitation. No evidence of mitral stenosis.   4. The aortic valve is normal in structure. Aortic valve regurgitation is  mild. No aortic stenosis is present.   5. Aneurysm of the ascending aorta, measuring 44 mm.   6. The inferior vena cava is normal in size with greater than 50%  respiratory variability, suggesting right atrial pressure of 3 mmHg.   FINDINGS   Left Ventricle: Left ventricular ejection fraction, by estimation, is 60  to 65%. The left ventricle has normal function. The left ventricle has no  regional wall motion abnormalities. The left ventricular internal cavity  size was normal in size. There is   borderline left ventricular hypertrophy. Left ventricular diastolic  parameters are indeterminate.   Right Ventricle: The right ventricular size is normal. No increase in  right ventricular wall thickness. Right ventricular systolic function is  normal. There is normal pulmonary artery systolic pressure. The tricuspid  regurgitant velocity is 2.02 m/s, and   with an assumed right atrial pressure of 3 mmHg, the estimated right  ventricular systolic pressure is 25.3 mmHg.   Left Atrium: Left atrial size was normal in size.   Right Atrium: Right atrial size was normal in size.   Pericardium: There is no evidence of pericardial effusion.   Mitral Valve: The mitral valve is normal in structure. No evidence of  mitral valve regurgitation. No evidence of mitral valve stenosis.   Tricuspid Valve: The tricuspid valve is normal in structure. Tricuspid  valve regurgitation is not demonstrated. No evidence of tricuspid  stenosis.   Aortic Valve: The  aortic valve is normal in structure. Aortic valve  regurgitation is mild. No aortic stenosis is present.   Pulmonic Valve: The pulmonic valve was normal in structure. Pulmonic valve  regurgitation is not visualized. No evidence of pulmonic stenosis.   Aorta: The aortic root is normal in size and structure. There is an  aneurysm involving the ascending aorta measuring 44 mm.   Venous: The inferior vena cava is normal in size with greater than 50%  respiratory variability, suggesting  right atrial pressure of 3 mmHg.   IAS/Shunts: No atrial level shunt detected by color flow Doppler.      CT of the chest June 9, 2021FINDINGS: Cardiovascular: Preferential opacification of the thoracic aorta. No evidence of thoracic aortic aneurysm or dissection. Normal heart size. No pericardial effusion.   Sinues of Valsalva: 29 mm 34 x 31 mm ,unchanged   Sinotubular Junction: 37 mm ,unchanged   Ascending Aorta: 43 mm ,unchanged   Aortic Arch: 35 mm ,unchanged   Descending aorta: 26 mm at the level of the carina ,unchanged   Branch vessels: Conventional branching pattern. No significant atherosclerotic changes.   Coronary arteries: Normal origins and courses. No significant atherosclerotic calcifications.   Main pulmonary artery: 26 mm ,unchanged. No evidence of central pulmonary embolism.   Pulmonary veins: No anomalous pulmonary venous return. No evidence of left atrial appendage thrombus.   Upper abdominal vasculature: Within normal limits.   Mediastinum/Nodes: No enlarged mediastinal, hilar, or axillary lymph nodes. Thyroid gland, trachea, and esophagus demonstrate no significant findings.   Lungs/Pleura: Unchanged ground-glass pulmonary nodule in the anterior lingula measuring up to 0.7 cm. No new suspicious pulmonary nodules. No focal consolidations. No evidence of pleural effusion or pneumothorax.   Upper Abdomen: The visualized upper abdomen is within normal limits.    Musculoskeletal: No acute osseous abnormality. Multilevel degenerative changes of the thoracic spine.   Review of the MIP images confirms the above findings.   IMPRESSION: Vascular:   Similar appearing fusiform ascending thoracic aortic aneurysm measuring up to 43 mm. Recommend annual imaging followup by CTA or MRA. This recommendation follows 2010 ACCF/AHA/AATS/ACR/ASA/SCA/SCAI/SIR/STS/SVM Guidelines for the Diagnosis and Management of Patients with Thoracic Aortic Disease. Circulation. 2010; 121: V400-Q676. Aortic aneurysm NOS (ICD10-I71.9)   Non-Vascular:   Unchanged solitary ground-glass pulmonary nodule in the anterior lingula measuring up to 0.7 cm. Repeat CT is recommended every 2 years until 5 years of stability has been established. This study marks 1 year stability. This recommendation follows the consensus statement: Guidelines for Management of Incidental Pulmonary Nodules Detected on CT Images: From the Fleischner Society 2017; Radiology 2017; 284:228-243.   Ruthann Cancer, MD   [facility]   [facility]   Recent Labs: 01/02/2021: ALT 14; BUN 25; Creatinine, Ser 1.46; Hemoglobin 13.5; Platelets 253.0; Potassium 3.9; Sodium 139; TSH 1.32  Recent Lipid Panel    Component Value Date/Time   CHOL 170 01/02/2021 1019   TRIG 170.0 (H) 01/02/2021 1019   HDL 52.20 01/02/2021 1019   CHOLHDL 3 01/02/2021 1019   VLDL 34.0 01/02/2021 1019   LDLCALC 83 01/02/2021 1019   LDLDIRECT 71.0 12/18/2019 1524    Physical Exam:    VS:  BP 112/66   Pulse 71   Ht 5' 10"  (1.778 m)   Wt 182 lb (82.6 kg)   SpO2 98%   BMI 26.11 kg/m     Wt Readings from Last 3 Encounters:  01/24/21 182 lb (82.6 kg)  01/02/21 180 lb 6.4 oz (81.8 kg)  10/08/20 187 lb 9.6 oz (85.1 kg)     GEN: Well nourished, well developed in no acute distress HEENT: Normal NECK: No JVD; No carotid bruits LYMPHATICS: No lymphadenopathy CARDIAC: S1S2  noted,RRR, no murmurs, rubs, gallops RESPIRATORY:  Clear to auscultation without rales, wheezing or rhonchi  ABDOMEN: Soft, non-tender, non-distended, +bowel sounds, no guarding. EXTREMITIES: No edema, No cyanosis, no clubbing MUSCULOSKELETAL:  No deformity  SKIN: Warm and dry NEUROLOGIC:  Alert and oriented x 3, non-focal  PSYCHIATRIC:  Normal affect, good insight  ASSESSMENT:    1. Palpitations   2. Essential (primary) hypertension   3. DOE (dyspnea on exertion)   4. Chest pain of uncertain etiology   5. Thoracic aortic aneurysm without rupture (HCC)    PLAN:     His symptoms are concerning given his risk factor he will be appropriate to proceed with a left heart catheterization in this patient.  I talked to the patient about this testing he is agreeable to proceed with this.  The patient understands that risks include but are not limited to stroke (1 in 1000), death (1 in 46), kidney failure [usually temporary] (1 in 500), bleeding (1 in 200), allergic reaction [possibly serious] (1 in 200), and agrees to proceed. Meantime we will place the patient on aspirin and statin.  Discuss his echocardiogram as well as his CTA.  He follows with CV surgery for his thoracic aneurysm. I would like to rule out a cardiovascular etiology of this palpitation, therefore at this time I would like to placed a zio patch for   14 days.   The patient is in agreement with the above plan. The patient left the office in stable condition.  The patient will follow up in   Medication Adjustments/Labs and Tests Ordered: Current medicines are reviewed at length with the patient today.  Concerns regarding medicines are outlined above.  Orders Placed This Encounter  Procedures   CBC   Basic metabolic panel   LONG TERM MONITOR (3-14 DAYS)   Meds ordered this encounter  Medications   aspirin EC 81 MG tablet    Sig: Take 1 tablet (81 mg total) by mouth daily. Swallow whole.    Dispense:  90 tablet     Refill:  3    Patient Instructions  Medication Instructions:  Your physician has recommended you make the following change in your medication:  START: Aspirin 81 mg take one tablet by mouth daily.  *If you need a refill on your cardiac medications before your next appointment, please call your pharmacy*   Lab Work: Your physician recommends that you return for lab work in: TODAY CBC, BMP If you have labs (blood work) drawn today and your tests are completely normal, you will receive your results only by: [place] (if you have [place]) OR A paper copy in the mail If you have any lab test that is abnormal or we need to change your treatment, we will call you to review the results.   Testing/Procedures: A zio monitor was ordered today. It will remain on for 7 days. You will then return monitor and event diary in provided box. It takes 1-2 weeks for report to be downloaded and returned to [place]. We will call you with the results. If monitor falls off or has orange flashing light, please call Zio for further instructions.      [place] [facility] [place], [place] [address] Dept: [phone] Loc: [phone]  Aydden Cumpian  01/24/2021  You are scheduled for a Cardiac Catheterization on Thursday, June 23 with Dr. Daneen Schick.  1. Please arrive at the [facility] (Main Entrance A) at [facility]: [address] [place], [place] [address] at 5:30 AM (This time is two hours before your procedure to ensure your preparation). Free valet parking service is available.   Special note: Every effort is made to have your procedure done on  time. Please understand that emergencies sometimes delay scheduled procedures.  2. Diet: Do not eat solid foods after midnight.  The patient may have clear liquids until 5am upon the day of the procedure.  3. Labs: YOU HAD YOUR LABS DRAWN ON  01/24/2021  4. Medication instructions in preparation for your procedure:   Contrast Allergy: No  On the morning of your procedure, take your Aspirin and any morning medicines NOT listed above.  You may use sips of water.  5. Plan for one night stay--bring personal belongings. 6. Bring a current list of your medications and current insurance cards. 7. You MUST have a responsible person to drive you home. 8. Someone MUST be with you the first 24 hours after you arrive home or your discharge will be delayed. 9. Please wear clothes that are easy to get on and off and wear slip-on shoes.  Thank you for allowing [place] to care for you!   -- [facility]     Follow-Up: At [facility], you and your health needs are our priority.  As part of our continuing mission to provide you with exceptional heart care, we have created designated Provider Care Teams.  These Care Teams include your primary Cardiologist (physician) and Advanced Practice Providers (APPs -  Physician Assistants and Nurse Practitioners) who all work together to provide you with the care you need, when you need it.  We recommend signing up for the patient portal called "MyChart".  Sign up information is provided on this After Visit Summary.  MyChart is used to connect with patients for Virtual Visits (Telemedicine).  Patients are able to view lab/test results, encounter notes, upcoming appointments, etc.  Non-urgent messages can be sent to your provider as well.   To learn more about what you can do with MyChart, go to [email or web address].    Your next appointment:   4 week(s)  The format for your next appointment:   In Person  Provider:   Berniece Salines, DO   Other Instructions    Adopting a Healthy Lifestyle.  Know what a healthy weight is for you (roughly BMI <25) and aim to maintain this   Aim for 7+ servings of fruits and vegetables daily   65-80+ fluid ounces of water or  unsweet tea for healthy kidneys   Limit to max 1 drink of alcohol per day; avoid smoking/tobacco   Limit animal fats in diet for cholesterol and heart health - choose grass fed whenever available   Avoid highly processed foods, and foods high in saturated/trans fats   Aim for low stress - take time to unwind and care for your mental health   Aim for 150 min of moderate intensity exercise weekly for heart health, and weights twice weekly for bone health   Aim for 7-9 hours of sleep daily   When it comes to diets, agreement about the perfect plan isnt easy to find, even among the experts. Experts at the [employer] developed an idea known as the Healthy Eating Plate. Just imagine a plate divided into logical, healthy portions.   The emphasis is on diet quality:   Load up on vegetables and fruits - one-half of your plate: Aim for color and variety, and remember that potatoes dont count.   Go for whole grains - one-quarter of your plate: Whole wheat, barley, wheat berries, quinoa, oats, brown rice, and foods made with them. If you want pasta, go with whole wheat pasta.  Protein power - one-quarter of your plate: Fish, chicken, beans, and nuts are all healthy, versatile protein sources. Limit red meat.   The diet, however, does go beyond the plate, offering a few other suggestions.   Use healthy plant oils, such as olive, canola, soy, corn, sunflower and peanut. Check the labels, and avoid partially hydrogenated oil, which have unhealthy trans fats.   If youre thirsty, drink water. Coffee and tea are good in moderation, but skip sugary drinks and limit milk and dairy products to one or two daily servings.   The type of carbohydrate in the diet is more important than the amount. Some sources of carbohydrates, such as vegetables, fruits, whole grains, and beans-are healthier than others.   Finally, stay active  Signed, Berniece Salines, DO  01/24/2021 10:58 AM    [place]

## 2021-01-24 NOTE — Patient Instructions (Signed)
Medication Instructions:  Your physician has recommended you make the following change in your medication:  START: Aspirin 81 mg take one tablet by mouth daily.  *If you need a refill on your cardiac medications before your next appointment, please call your pharmacy*   Lab Work: Your physician recommends that you return for lab work in: TODAY CBC, BMP If you have labs (blood work) drawn today and your tests are completely normal, you will receive your results only by: [place] (if you have [place]) OR A paper copy in the mail If you have any lab test that is abnormal or we need to change your treatment, we will call you to review the results.   Testing/Procedures: A zio monitor was ordered today. It will remain on for 7 days. You will then return monitor and event diary in provided box. It takes 1-2 weeks for report to be downloaded and returned to [place]. We will call you with the results. If monitor falls off or has orange flashing light, please call [facility] for further instructions.      [facility] [address], [place] [address] Dept: [phone] Loc: [phone]  Kesean Serviss  01/24/2021  You are scheduled for a Cardiac Catheterization on Thursday, June 23 with Dr. Daneen Schick.  1. Please arrive at the [facility] (Main Entrance A) at [facility]: [address] [place], [place] [address] at 5:30 AM (This time is two hours before your procedure to ensure your preparation). Free valet parking service is available.   Special note: Every effort is made to have your procedure done on time. Please understand that emergencies sometimes delay scheduled procedures.  2. Diet: Do not eat solid foods after midnight.  The patient may have clear liquids until 5am upon the day of the procedure.  3. Labs: YOU HAD YOUR LABS DRAWN ON 01/24/2021  4. Medication instructions in  preparation for your procedure:   Contrast Allergy: No  On the morning of your procedure, take your Aspirin and any morning medicines NOT listed above.  You may use sips of water.  5. Plan for one night stay--bring personal belongings. 6. Bring a current list of your medications and current insurance cards. 7. You MUST have a responsible person to drive you home. 8. Someone MUST be with you the first 24 hours after you arrive home or your discharge will be delayed. 9. Please wear clothes that are easy to get on and off and wear slip-on shoes.  Thank you for allowing [place] to care for you!   -- [facility]     Follow-Up: At [facility], you and your health needs are our priority.  As part of our continuing mission to provide you with exceptional heart care, we have created designated Provider Care Teams.  These Care Teams include your primary Cardiologist (physician) and Advanced Practice Providers (APPs -  Physician Assistants and Nurse Practitioners) who all work together to provide you with the care you need, when you need it.  We recommend signing up for the patient portal called "MyChart".  Sign up information is provided on this After Visit Summary.  MyChart is used to connect with patients for Virtual Visits (Telemedicine).  Patients are able to view lab/test results, encounter notes, upcoming appointments, etc.  Non-urgent messages can be sent to your provider as well.   To learn more about what you can do  with MyChart, go to [email or web address].    Your next appointment:   4 week(s)  The format for your next appointment:   In Person  Provider:   Berniece Salines, DO   Other Instructions

## 2021-01-24 NOTE — Progress Notes (Signed)
Cardiology Office Note:    Date:  01/24/2021   ID:  Luis Wilkinson, DOB 1956-02-17, MRN [id number]  PCP:  Biagio Borg, MD  Cardiologist:  Berniece Salines, DO  Electrophysiologist:  None   Referring MD: Biagio Borg, MD   No chief complaint on file. " I have been experiencing worsening shortness of breath and chest pain"  History of Present Illness:    Luis Wilkinson is a 65 y.o. male with a hx of rheumatoid arthritis, current smoker, heart catheterization in 2015 with normal coronary arteries, ascending thoracic aortic aneurysm last reported to be 4.4cm in December 2021, and hypertension.   I did see the patient back in December for a operative clearance for hand surgery.  We will repeat an echocardiogram and a CTA given his history of ascending thoracic aneurysm.  I also referred the patient to vascular surgery.  He was intermittently cleared for his hand surgery.  He was able to get his hand surgery.  He also has seen vascular surgeon.  I saw the patient in March 2022 at that time he appeared to be doing well from a cardiovascular standpoint no changes were made to his medication regimen.  He did have some shortness of breath for recommended patient get a PFT to make sure secondary lung pathology was not playing a role as his echocardiogram was normal.   In the interim the patient was unable to get his PFT but also contracted COVID-19 infection.  He had recovered well.  He tells me the last month and a half he has had dramatic change of his wellbeing.  He has been experiencing significant shortness of breath on exertion.  He is unable to do basic things like he used to he tells me he was washing his car which is unlike thing that he does but got so short of breath that he could not continue this.  He also is unable to even mow his lawn.  His wife who is here tells me that he had been very active but she is concerned giving his limitations for all of the activities. In addition to days he has  had some chest discomfort.  He described as a midsternal tightness which comes and goes.  It usually is associated with the shortness of breath at times.  And is usually is on exertion.  He does admit to significant sweating and fatigue when this is going on.  The palpitation also is new and is really a big problem for him as well.  He has not passed out he has not had any lightheadedness or dizziness.  Past Medical History:  Diagnosis Date   Aneurysm, ascending aorta ([place])    followed br Dr Hortencia Pilar   History of kidney stones    History of partial colectomy 12/18/2019   Hypertension    RA (rheumatoid arthritis) ([place])    S/P right unicompartmental knee replacement    06-20-2015  post dislocating plastic    Seronegative rheumatoid arthritis ([place]) 12/18/2019    Past Surgical History:  Procedure Laterality Date   CARDIAC CATHETERIZATION  Feb 2015    [place]   per pt normal coronary arteries   CATARACT EXTRACTION W/ INTRAOCULAR LENS IMPLANT Right Mar 2014   CYSTOSCOPY W/ URETEROSCOPY W/ LITHOTRIPSY     EXCISION SUBDERMAL NECK TUMOR  2010   benign   KNEE ARTHROSCOPY W/ MENISCECTOMY Right 01-09-2015   LEFT ELBOW RECONSTRUCTION  2010   PARTIAL KNEE ARTHROPLASTY Right 08/08/2015   Procedure: RIGHT  UNICOMPARTMENTAL KNEE REVISION PLASTIC;  Surgeon: Paralee Cancel, MD;  [facility];  Service: Orthopedics;  Laterality: Right;   PARTIAL KNEE ARTHROPLASTY Right 03/02/2016   Procedure: UNICOMPARTMENTAL RIGHT KNEE REVISION;  Surgeon: Paralee Cancel, MD;  Location: [facility];  Service: Orthopedics;  Laterality: Right;   REPLACEMENT UNICONDYLAR JOINT KNEE Right 06-20-2015   STRABISMUS SURGERY Left x6   last one --Age 32    Current Medications: Current Meds  Medication Sig   aspirin EC 81 MG tablet Take 1 tablet (81 mg total) by mouth daily. Swallow whole.   atenolol (TENORMIN) 100 MG tablet One in am and a half in pm   atorvastatin (LIPITOR) 10 MG tablet Take 1 tablet (10 mg  total) by mouth daily.   Certolizumab Pegol (CIMZIA STARTER KIT [place]) Inject into the skin.   diazepam (VALIUM) 5 MG tablet 1 tab by mouth 60 min prior to procedure   DULoxetine (CYMBALTA) 60 MG capsule Take 1 capsule (60 mg total) by mouth daily.   Glucosamine-Chondroit-Vit C-Mn (GLUCOSAMINE 1500 COMPLEX PO) Take 2 capsules by mouth daily.   leflunomide (ARAVA) 20 MG tablet Take 20 mg by mouth daily.   loratadine-pseudoephedrine (CLARITIN-D 24-HOUR) 10-240 MG 24 hr tablet Take 1 tablet by mouth daily as needed for allergies.   Multiple Vitamin (MULTIVITAMIN) tablet Take 1 tablet by mouth every morning.   mupirocin nasal ointment (BACTROBAN) 2 % Place 1 application into the nose 2 (two) times daily. Use one-half of tube in each nostril twice daily for five (5) days. After application, press sides of nose together and gently massage.   mupirocin ointment (BACTROBAN) 2 %    pantoprazole (PROTONIX) 40 MG tablet Take 30- 60 min before your first and last meals of the day   tadalafil (CIALIS) 20 MG tablet Take 0.5-1 tablets (10-20 mg total) by mouth every other day as needed for erectile dysfunction.   telmisartan-hydrochlorothiazide (MICARDIS HCT) 80-25 MG tablet Take 1 tablet by mouth daily.   temazepam (RESTORIL) 15 MG capsule TAKE 1 CAPSULE AT BEDTIME  AS NEEDED FOR SLEEP     Allergies:   Dilaudid [hydromorphone], Hydroxychloroquine sulfate, Morphine, Hydroxychloroquine, and Infliximab   Social History   Socioeconomic History   Marital status: Married    Spouse name: Not on file   Number of children: Not on file   Years of education: Not on file   Highest education level: Not on file  Occupational History   Not on file  Tobacco Use   Smoking status: Former    Packs/day: 0.50    Years: 10.00    Pack years: 5.00    Types: Cigarettes    Quit date: 07/19/1994    Years since quitting: 26.5   Smokeless tobacco: Never  Substance and Sexual Activity   Alcohol use: No   Drug use: No    Sexual activity: Not on file  Other Topics Concern   Not on file  Social History Narrative   Not on file   Social Determinants of Health   Financial Resource Strain: Not on file  Food Insecurity: Not on file  Transportation Needs: Not on file  Physical Activity: Not on file  Stress: Not on file  Social Connections: Not on file     Family History: The patient's family history includes Congestive Heart Failure in his father and mother.  ROS:   Review of Systems  Constitution: Negative for decreased appetite, fever and weight gain.  HENT: Negative for congestion, ear discharge, hoarse  voice and sore throat.   Eyes: Negative for discharge, redness, vision loss in right eye and visual halos.  Cardiovascular: Negative for chest pain, dyspnea on exertion, leg swelling, orthopnea and palpitations.  Respiratory: Negative for cough, hemoptysis, shortness of breath and snoring.   Endocrine: Negative for heat intolerance and polyphagia.  Hematologic/Lymphatic: Negative for bleeding problem. Does not bruise/bleed easily.  Skin: Negative for flushing, nail changes, rash and suspicious lesions.  Musculoskeletal: Negative for arthritis, joint pain, muscle cramps, myalgias, neck pain and stiffness.  Gastrointestinal: Negative for abdominal pain, bowel incontinence, diarrhea and excessive appetite.  Genitourinary: Negative for decreased libido, genital sores and incomplete emptying.  Neurological: Negative for brief paralysis, focal weakness, headaches and loss of balance.  Psychiatric/Behavioral: Negative for altered mental status, depression and suicidal ideas.  Allergic/Immunologic: Negative for HIV exposure and persistent infections.    EKGs/Labs/Other Studies Reviewed:    The following studies were reviewed today:   EKG: None today TTE 01/23/2020 IMPRESSIONS     1. Left ventricular ejection fraction, by estimation, is 60 to 65%. The  left ventricle has normal function. The left  ventricle has no regional  wall motion abnormalities. Left ventricular diastolic parameters are  indeterminate.   2. Right ventricular systolic function is normal. The right ventricular  size is normal. There is normal pulmonary artery systolic pressure.   3. The mitral valve is normal in structure. No evidence of mitral valve  regurgitation. No evidence of mitral stenosis.   4. The aortic valve is normal in structure. Aortic valve regurgitation is  mild. No aortic stenosis is present.   5. Aneurysm of the ascending aorta, measuring 44 mm.   6. The inferior vena cava is normal in size with greater than 50%  respiratory variability, suggesting right atrial pressure of 3 mmHg.   FINDINGS   Left Ventricle: Left ventricular ejection fraction, by estimation, is 60  to 65%. The left ventricle has normal function. The left ventricle has no  regional wall motion abnormalities. The left ventricular internal cavity  size was normal in size. There is   borderline left ventricular hypertrophy. Left ventricular diastolic  parameters are indeterminate.   Right Ventricle: The right ventricular size is normal. No increase in  right ventricular wall thickness. Right ventricular systolic function is  normal. There is normal pulmonary artery systolic pressure. The tricuspid  regurgitant velocity is 2.02 m/s, and   with an assumed right atrial pressure of 3 mmHg, the estimated right  ventricular systolic pressure is 27.0 mmHg.   Left Atrium: Left atrial size was normal in size.   Right Atrium: Right atrial size was normal in size.   Pericardium: There is no evidence of pericardial effusion.   Mitral Valve: The mitral valve is normal in structure. No evidence of  mitral valve regurgitation. No evidence of mitral valve stenosis.   Tricuspid Valve: The tricuspid valve is normal in structure. Tricuspid  valve regurgitation is not demonstrated. No evidence of tricuspid  stenosis.   Aortic Valve: The  aortic valve is normal in structure. Aortic valve  regurgitation is mild. No aortic stenosis is present.   Pulmonic Valve: The pulmonic valve was normal in structure. Pulmonic valve  regurgitation is not visualized. No evidence of pulmonic stenosis.   Aorta: The aortic root is normal in size and structure. There is an  aneurysm involving the ascending aorta measuring 44 mm.   Venous: The inferior vena cava is normal in size with greater than 50%  respiratory variability, suggesting  right atrial pressure of 3 mmHg.   IAS/Shunts: No atrial level shunt detected by color flow Doppler.      CT of the chest June 9, 2021FINDINGS: Cardiovascular: Preferential opacification of the thoracic aorta. No evidence of thoracic aortic aneurysm or dissection. Normal heart size. No pericardial effusion.   Sinues of Valsalva: 29 mm 34 x 31 mm ,unchanged   Sinotubular Junction: 37 mm ,unchanged   Ascending Aorta: 43 mm ,unchanged   Aortic Arch: 35 mm ,unchanged   Descending aorta: 26 mm at the level of the carina ,unchanged   Branch vessels: Conventional branching pattern. No significant atherosclerotic changes.   Coronary arteries: Normal origins and courses. No significant atherosclerotic calcifications.   Main pulmonary artery: 26 mm ,unchanged. No evidence of central pulmonary embolism.   Pulmonary veins: No anomalous pulmonary venous return. No evidence of left atrial appendage thrombus.   Upper abdominal vasculature: Within normal limits.   Mediastinum/Nodes: No enlarged mediastinal, hilar, or axillary lymph nodes. Thyroid gland, trachea, and esophagus demonstrate no significant findings.   Lungs/Pleura: Unchanged ground-glass pulmonary nodule in the anterior lingula measuring up to 0.7 cm. No new suspicious pulmonary nodules. No focal consolidations. No evidence of pleural effusion or pneumothorax.   Upper Abdomen: The visualized upper abdomen is within normal limits.    Musculoskeletal: No acute osseous abnormality. Multilevel degenerative changes of the thoracic spine.   Review of the MIP images confirms the above findings.   IMPRESSION: Vascular:   Similar appearing fusiform ascending thoracic aortic aneurysm measuring up to 43 mm. Recommend annual imaging followup by CTA or MRA. This recommendation follows 2010 ACCF/AHA/AATS/ACR/ASA/SCA/SCAI/SIR/STS/SVM Guidelines for the Diagnosis and Management of Patients with Thoracic Aortic Disease. Circulation. 2010; 121: X324-M010. Aortic aneurysm NOS (ICD10-I71.9)   Non-Vascular:   Unchanged solitary ground-glass pulmonary nodule in the anterior lingula measuring up to 0.7 cm. Repeat CT is recommended every 2 years until 5 years of stability has been established. This study marks 1 year stability. This recommendation follows the consensus statement: Guidelines for Management of Incidental Pulmonary Nodules Detected on CT Images: From the Fleischner Society 2017; Radiology 2017; 284:228-243.   Ruthann Cancer, MD   [facility]   [facility]   Recent Labs: 01/02/2021: ALT 14; BUN 25; Creatinine, Ser 1.46; Hemoglobin 13.5; Platelets 253.0; Potassium 3.9; Sodium 139; TSH 1.32  Recent Lipid Panel    Component Value Date/Time   CHOL 170 01/02/2021 1019   TRIG 170.0 (H) 01/02/2021 1019   HDL 52.20 01/02/2021 1019   CHOLHDL 3 01/02/2021 1019   VLDL 34.0 01/02/2021 1019   LDLCALC 83 01/02/2021 1019   LDLDIRECT 71.0 12/18/2019 1524    Physical Exam:    VS:  BP 112/66   Pulse 71   Ht 5' 10"  (1.778 m)   Wt 182 lb (82.6 kg)   SpO2 98%   BMI 26.11 kg/m     Wt Readings from Last 3 Encounters:  01/24/21 182 lb (82.6 kg)  01/02/21 180 lb 6.4 oz (81.8 kg)  10/08/20 187 lb 9.6 oz (85.1 kg)     GEN: Well nourished, well developed in no acute distress HEENT: Normal NECK: No JVD; No carotid bruits LYMPHATICS: No lymphadenopathy CARDIAC: S1S2  noted,RRR, no murmurs, rubs, gallops RESPIRATORY:  Clear to auscultation without rales, wheezing or rhonchi  ABDOMEN: Soft, non-tender, non-distended, +bowel sounds, no guarding. EXTREMITIES: No edema, No cyanosis, no clubbing MUSCULOSKELETAL:  No deformity  SKIN: Warm and dry NEUROLOGIC:  Alert and oriented x 3, non-focal  PSYCHIATRIC:  Normal affect, good insight  ASSESSMENT:    1. Palpitations   2. Essential (primary) hypertension   3. DOE (dyspnea on exertion)   4. Chest pain of uncertain etiology   5. Thoracic aortic aneurysm without rupture (HCC)    PLAN:     His symptoms are concerning given his risk factor he will be appropriate to proceed with a left heart catheterization in this patient.  I talked to the patient about this testing he is agreeable to proceed with this.  The patient understands that risks include but are not limited to stroke (1 in 1000), death (1 in 38), kidney failure [usually temporary] (1 in 500), bleeding (1 in 200), allergic reaction [possibly serious] (1 in 200), and agrees to proceed. Meantime we will place the patient on aspirin and statin.  Discuss his echocardiogram as well as his CTA.  He follows with CV surgery for his thoracic aneurysm. I would like to rule out a cardiovascular etiology of this palpitation, therefore at this time I would like to placed a zio patch for   14 days.   The patient is in agreement with the above plan. The patient left the office in stable condition.  The patient will follow up in   Medication Adjustments/Labs and Tests Ordered: Current medicines are reviewed at length with the patient today.  Concerns regarding medicines are outlined above.  Orders Placed This Encounter  Procedures   CBC   Basic metabolic panel   LONG TERM MONITOR (3-14 DAYS)   Meds ordered this encounter  Medications   aspirin EC 81 MG tablet    Sig: Take 1 tablet (81 mg total) by mouth daily. Swallow whole.    Dispense:  90 tablet     Refill:  3    Patient Instructions  Medication Instructions:  Your physician has recommended you make the following change in your medication:  START: Aspirin 81 mg take one tablet by mouth daily.  *If you need a refill on your cardiac medications before your next appointment, please call your pharmacy*   Lab Work: Your physician recommends that you return for lab work in: TODAY CBC, BMP If you have labs (blood work) drawn today and your tests are completely normal, you will receive your results only by: [place] (if you have [place]) OR A paper copy in the mail If you have any lab test that is abnormal or we need to change your treatment, we will call you to review the results.   Testing/Procedures: A zio monitor was ordered today. It will remain on for 7 days. You will then return monitor and event diary in provided box. It takes 1-2 weeks for report to be downloaded and returned to [place]. We will call you with the results. If monitor falls off or has orange flashing light, please call Z[facility] for further instructions.      [facility] [place], [place] [address] Dept: [phone] Loc: [phone]  Taiwo Fish  01/24/2021  You are scheduled for a Cardiac Catheterization on Thursday, June 23 with Dr. Daneen Schick.  1. Please arrive at the [facility] (Main Entrance A) at [facility]: [address] [place], [place] [address] at 5:30 AM (This time is two hours before your procedure to ensure your preparation). Free valet parking service is available.   Special note: Every effort is made to have your procedure done on  time. Please understand that emergencies sometimes delay scheduled procedures.  2. Diet: Do not eat solid foods after midnight.  The patient may have clear liquids until 5am upon the day of the procedure.  3. Labs: YOU HAD YOUR LABS DRAWN ON  01/24/2021  4. Medication instructions in preparation for your procedure:   Contrast Allergy: No  On the morning of your procedure, take your Aspirin and any morning medicines NOT listed above.  You may use sips of water.  5. Plan for one night stay--bring personal belongings. 6. Bring a current list of your medications and current insurance cards. 7. You MUST have a responsible person to drive you home. 8. Someone MUST be with you the first 24 hours after you arrive home or your discharge will be delayed. 9. Please wear clothes that are easy to get on and off and wear slip-on shoes.  Thank you for allowing [place] to care for you!   -- [facility]     Follow-Up: At [facility], you and your health needs are our priority.  As part of our continuing mission to provide you with exceptional heart care, we have created designated Provider Care Teams.  These Care Teams include your primary Cardiologist (physician) and Advanced Practice Providers (APPs -  Physician Assistants and Nurse Practitioners) who all work together to provide you with the care you need, when you need it.  We recommend signing up for the patient portal called "MyChart".  Sign up information is provided on this After Visit Summary.  MyChart is used to connect with patients for Virtual Visits (Telemedicine).  Patients are able to view lab/test results, encounter notes, upcoming appointments, etc.  Non-urgent messages can be sent to your provider as well.   To learn more about what you can do with MyChart, go to [email or web address].    Your next appointment:   4 week(s)  The format for your next appointment:   In Person  Provider:   Berniece Salines, DO   Other Instructions    Adopting a Healthy Lifestyle.  Know what a healthy weight is for you (roughly BMI <25) and aim to maintain this   Aim for 7+ servings of fruits and vegetables daily   65-80+ fluid ounces of water or  unsweet tea for healthy kidneys   Limit to max 1 drink of alcohol per day; avoid smoking/tobacco   Limit animal fats in diet for cholesterol and heart health - choose grass fed whenever available   Avoid highly processed foods, and foods high in saturated/trans fats   Aim for low stress - take time to unwind and care for your mental health   Aim for 150 min of moderate intensity exercise weekly for heart health, and weights twice weekly for bone health   Aim for 7-9 hours of sleep daily   When it comes to diets, agreement about the perfect plan isnt easy to find, even among the experts. Experts at the [employer] developed an idea known as the Healthy Eating Plate. Just imagine a plate divided into logical, healthy portions.   The emphasis is on diet quality:   Load up on vegetables and fruits - one-half of your plate: Aim for color and variety, and remember that potatoes dont count.   Go for whole grains - one-quarter of your plate: Whole wheat, barley, wheat berries, quinoa, oats, brown rice, and foods made with them. If you want pasta, go with whole wheat pasta.  Protein power - one-quarter of your plate: Fish, chicken, beans, and nuts are all healthy, versatile protein sources. Limit red meat.   The diet, however, does go beyond the plate, offering a few other suggestions.   Use healthy plant oils, such as olive, canola, soy, corn, sunflower and peanut. Check the labels, and avoid partially hydrogenated oil, which have unhealthy trans fats.   If youre thirsty, drink water. Coffee and tea are good in moderation, but skip sugary drinks and limit milk and dairy products to one or two daily servings.   The type of carbohydrate in the diet is more important than the amount. Some sources of carbohydrates, such as vegetables, fruits, whole grains, and beans-are healthier than others.   Finally, stay active  Signed, Berniece Salines, DO  01/24/2021 10:58 AM    [place]

## 2021-01-25 LAB — BASIC METABOLIC PANEL
BUN/Creatinine Ratio: 15 (ref 10–24)
BUN: 20 mg/dL (ref 8–27)
CO2: 26 mmol/L (ref 20–29)
Calcium: 9.4 mg/dL (ref 8.6–10.2)
Chloride: 100 mmol/L (ref 96–106)
Creatinine, Ser: 1.37 mg/dL — ABNORMAL HIGH (ref 0.76–1.27)
Glucose: 65 mg/dL (ref 65–99)
Potassium: 3.5 mmol/L (ref 3.5–5.2)
Sodium: 141 mmol/L (ref 134–144)
eGFR: 58 mL/min/{1.73_m2} — ABNORMAL LOW (ref >59)

## 2021-01-25 LAB — CBC
Hematocrit: 38.6 % (ref 37.5–51.0)
Hemoglobin: 13.2 g/dL (ref 13.0–17.7)
MCH: 33 pg (ref 26.6–33.0)
MCHC: 34.2 g/dL (ref 31.5–35.7)
MCV: 97 fL (ref 79–97)
Platelets: 218 10*3/uL (ref 150–450)
RBC: 4 x10E6/uL — ABNORMAL LOW (ref 4.14–5.80)
RDW: 12.6 % (ref 11.6–15.4)
WBC: 5.9 10*3/uL (ref 3.4–10.8)

## 2021-01-27 DIAGNOSIS — R0602 Shortness of breath: Secondary | ICD-10-CM

## 2021-01-27 DIAGNOSIS — R079 Chest pain, unspecified: Secondary | ICD-10-CM

## 2021-01-27 NOTE — H&P (Signed)
Shortness of breath and chest tightness. Asc Ao Aneurysm 4.3 cm, prior cath with normal coronary arteries 2015.

## 2021-01-28 ENCOUNTER — Telehealth: Payer: Self-pay | Admitting: *Deleted

## 2021-01-28 NOTE — Telephone Encounter (Signed)
Pt contacted pre-catheterization scheduled at [facility] for: Thursday January 30, 2021 7:30 AM Verified arrival time and place: [place] [facility]) at: 5:30 AM   No solid food after midnight prior to cath, clear liquids until 5 AM day of procedure.  Hold: Telmisartan-HCT-day before and day of procedure-GFR 58  Except hold medications AM meds can be  taken pre-cath with sips of water including: ASA 81 mg   Confirmed patient has responsible adult to drive home post procedure and be with patient first 24 hours after arriving home: yes  You are allowed ONE visitor in the waiting room during the time you are at the hospital for your procedure. Both you and your visitor must wear a mask once you enter the hospital.   Patient reports does not currently have any symptoms concerning for COVID-19 and no household members with COVID-19 like illness.      Reviewed procedure/mask/visitor instructions with patient.

## 2021-01-30 ENCOUNTER — Encounter (HOSPITAL_COMMUNITY): Payer: Self-pay | Admitting: Interventional Cardiology

## 2021-01-30 ENCOUNTER — Ambulatory Visit (HOSPITAL_COMMUNITY)
Admission: RE | Admit: 2021-01-30 | Discharge: 2021-01-30 | Disposition: A | Discharge status: Home / Self Care | Payer: Medicare Other | Attending: Interventional Cardiology | Admitting: Interventional Cardiology

## 2021-01-30 ENCOUNTER — Encounter (HOSPITAL_COMMUNITY)
Admission: RE | Disposition: A | Discharge status: Home / Self Care | Payer: Self-pay | Source: Home / Self Care | Attending: Interventional Cardiology

## 2021-01-30 ENCOUNTER — Other Ambulatory Visit: Payer: Self-pay

## 2021-01-30 DIAGNOSIS — I2782 Chronic pulmonary embolism: Secondary | ICD-10-CM

## 2021-01-30 DIAGNOSIS — Z7982 Long term (current) use of aspirin: Secondary | ICD-10-CM | POA: Insufficient documentation

## 2021-01-30 DIAGNOSIS — R0600 Dyspnea, unspecified: Secondary | ICD-10-CM

## 2021-01-30 DIAGNOSIS — I10 Essential (primary) hypertension: Secondary | ICD-10-CM | POA: Diagnosis present

## 2021-01-30 DIAGNOSIS — Z79899 Other long term (current) drug therapy: Secondary | ICD-10-CM | POA: Diagnosis not present

## 2021-01-30 DIAGNOSIS — Z888 Allergy status to other drugs, medicaments and biological substances status: Secondary | ICD-10-CM | POA: Insufficient documentation

## 2021-01-30 DIAGNOSIS — I2699 Other pulmonary embolism without acute cor pulmonale: Secondary | ICD-10-CM | POA: Diagnosis present

## 2021-01-30 DIAGNOSIS — Z885 Allergy status to narcotic agent status: Secondary | ICD-10-CM | POA: Insufficient documentation

## 2021-01-30 DIAGNOSIS — Z8616 Personal history of COVID-19: Secondary | ICD-10-CM | POA: Diagnosis not present

## 2021-01-30 DIAGNOSIS — M069 Rheumatoid arthritis, unspecified: Secondary | ICD-10-CM | POA: Insufficient documentation

## 2021-01-30 DIAGNOSIS — Z87891 Personal history of nicotine dependence: Secondary | ICD-10-CM | POA: Insufficient documentation

## 2021-01-30 DIAGNOSIS — I712 Thoracic aortic aneurysm, without rupture: Secondary | ICD-10-CM | POA: Diagnosis not present

## 2021-01-30 DIAGNOSIS — R0609 Other forms of dyspnea: Secondary | ICD-10-CM | POA: Diagnosis present

## 2021-01-30 DIAGNOSIS — I7121 Aneurysm of the ascending aorta, without rupture: Secondary | ICD-10-CM | POA: Diagnosis present

## 2021-01-30 DIAGNOSIS — R002 Palpitations: Secondary | ICD-10-CM | POA: Diagnosis present

## 2021-01-30 DIAGNOSIS — R079 Chest pain, unspecified: Secondary | ICD-10-CM | POA: Diagnosis not present

## 2021-01-30 HISTORY — PX: RIGHT/LEFT HEART CATH AND CORONARY ANGIOGRAPHY: CATH118266

## 2021-01-30 LAB — POCT I-STAT 7, (LYTES, BLD GAS, ICA,H+H)
Acid-Base Excess: 2 mmol/L (ref 0.0–2.0)
Bicarbonate: 27.5 mmol/L (ref 20.0–28.0)
Calcium, Ion: 0.96 mmol/L — ABNORMAL LOW (ref 1.15–1.40)
HCT: 29 % — ABNORMAL LOW (ref 39.0–52.0)
Hemoglobin: 9.9 g/dL — ABNORMAL LOW (ref 13.0–17.0)
O2 Saturation: 99 %
Potassium: 3.1 mmol/L — ABNORMAL LOW (ref 3.5–5.1)
Sodium: 134 mmol/L — ABNORMAL LOW (ref 135–145)
TCO2: 29 mmol/L (ref 22–32)
pCO2 arterial: 47.9 mmHg (ref 32.0–48.0)
pH, Arterial: 7.368 (ref 7.350–7.450)
pO2, Arterial: 152 mmHg — ABNORMAL HIGH (ref 83.0–108.0)

## 2021-01-30 LAB — POCT I-STAT EG7
Acid-Base Excess: 2 mmol/L (ref 0.0–2.0)
Acid-Base Excess: 2 mmol/L (ref 0.0–2.0)
Bicarbonate: 27.5 mmol/L (ref 20.0–28.0)
Bicarbonate: 27.6 mmol/L (ref 20.0–28.0)
Calcium, Ion: 0.93 mmol/L — ABNORMAL LOW (ref 1.15–1.40)
Calcium, Ion: 0.95 mmol/L — ABNORMAL LOW (ref 1.15–1.40)
HCT: 31 % — ABNORMAL LOW (ref 39.0–52.0)
HCT: 31 % — ABNORMAL LOW (ref 39.0–52.0)
Hemoglobin: 10.5 g/dL — ABNORMAL LOW (ref 13.0–17.0)
Hemoglobin: 10.5 g/dL — ABNORMAL LOW (ref 13.0–17.0)
O2 Saturation: 78 %
O2 Saturation: 78 %
Potassium: 3.1 mmol/L — ABNORMAL LOW (ref 3.5–5.1)
Potassium: 3.1 mmol/L — ABNORMAL LOW (ref 3.5–5.1)
Sodium: 137 mmol/L (ref 135–145)
Sodium: 137 mmol/L (ref 135–145)
TCO2: 29 mmol/L (ref 22–32)
TCO2: 29 mmol/L (ref 22–32)
pCO2, Ven: 46 mmHg (ref 44.0–60.0)
pCO2, Ven: 46.7 mmHg (ref 44.0–60.0)
pH, Ven: 7.38 (ref 7.250–7.430)
pH, Ven: 7.385 (ref 7.250–7.430)
pO2, Ven: 44 mmHg (ref 32.0–45.0)
pO2, Ven: 44 mmHg (ref 32.0–45.0)

## 2021-01-30 SURGERY — RIGHT/LEFT HEART CATH AND CORONARY ANGIOGRAPHY
Anesthesia: LOCAL

## 2021-01-30 MED ORDER — HYDRALAZINE HCL 20 MG/ML IJ SOLN: 10.0000 mg | INTRAMUSCULAR | Status: DC | PRN

## 2021-01-30 MED ORDER — MIDAZOLAM HCL 2 MG/2ML IJ SOLN
INTRAMUSCULAR | Status: AC
  Filled 2021-01-30: qty 2

## 2021-01-30 MED ORDER — SODIUM CHLORIDE 0.9 % WEIGHT BASED INFUSION: 1.0000 mL/kg/h | INTRAVENOUS | Status: DC

## 2021-01-30 MED ORDER — ONDANSETRON HCL 4 MG/2ML IJ SOLN: 4.0000 mg | Freq: Four times a day (QID) | INTRAMUSCULAR | Status: DC | PRN

## 2021-01-30 MED ORDER — HEPARIN SODIUM (PORCINE) 1000 UNIT/ML IJ SOLN
INTRAMUSCULAR | Status: DC | PRN
  Administered 2021-01-30: 4500 [IU] via INTRAVENOUS

## 2021-01-30 MED ORDER — MIDAZOLAM HCL 2 MG/2ML IJ SOLN
INTRAMUSCULAR | Status: DC | PRN
  Administered 2021-01-30 (×2): 1 mg via INTRAVENOUS

## 2021-01-30 MED ORDER — SODIUM CHLORIDE 0.9% FLUSH: 3.0000 mL | Freq: Two times a day (BID) | INTRAVENOUS | Status: DC

## 2021-01-30 MED ORDER — ASPIRIN 81 MG PO CHEW: 81.0000 mg | CHEWABLE_TABLET | ORAL | Status: DC

## 2021-01-30 MED ORDER — HEPARIN SODIUM (PORCINE) 1000 UNIT/ML IJ SOLN
INTRAMUSCULAR | Status: AC
  Filled 2021-01-30: qty 1

## 2021-01-30 MED ORDER — VERAPAMIL HCL 2.5 MG/ML IV SOLN
INTRAVENOUS | Status: AC
  Filled 2021-01-30: qty 2

## 2021-01-30 MED ORDER — SODIUM CHLORIDE 0.9 % IV SOLN: 250.0000 mL | INTRAVENOUS | Status: DC | PRN

## 2021-01-30 MED ORDER — LABETALOL HCL 5 MG/ML IV SOLN: 10.0000 mg | INTRAVENOUS | Status: DC | PRN

## 2021-01-30 MED ORDER — FENTANYL CITRATE (PF) 100 MCG/2ML IJ SOLN
INTRAMUSCULAR | Status: AC
  Filled 2021-01-30: qty 2

## 2021-01-30 MED ORDER — FENTANYL CITRATE (PF) 100 MCG/2ML IJ SOLN
INTRAMUSCULAR | Status: DC | PRN
  Administered 2021-01-30 (×2): 25 ug via INTRAVENOUS

## 2021-01-30 MED ORDER — SODIUM CHLORIDE 0.9% FLUSH: 3.0000 mL | INTRAVENOUS | Status: DC | PRN

## 2021-01-30 MED ORDER — ACETAMINOPHEN 325 MG PO TABS: 650.0000 mg | ORAL_TABLET | ORAL | Status: DC | PRN

## 2021-01-30 MED ORDER — VERAPAMIL HCL 2.5 MG/ML IV SOLN
INTRAVENOUS | Status: DC | PRN
  Administered 2021-01-30: 10 mL via INTRA_ARTERIAL

## 2021-01-30 MED ORDER — IOHEXOL 350 MG/ML SOLN
INTRAVENOUS | Status: DC | PRN
  Administered 2021-01-30: 70 mL

## 2021-01-30 MED ORDER — HEPARIN (PORCINE) IN NACL 1000-0.9 UT/500ML-% IV SOLN
INTRAVENOUS | Status: AC
  Filled 2021-01-30: qty 1000

## 2021-01-30 MED ORDER — LIDOCAINE HCL (PF) 1 % IJ SOLN
INTRAMUSCULAR | Status: AC
  Filled 2021-01-30: qty 30

## 2021-01-30 MED ORDER — HEPARIN (PORCINE) IN NACL 1000-0.9 UT/500ML-% IV SOLN
INTRAVENOUS | Status: DC | PRN
  Administered 2021-01-30 (×2): 500 mL

## 2021-01-30 MED ORDER — SODIUM CHLORIDE 0.9 % IV SOLN: INTRAVENOUS | Status: DC

## 2021-01-30 MED ORDER — LIDOCAINE HCL (PF) 1 % IJ SOLN
INTRAMUSCULAR | Status: DC | PRN
  Administered 2021-01-30 (×2): 2 mL

## 2021-01-30 MED ORDER — SODIUM CHLORIDE 0.9 % WEIGHT BASED INFUSION
3.0000 mL/kg/h | INTRAVENOUS | Status: AC
  Administered 2021-01-30: 3 mL/kg/h via INTRAVENOUS

## 2021-01-30 SURGICAL SUPPLY — 12 items
CATH 5FR JL3.5 JR4 ANG PIG MP (CATHETERS) ×1 IMPLANT
CATH BALLN WEDGE 5F 110CM (CATHETERS) ×1 IMPLANT
DEVICE RAD COMP TR BAND LRG (VASCULAR PRODUCTS) ×1 IMPLANT
GLIDESHEATH SLEND A-KIT 6F 22G (SHEATH) ×1 IMPLANT
GUIDEWIRE INQWIRE 1.5J.035X260 (WIRE) IMPLANT
INQWIRE 1.5J .035X260CM (WIRE) ×2
KIT HEART LEFT (KITS) ×2 IMPLANT
PACK CARDIAC CATHETERIZATION (CUSTOM PROCEDURE TRAY) ×2 IMPLANT
SHEATH GLIDE SLENDER 4/5FR (SHEATH) ×1 IMPLANT
SHEATH PROBE COVER 6X72 (BAG) ×1 IMPLANT
TRANSDUCER W/STOPCOCK (MISCELLANEOUS) ×2 IMPLANT
TUBING CIL FLEX 10 FLL-RA (TUBING) ×2 IMPLANT

## 2021-01-30 NOTE — Progress Notes (Signed)
Pt ambulated without difficulty or bleeding.   Discharged home with his wife who will drive and stay with pt x 24 hrs. 

## 2021-01-30 NOTE — Interval H&P Note (Signed)
Cath Lab Visit (complete for each Cath Lab visit)  Clinical Evaluation Leading to the Procedure:   ACS: No.  Non-ACS:    Anginal Classification: CCS III  Anti-ischemic medical therapy: Minimal Therapy (1 class of medications)  Non-Invasive Test Results: No non-invasive testing performed  Prior CABG: No previous CABG      History and Physical Interval Note:  01/30/2021 7:20 AM  Calvert Cantor  has presented today for surgery, with the diagnosis of chest pain.  The various methods of treatment have been discussed with the patient and family. After consideration of risks, benefits and other options for treatment, the patient has consented to  Procedure(s): RIGHT/LEFT HEART CATH AND CORONARY ANGIOGRAPHY (N/A) as a surgical intervention.  The patient's history has been reviewed, patient examined, no change in status, stable for surgery.  I have reviewed the patient's chart and labs.  Questions were answered to the patient's satisfaction.     Belva Crome III

## 2021-01-30 NOTE — Research (Signed)
Identify PHDEV Informed Consent   Subject Name: Luis Wilkinson  Subject met inclusion and exclusion criteria.  The informed consent form, study requirements and expectations were reviewed with the subject and questions and concerns were addressed prior to the signing of the consent form.  The subject verbalized understanding of the trail requirements.  The subject agreed to participate in the Identify PHDEV trial and signed the informed consent.  The informed consent was obtained prior to performance of any protocol-specific procedures for the subject.  A copy of the signed informed consent was given to the subject and a copy was placed in the subject's medical record.  Philemon Kingdom D 01/30/2021, [phone]

## 2021-01-30 NOTE — Discharge Instructions (Signed)
Radial Site Care  This sheet gives you information about how to care for yourself after your procedure. Your health care provider may also give you more specific instructions. If you have problems or questions, contact your health care provider. What can I expect after the procedure? After the procedure, it is common to have: Bruising and tenderness at the catheter insertion area. Follow these instructions at home: Medicines Take over-the-counter and prescription medicines only as told by your health care provider. Insertion site care Follow instructions from your health care provider about how to take care of your insertion site. Make sure you: Wash your hands with soap and water before you remove your bandage (dressing). If soap and water are not available, use hand sanitizer. May remove dressing in 24 hours. Check your insertion site every day for signs of infection. Check for: Redness, swelling, or pain. Fluid or blood. Pus or a bad smell. Warmth. Do no take baths, swim, or use a hot tub for 5 days. You may shower 24-48 hours after the procedure. Remove the dressing and gently wash the site with plain soap and water. Pat the area dry with a clean towel. Do not rub the site. That could cause bleeding. Do not apply powder or lotion to the site. Activity  For 24 hours after the procedure, or as directed by your health care provider: Do not flex or bend the affected arm. Do not push or pull heavy objects with the affected arm. Do not drive yourself home from the hospital or clinic. You may drive 24 hours after the procedure. Do not operate machinery or power tools. KEEP ARM ELEVATED THE REMAINDER OF THE DAY. Do not push, pull or lift anything that is heavier than 10 lb for 5 days. Ask your health care provider when it is okay to: Return to work or school. Resume usual physical activities or sports. Resume sexual activity. General instructions If the catheter site starts to  bleed, raise your arm and put firm pressure on the site. If the bleeding does not stop, get help right away. This is a medical emergency. DRINK PLENTY OF FLUIDS FOR THE NEXT 2-3 DAYS. No alcohol consumption for 24 hours after receiving sedation. If you went home on the same day as your procedure, a responsible adult should be with you for the first 24 hours after you arrive home. Keep all follow-up visits as told by your health care provider. This is important. Contact a health care provider if: You have a fever. You have redness, swelling, or yellow drainage around your insertion site. Get help right away if: You have unusual pain at the radial site. The catheter insertion area swells very fast. The insertion area is bleeding, and the bleeding does not stop when you hold steady pressure on the area. Your arm or hand becomes pale, cool, tingly, or numb. These symptoms may represent a serious problem that is an emergency. Do not wait to see if the symptoms will go away. Get medical help right away. Call your local emergency services (911 in the [place] Do not drive yourself to the hospital. Summary After the procedure, it is common to have bruising and tenderness at the site. Follow instructions from your health care provider about how to take care of your radial site wound. Check the wound every day for signs of infection.  This information is not intended to replace advice given to you by your health care provider. Make sure you discuss any questions you have with   your health care provider. Document Revised: 09/01/2017 Document Reviewed: 09/01/2017 Elsevier Patient Education  2020 Else[employer] Inc.  

## 2021-01-30 NOTE — Progress Notes (Signed)
Dr. Tamala Julian requested pt walk with O2 sat. Pt walked for 5 minutes and his oxygen dropped from 97% at rest to 93% with exertion. Pt c/o slight SOB and chest pressure. Dr. Tamala Julian notified in the cath lab.

## 2021-01-30 NOTE — CV Procedure (Signed)
Normal coronary arteries Normal left ventricular function and hemodynamics.  EDP 14 mmHg.  EF 55%. Normal right heart pressures.  PA O2 saturation 76%.  Cardiac output by Fick 7.1 L/min with an index of 3.53 L/min/m

## 2021-02-03 ENCOUNTER — Other Ambulatory Visit: Payer: Self-pay

## 2021-02-03 ENCOUNTER — Encounter: Payer: Self-pay | Admitting: Internal Medicine

## 2021-02-03 ENCOUNTER — Ambulatory Visit (INDEPENDENT_AMBULATORY_CARE_PROVIDER_SITE_OTHER): Payer: Medicare Other | Admitting: Internal Medicine

## 2021-02-03 VITALS — BP 114/70 | HR 66 | Temp 98.3°F | Ht 70.0 in | Wt 182.4 lb

## 2021-02-03 DIAGNOSIS — M25561 Pain in right knee: Secondary | ICD-10-CM

## 2021-02-03 DIAGNOSIS — M19049 Primary osteoarthritis, unspecified hand: Secondary | ICD-10-CM

## 2021-02-03 DIAGNOSIS — I10 Essential (primary) hypertension: Secondary | ICD-10-CM

## 2021-02-03 DIAGNOSIS — E663 Overweight: Secondary | ICD-10-CM | POA: Insufficient documentation

## 2021-02-03 DIAGNOSIS — E559 Vitamin D deficiency, unspecified: Secondary | ICD-10-CM

## 2021-02-03 DIAGNOSIS — M1991 Primary osteoarthritis, unspecified site: Secondary | ICD-10-CM

## 2021-02-03 DIAGNOSIS — R739 Hyperglycemia, unspecified: Secondary | ICD-10-CM | POA: Diagnosis not present

## 2021-02-03 DIAGNOSIS — M069 Rheumatoid arthritis, unspecified: Secondary | ICD-10-CM | POA: Insufficient documentation

## 2021-02-03 DIAGNOSIS — R0609 Other forms of dyspnea: Secondary | ICD-10-CM

## 2021-02-03 DIAGNOSIS — M79609 Pain in unspecified limb: Secondary | ICD-10-CM | POA: Insufficient documentation

## 2021-02-03 DIAGNOSIS — R0600 Dyspnea, unspecified: Secondary | ICD-10-CM

## 2021-02-03 DIAGNOSIS — L509 Urticaria, unspecified: Secondary | ICD-10-CM | POA: Insufficient documentation

## 2021-02-03 HISTORY — DX: Vitamin D deficiency, unspecified: E55.9

## 2021-02-03 HISTORY — DX: Pain in right knee: M25.561

## 2021-02-03 HISTORY — DX: Rheumatoid arthritis, unspecified: M06.9

## 2021-02-03 HISTORY — DX: Primary osteoarthritis, unspecified hand: M19.049

## 2021-02-03 HISTORY — DX: Primary osteoarthritis, unspecified site: M19.91

## 2021-02-03 HISTORY — DX: Overweight: E66.3

## 2021-02-03 HISTORY — DX: Urticaria, unspecified: L50.9

## 2021-02-03 HISTORY — DX: Pain in unspecified limb: M79.609

## 2021-02-03 MED ORDER — ALBUTEROL SULFATE HFA 108 (90 BASE) MCG/ACT IN AERS: 2.0000 | INHALATION_SPRAY | Freq: Four times a day (QID) | RESPIRATORY_TRACT | 0 refills | Status: DC | PRN

## 2021-02-03 NOTE — Progress Notes (Signed)
Patient ID: Luis Wilkinson, male   DOB: 07/05/1956, 65 y.o.   MRN: [id number]        Chief Complaint: follow up DOE       HPI:  Luis Wilkinson is a 65 y.o. male here with c/o persistent DOE as per last visit without significant change; has been chronic persistent maybe mild worsening > 1 yr without fever, chills, CP or productive cough. Recent labs , cxr, CTA chest, Echo and even cardiac cath done without specific diagnosis so far; has pulm referral pending.  No new complaints  taking vit d       Wt Readings from Last 3 Encounters:  02/03/21 182 lb 6.4 oz (82.7 kg)  01/30/21 182 lb (82.6 kg)  01/24/21 182 lb (82.6 kg)   BP Readings from Last 3 Encounters:  02/03/21 114/70  01/30/21 113/75  01/24/21 112/66         Past Medical History:  Diagnosis Date   Aneurysm, ascending aorta (HCC)    followed br Dr Priscille Kluver   History of kidney stones    History of partial colectomy 12/18/2019   Hypertension    RA (rheumatoid arthritis) (HCC)    S/P right unicompartmental knee replacement    06-20-2015  post dislocating plastic    Seronegative rheumatoid arthritis (HCC) 12/18/2019   Past Surgical History:  Procedure Laterality Date   CARDIAC CATHETERIZATION  Feb 2015    [place]   per pt normal coronary arteries   CATARACT EXTRACTION W/ INTRAOCULAR LENS IMPLANT Right Mar 2014   CYSTOSCOPY W/ URETEROSCOPY W/ LITHOTRIPSY     EXCISION SUBDERMAL NECK TUMOR  2010   benign   KNEE ARTHROSCOPY W/ MENISCECTOMY Right 01-09-2015   LEFT ELBOW RECONSTRUCTION  2010   PARTIAL KNEE ARTHROPLASTY Right 08/08/2015   Procedure: RIGHT UNICOMPARTMENTAL KNEE REVISION PLASTIC;  Surgeon: Durene Romans, MD;  Location: [facility];  Service: Orthopedics;  Laterality: Right;   PARTIAL KNEE ARTHROPLASTY Right 03/02/2016   Procedure: UNICOMPARTMENTAL RIGHT KNEE REVISION;  Surgeon: Durene Romans, MD;  Location: [facility];  Service: Orthopedics;  Laterality: Right;   REPLACEMENT UNICONDYLAR JOINT KNEE Right  06-20-2015   RIGHT/LEFT HEART CATH AND CORONARY ANGIOGRAPHY N/A 01/30/2021   Procedure: RIGHT/LEFT HEART CATH AND CORONARY ANGIOGRAPHY;  Surgeon: Lyn Records, MD;  Location: [facility];  Service: Cardiovascular;  Laterality: N/A;   STRABISMUS SURGERY Left x6   last one --Age 62    reports that he quit smoking about 26 years ago. His smoking use included cigarettes. He has a 5.00 pack-year smoking history. He has never used smokeless tobacco. He reports that he does not drink alcohol and does not use drugs. family history includes Congestive Heart Failure in his father and mother. Allergies  Allergen Reactions   Dilaudid [Hydromorphone] Shortness Of Breath and Other (See Comments)    Chest tight   Hydroxychloroquine Sulfate Swelling, Itching and Other (See Comments)   Morphine Shortness Of Breath    SEVERE   Hydroxychloroquine Rash   Infliximab Rash and Other (See Comments)    (REMICADE)SEVERE   Current Outpatient Medications on File Prior to Visit  Medication Sig Dispense Refill   aspirin EC 81 MG tablet Take 1 tablet (81 mg total) by mouth daily. Swallow whole. 90 tablet 3   atenolol (TENORMIN) 100 MG tablet One in am and a half in pm 135 tablet 3   Certolizumab Pegol (CIMZIA STARTER KIT [place]) Inject 2 Syringes into the skin every 28 (twenty-eight) days.  diazepam (VALIUM) 5 MG tablet 1 tab by mouth 60 min prior to procedure 1 tablet 0   ergocalciferol (VITAMIN D2) 1.25 MG (50000 UT) capsule Take 50,000 Units by mouth every Friday.     loratadine-pseudoephedrine (CLARITIN-D 24-HOUR) 10-240 MG 24 hr tablet Take 1 tablet by mouth daily as needed for allergies.     Multiple Vitamin (MULTIVITAMIN WITH MINERALS) TABS tablet Take 1 tablet by mouth in the morning.     mupirocin ointment (BACTROBAN) 2 % Apply 1 application topically 2 (two) times daily as needed (skin irritation/wounds).     predniSONE (DELTASONE) 5 MG tablet Take 5 mg by mouth in the morning.     RESTASIS 0.05 %  ophthalmic emulsion Place 1 drop into both eyes 2 (two) times daily.     tadalafil (CIALIS) 20 MG tablet Take 0.5-1 tablets (10-20 mg total) by mouth every other day as needed for erectile dysfunction. 10 tablet 11   telmisartan-hydrochlorothiazide (MICARDIS HCT) 80-25 MG tablet Take 1 tablet by mouth daily. 90 tablet 3   temazepam (RESTORIL) 15 MG capsule TAKE 1 CAPSULE AT BEDTIME  AS NEEDED FOR SLEEP 90 capsule 1   atorvastatin (LIPITOR) 10 MG tablet 1 tablet     DULoxetine (CYMBALTA) 60 MG capsule 1 capsule     gabapentin (NEURONTIN) 300 MG capsule 1 capsule     Glucosamine-Chondroit-Vit C-Mn (GLUCOSAMINE 1500 COMPLEX) CAPS      ibuprofen (ADVIL) 200 MG tablet 1 tablet as needed     leflunomide (ARAVA) 20 MG tablet 1 tablet     meloxicam (MOBIC) 15 MG tablet 1 tablet     pantoprazole (PROTONIX) 40 MG tablet 1 tablet     No current facility-administered medications on file prior to visit.        ROS:  All others reviewed and negative.  Objective        PE:  BP 114/70 (BP Location: Left Arm, Patient Position: Sitting, Cuff Size: Normal)   Pulse 66   Temp 98.3 F (36.8 C) (Oral)   Ht _0  (1.778 m)   Wt 182 lb 6.4 oz (82.7 kg)   SpO2 97%   BMI 26.17 kg/m                 Constitutional: Pt appears in NAD               HENT: Head: NCAT.                Right Ear: External ear normal.                 Left Ear: External ear normal.                Eyes: . Pupils are equal, round, and reactive to light. Conjunctivae and EOM are normal               Nose: without d/c or deformity               Neck: Neck supple. Gross normal ROM               Cardiovascular: Normal rate and regular rhythm.                 Pulmonary/Chest: Effort normal and breath sounds without rales or wheezing.                Abd:  Soft, NT, ND, + BS, no organomegaly  Neurological: Pt is alert. At baseline orientation, motor grossly intact               Skin: Skin is warm. No rashes, no other new  lesions, LE edema - none               Psychiatric: Pt behavior is normal without agitation   Micro: none  Cardiac tracings I have personally interpreted today:  January 30 2021 cardiac cath Conclusion  Right dominant normal coronary arteries. Normal left ventricular systolic function with EF greater than 55%.  LVEDP 14 mmHg. Normal right heart pressures with mean wedge pressure 11 mmHg.   RECOMMENDATIONS:   Exertional dyspnea does not appear to be related to pulmonary hypertension from prior pulmonary emboli, systolic or diastolic heart failure, and/or coronary artery disease with myocardial ischemia. Consider cardiopulmonary function testing. 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 Consider pulmonary disease related to rheumatoid arthritis.  Pertinent Radiological findings (summarize):   January 22, 2021 echo Left ventricular ejection fraction, by estimation, is 60 to 65%. The left ventricle has normal function. The left ventricle has no regional wall motion abnormalities. Left ventricular diastolic parameters are indeterminate. 2. Right ventricular systolic function is normal. The right ventricular size is normal. There is normal pulmonary artery systolic pressure. 3. The mitral valve is normal in structure. No evidence of mitral valve regurgitation. No evidence of mitral stenosis. 4. The aortic valve is normal in structure. Aortic valve regurgitation is mild. No aortic stenosis is present. 5. Aneurysm of the ascending aorta, measuring 44 mm. 6. The inferior vena cava is normal in size with greater than 50% respiratory variability, suggesting right atrial pressure of 3 mmHg.  January 16, 2021 CTA chest -  IMPRESSION: Vascular:   Similar appearing fusiform ascending thoracic aortic aneurysm measuring up to 43 mm. Recommend annual imaging followup by CTA or MRA. This recommendation follows 2010 ACCF/AHA/AATS/ACR/ASA/SCA/SCAI/SIR/STS/SVM Guidelines for the Diagnosis and Management  of Patients with Thoracic Aortic Disease. Circulation. 2010; 121: O035-D974. Aortic aneurysm NOS (ICD10-I71.9)   Non-Vascular:   Unchanged solitary ground-glass pulmonary nodule in the anterior lingula measuring up to 0.7 cm. Repeat CT is recommended every 2 years until 5 years of stability has been established. This study marks 1 year stability. This recommendation follows the consensus statement: Guidelines for Management of Incidental Pulmonary Nodules Detected on CT Images: From the Fleischner Society 2017; Radiology 2017; 284:228-243.     Lab Results  Component Value Date   WBC 5.9 01/24/2021   HGB 9.9 (L) 01/30/2021   HCT 29.0 (L) 01/30/2021   PLT 218 01/24/2021   GLUCOSE 65 01/24/2021   CHOL 170 01/02/2021   TRIG 170.0 (H) 01/02/2021   HDL 52.20 01/02/2021   LDLDIRECT 71.0 12/18/2019   LDLCALC 83 01/02/2021   ALT 14 01/02/2021   AST 21 01/02/2021   NA 134 (L) 01/30/2021   K 3.1 (L) 01/30/2021   CL 100 01/24/2021   CREATININE 1.37 (H) 01/24/2021   BUN 20 01/24/2021   CO2 26 01/24/2021   TSH 1.32 01/02/2021   PSA 0.55 01/02/2021   HGBA1C 5.9 01/02/2021   Assessment/Plan:  Luis Wilkinson is a 65 y.o. White or Caucasian [1] male with  has a past medical history of Aneurysm, ascending aorta ([place]), History of kidney stones, History of partial colectomy (12/18/2019), Hypertension, RA (rheumatoid arthritis) ([place]), S/P right unicompartmental knee replacement, and Seronegative rheumatoid arthritis ([place]) (12/18/2019).  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  Cathlean Cower, MD 02/05/2021 9:16 PM [facility]

## 2021-02-03 NOTE — Patient Instructions (Addendum)
I suspect you may have a degree of Interstitial Lung Disease, possibly related to the Rheumatoid Arthritis illness  You have been referred to Pulmonary twice, hopefully you will hear soon  Please take all new medication as prescribed - the inhaler  Please continue all other medications as before, and refills have been done if requested.  Please have the pharmacy call with any other refills you may need.  Please continue your efforts at being more active, low cholesterol diet, and weight control.  Please keep your appointments with your specialists as you may have planned  Please make an Appointment to return in 6 months, or sooner if needed

## 2021-02-05 ENCOUNTER — Encounter: Payer: Self-pay | Admitting: Internal Medicine

## 2021-02-05 NOTE — Assessment & Plan Note (Signed)
Lab Results  Component Value Date   HGBA1C 5.9 01/02/2021   Stable, pt to continue current medical treatment  - diet

## 2021-02-05 NOTE — Assessment & Plan Note (Signed)
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

## 2021-02-05 NOTE — Assessment & Plan Note (Signed)
Last vitamin D Lab Results  Component Value Date   VD25OH 43.38 01/02/2021   Stable, cont oral replacement

## 2021-02-05 NOTE — Assessment & Plan Note (Signed)
BP Readings from Last 3 Encounters:  02/03/21 114/70  01/30/21 113/75  01/24/21 112/66   Stable, pt to continue medical treatment tenormin, micardis hct

## 2021-02-11 ENCOUNTER — Other Ambulatory Visit: Payer: Self-pay

## 2021-02-11 MED ORDER — METOPROLOL SUCCINATE ER 50 MG PO TB24: 50.0000 mg | ORAL_TABLET | Freq: Every day | ORAL | 3 refills | Status: DC

## 2021-02-13 NOTE — Telephone Encounter (Signed)
Spoke with patient, see chart.    

## 2021-02-20 ENCOUNTER — Ambulatory Visit (INDEPENDENT_AMBULATORY_CARE_PROVIDER_SITE_OTHER): Payer: Medicare Other | Admitting: Cardiothoracic Surgery

## 2021-02-20 ENCOUNTER — Other Ambulatory Visit: Payer: Self-pay

## 2021-02-20 ENCOUNTER — Other Ambulatory Visit: Payer: Medicare Other

## 2021-02-20 VITALS — BP 120/78 | HR 100 | Resp 20 | Ht 70.0 in | Wt 182.0 lb

## 2021-02-20 DIAGNOSIS — I7121 Aneurysm of the ascending aorta, without rupture: Secondary | ICD-10-CM

## 2021-02-20 DIAGNOSIS — I712 Thoracic aortic aneurysm, without rupture: Secondary | ICD-10-CM

## 2021-02-20 NOTE — Progress Notes (Signed)
Chief complaint: Aneurysm surveillance  History of present illness: 65 year old man returns for aneurysm surveillance.  He was last seen in January.  Since then his blood pressure has been relatively well controlled but he has had increased symptoms from cardiac dysrhythmias and recently underwent cardiac rhythm monitoring showing supraventricular tachycardia with extreme rates.  He states that he is able to do little in regards to physical activity due to fluttering in the chest and shortness of breath.  Echocardiogram in June of this year which showed normal LV function and very mild to trivial aortic valve regurgitation.  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   Droopy eyelid, left 12/18/2019   History of partial colectomy 12/18/2019   Seronegative rheumatoid arthritis ([place]) 12/18/2019   Insomnia 12/18/2019   Nasal sore 12/18/2019   Allergic rhinitis 12/18/2019   Acute sinusitis 02/22/2020   Chronic low back pain 02/22/2020   Anemia 07/03/2020   Hyperglycemia 07/04/2020   Acetabular labrum tear 10/10/2019   Colonic mass 10/26/2019   Mass 05/04/2017   Nuclear sclerotic cataract of left eye 04/21/2016   Palpitations 06/11/2015   Presence of right artificial knee joint 03/02/2016   Pain in joint of right hip 09/22/2018   Aneurysm, ascending aorta ([place])    History of kidney stones    Hypertension    RA (rheumatoid arthritis) ([place])    S/P right unicompartmental knee replacement    Body mass index (BMI) 27.0-27.9, adult 03/11/2020   Other chronic pain 03/12/2020   Transient neurological  symptoms 03/12/2020   Ground glass opacity present on imaging of lung 01/02/2021   Dysphagia 01/02/2021   Chest pain of uncertain etiology 53/97/6734   Localized, primary osteoarthritis of hand 02/03/2021   Pain in limb 02/03/2021   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 8 g 0   aspirin EC 81 MG tablet Take 1 tablet (81 mg total) by mouth daily. Swallow whole. 90 tablet 3   atorvastatin (LIPITOR) 10 MG tablet Take 10 mg by mouth daily.     Certolizumab Pegol (CIMZIA STARTER KIT [place]) Inject 2 Syringes into the skin every 28 (twenty-eight) days.     DULoxetine (CYMBALTA) 60 MG capsule Take 60 mg by mouth daily.     ergocalciferol (VITAMIN D2) 1.25 MG (50000 UT) capsule Take 50,000 Units by mouth every Friday.     Glucosamine-Chondroit-Vit C-Mn (GLUCOSAMINE 1500 COMPLEX) CAPS Take 1 capsule by mouth daily.     ibuprofen (ADVIL) 200 MG tablet Take 200 mg by mouth as needed for pain.     leflunomide (ARAVA) 20 MG tablet Take 20 mg by mouth daily.  loratadine-pseudoephedrine (CLARITIN-D 24-HOUR) 10-240 MG 24 hr tablet Take 1 tablet by mouth daily as needed for allergies.     metoprolol succinate (TOPROL-XL) 50 MG 24 hr tablet Take 1 tablet (50 mg total) by mouth daily. Take with or immediately following a meal. 90 tablet 3   Multiple Vitamin (MULTIVITAMIN WITH MINERALS) TABS tablet Take 1 tablet by mouth in the morning.     pantoprazole (PROTONIX) 40 MG tablet Take 40 mg by  mouth daily.     predniSONE (DELTASONE) 5 MG tablet Take 5 mg by mouth in the morning.     RESTASIS 0.05 % ophthalmic emulsion Place 1 drop into both eyes 2 (two) times daily.     telmisartan-hydrochlorothiazide (MICARDIS HCT) 80-25 MG tablet Take 1 tablet by mouth daily. 90 tablet 3   temazepam (RESTORIL) 15 MG capsule TAKE 1 CAPSULE AT BEDTIME  AS NEEDED FOR SLEEP 90 capsule 1   meloxicam (MOBIC) 15 MG tablet Take 15 mg by mouth as needed for pain. (Patient not taking: Reported on 02/20/2021)     mupirocin ointment (BACTROBAN) 2 % Apply 1 application topically 2 (two) times daily as needed for rash (skin irritation/wounds). (Patient not taking: Reported on 02/20/2021)     tadalafil (CIALIS) 20 MG tablet Take 0.5-1 tablets (10-20 mg total) by mouth every other day as needed for erectile dysfunction. (Patient not taking: Reported on 02/20/2021) 10 tablet 11   No current facility-administered medications on file prior to visit.  Physical exam: BP 120/78   Pulse 100   Resp 20   Ht 5' 10" (1.778 m)   Wt 82.6 kg   SpO2 95% Comment: RA  BMI 26.11 kg/m  Well-appearing no acute distress HEENT: Normocephalic atraumatic no JVD Chest: Clear to auscultation Cardiac: Regular rate and rhythm no murmur Extremities: Warm and well-perfused  Imaging: I have personally reviewed his CT scan of the chest from January 16, 2021 which demonstrates a stable roughly 4.7 to 4.8 cm ascending aortic aneurysm.  Impression/plan: Stable sub-5 cm ascending aortic aneurysm posing very little risk for urgent condition; follow-up in 1 year SVT which is highly symptomatic: Refer to EP cardiology for assessment & intervention.

## 2021-02-21 ENCOUNTER — Ambulatory Visit (INDEPENDENT_AMBULATORY_CARE_PROVIDER_SITE_OTHER): Payer: Medicare Other | Admitting: Cardiology

## 2021-02-21 ENCOUNTER — Encounter: Payer: Self-pay | Admitting: Cardiology

## 2021-02-21 VITALS — BP 120/86 | HR 98 | Ht 70.0 in | Wt 183.2 lb

## 2021-02-21 DIAGNOSIS — I471 Supraventricular tachycardia: Secondary | ICD-10-CM

## 2021-02-21 DIAGNOSIS — I472 Ventricular tachycardia: Secondary | ICD-10-CM

## 2021-02-21 DIAGNOSIS — I4729 Other ventricular tachycardia: Secondary | ICD-10-CM

## 2021-02-21 DIAGNOSIS — F17200 Nicotine dependence, unspecified, uncomplicated: Secondary | ICD-10-CM | POA: Diagnosis not present

## 2021-02-21 DIAGNOSIS — R0602 Shortness of breath: Secondary | ICD-10-CM

## 2021-02-21 DIAGNOSIS — I719 Aortic aneurysm of unspecified site, without rupture: Secondary | ICD-10-CM

## 2021-02-21 DIAGNOSIS — I4719 Other supraventricular tachycardia: Secondary | ICD-10-CM

## 2021-02-21 MED ORDER — FLECAINIDE ACETATE 50 MG PO TABS: 50.0000 mg | ORAL_TABLET | Freq: Two times a day (BID) | ORAL | 3 refills | Status: DC

## 2021-02-21 NOTE — Progress Notes (Signed)
Cardiology Office Note:    Date:  02/21/2021   ID:  Luis Wilkinson, DOB 05/27/1956, MRN [id number]  PCP:  Biagio Borg, MD  Cardiologist:  Berniece Salines, DO  Electrophysiologist:  None   Referring MD: Biagio Borg, MD   I am still short of breath  History of Present Illness:    Luis Wilkinson is a 65 y.o. male with a hx of 1 with arthritis, current smoker, recent heart catheterization (right and left heart catheterization) with no evidence of coronary artery disease and normal right-sided pressures, ascending aortic aneurysm 43 mm on imaging done on January 16, 2021 reported to be unchanged from his previous study which was done in December 2021    I did see the patient back in December for a operative clearance for hand surgery.  We will repeat an echocardiogram and a CTA given his history of ascending thoracic aneurysm.  I also referred the patient to vascular surgery.  He was intermittently cleared for his hand surgery.  He was able to get his hand surgery.  He also has seen vascular surgeon.   I saw the patient in March 2022 at that time he appeared to be doing well from a cardiovascular standpoint no changes were made to his medication regimen.  He did have some shortness of breath for recommended patient get a PFT to make sure secondary lung pathology was not playing a role as his echocardiogram was normal.  He was seen on January 30, 2021 giving the worsening of symptoms are very out of proportion with his shortness of breath I recommended patient undergo left and right heart catheterization.  At that time I also place a ZIO monitor on the patient.  He was able to get a heart catheterization which was normal  Past Medical History:  Diagnosis Date   Acetabular labrum tear 10/10/2019   Acute sinusitis 02/22/2020   Allergic rhinitis 12/18/2019   Anemia 07/03/2020   Aneurysm, ascending aorta ([place])    followed br Dr Hortencia Pilar   Ascending aortic aneurysm ([place]) 06/11/2015   Body mass index (BMI)  27.0-27.9, adult 03/11/2020   Chest pain of uncertain etiology 09/29/2540   Chronic low back pain 02/22/2020   Colonic mass 10/26/2019   DOE (dyspnea on exertion) 03/16/2019   Onset early June 2020 with assoc chest tightness  - CTa 02/23/19 several segmental and subsegmental PE with micronodular lung dz ? Etiology with no venous dopplers  - 03/16/2019   Walked RA  2 laps @  approx 2110f each @ nl pace  stopped due to  End of study with some sob and chest tightness with sats 98%   - trial off acei and max rx for gerd 03/16/2019  - Echo 03/21/2019   No PH  - 03/30/2019   Walked    Droopy eyelid, left 12/18/2019   Dysphagia 01/02/2021   Eosinophilia 03/31/2019   Onset of asthma as child / [facility] eval around 2015    Essential (primary) hypertension 06/11/2015   Change from ACE inhibitor to ARB 03/16/2019 due to unexplained dry cough and chest tightness > completely resolved as of 05/23/2019    Ground glass opacity present on imaging of lung 01/02/2021   History of kidney stones    History of partial colectomy 12/18/2019   History of prosthetic unicompartmental arthroplasty of right knee 03/02/2016   Hyperglycemia 07/04/2020   Hypertension    Insomnia 12/18/2019   Localized, primary osteoarthritis of hand 02/03/2021   Mass 05/04/2017  Nasal sore 12/18/2019   Nuclear sclerotic cataract of left eye 04/21/2016   Other chronic pain 03/12/2020   Other paralytic strabismus, 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   Overweight 02/03/2021   Pain in joint of right hip 09/22/2018   Pain in limb 02/03/2021   Pain in right knee 02/03/2021   Palpitations 06/11/2015   Presence of right artificial knee joint 03/02/2016   Primary osteoarthritis 02/03/2021   Pulmonary emboli ([place]) 05/24/2019     CTa 02/23/19 pos for PE 02/23/19  Venous dopplers not done, echo ok    RA (rheumatoid arthritis) ([place])    Rheumatoid arthritis ([place]) 02/03/2021   S/P right unicompartmental knee replacement    06-20-2015  post dislocating plastic    Seronegative rheumatoid arthritis  ([place]) 12/18/2019   Status post right unicompartmental knee replacement 03/03/2016   Transient neurological symptoms 03/12/2020   Urticaria 02/03/2021   Vitamin D deficiency 02/03/2021    Past Surgical History:  Procedure Laterality Date   CARDIAC CATHETERIZATION  Feb 2015    [place]   per pt normal coronary arteries   CATARACT EXTRACTION W/ INTRAOCULAR LENS IMPLANT Right Mar 2014   CYSTOSCOPY W/ URETEROSCOPY W/ LITHOTRIPSY     EXCISION SUBDERMAL NECK TUMOR  2010   benign   KNEE ARTHROSCOPY W/ MENISCECTOMY Right 01-09-2015   LEFT ELBOW RECONSTRUCTION  2010   PARTIAL KNEE ARTHROPLASTY Right 08/08/2015   Procedure: RIGHT UNICOMPARTMENTAL KNEE REVISION PLASTIC;  Surgeon: Paralee Cancel, MD;  Location: [facility];  Service: Orthopedics;  Laterality: Right;   PARTIAL KNEE ARTHROPLASTY Right 03/02/2016   Procedure: UNICOMPARTMENTAL RIGHT KNEE REVISION;  Surgeon: Paralee Cancel, MD;  Location: [facility];  Service: Orthopedics;  Laterality: Right;   REPLACEMENT UNICONDYLAR JOINT KNEE Right 06-20-2015   RIGHT/LEFT HEART CATH AND CORONARY ANGIOGRAPHY N/A 01/30/2021   Procedure: RIGHT/LEFT HEART CATH AND CORONARY ANGIOGRAPHY;  Surgeon: Belva Crome, MD;  Location: [facility];  Service: Cardiovascular;  Laterality: N/A;   STRABISMUS SURGERY Left x6   last one --Age 77    Current Medications: Current Meds  Medication Sig   albuterol (VENTOLIN HFA) 108 (90 Base) MCG/ACT inhaler Inhale 2 puffs into the lungs every 6 (six) hours as needed for wheezing or shortness of breath.   aspirin EC 81 MG tablet Take 1 tablet (81 mg total) by mouth daily. Swallow whole.   atorvastatin (LIPITOR) 10 MG tablet Take 10 mg by mouth daily.   Certolizumab Pegol (CIMZIA STARTER KIT [place]) Inject 2 Syringes into the skin every 28 (twenty-eight) days.   DULoxetine (CYMBALTA) 60 MG capsule Take 60 mg by mouth daily.   ergocalciferol (VITAMIN D2) 1.25 MG (50000 UT) capsule Take 50,000 Units by mouth every  Friday.   flecainide (TAMBOCOR) 50 MG tablet Take 1 tablet (50 mg total) by mouth 2 (two) times daily.   Glucosamine-Chondroit-Vit C-Mn (GLUCOSAMINE 1500 COMPLEX) CAPS Take 1 capsule by mouth daily.   ibuprofen (ADVIL) 200 MG tablet Take 200 mg by mouth as needed for pain.   leflunomide (ARAVA) 20 MG tablet Take 20 mg by mouth daily.   loratadine-pseudoephedrine (CLARITIN-D 24-HOUR) 10-240 MG 24 hr tablet Take 1 tablet by mouth daily as needed for allergies.   metoprolol succinate (TOPROL-XL) 50 MG 24 hr tablet Take 1 tablet (50 mg total) by mouth daily. Take with or immediately following a meal.   Multiple Vitamin (MULTIVITAMIN WITH MINERALS) TABS tablet Take 1 tablet by mouth in the morning.   mupirocin ointment (BACTROBAN) 2 % Apply  1 application topically 2 (two) times daily as needed for rash (skin irritation/wounds).   pantoprazole (PROTONIX) 40 MG tablet Take 40 mg by mouth daily.   predniSONE (DELTASONE) 5 MG tablet Take 5 mg by mouth in the morning.   RESTASIS 0.05 % ophthalmic emulsion Place 1 drop into both eyes 2 (two) times daily.   tadalafil (CIALIS) 20 MG tablet Take 0.5-1 tablets (10-20 mg total) by mouth every other day as needed for erectile dysfunction.   telmisartan-hydrochlorothiazide (MICARDIS HCT) 80-25 MG tablet Take 1 tablet by mouth daily.   temazepam (RESTORIL) 15 MG capsule TAKE 1 CAPSULE AT BEDTIME  AS NEEDED FOR SLEEP     Allergies:   Dilaudid [hydromorphone], Hydroxychloroquine sulfate, Morphine, Hydroxychloroquine, and Infliximab   Social History   Socioeconomic History   Marital status: Married    Spouse name: Not on file   Number of children: Not on file   Years of education: Not on file   Highest education level: Not on file  Occupational History   Not on file  Tobacco Use   Smoking status: Former    Packs/day: 0.50    Years: 10.00    Pack years: 5.00    Types: Cigarettes    Quit date: 07/19/1994    Years since quitting: 26.6   Smokeless  tobacco: Never  Substance and Sexual Activity   Alcohol use: No   Drug use: No   Sexual activity: Not on file  Other Topics Concern   Not on file  Social History Narrative   Not on file   Social Determinants of Health   Financial Resource Strain: Not on file  Food Insecurity: Not on file  Transportation Needs: Not on file  Physical Activity: Not on file  Stress: Not on file  Social Connections: Not on file     Family History: The patient's family history includes Congestive Heart Failure in his father and mother.  ROS:   Review of Systems  Constitution: Negative for decreased appetite, fever and weight gain.  HENT: Negative for congestion, ear discharge, hoarse voice and sore throat.   Eyes: Negative for discharge, redness, vision loss in right eye and visual halos.  Cardiovascular: Negative for chest pain, dyspnea on exertion, leg swelling, orthopnea and palpitations.  Respiratory: Negative for cough, hemoptysis, shortness of breath and snoring.   Endocrine: Negative for heat intolerance and polyphagia.  Hematologic/Lymphatic: Negative for bleeding problem. Does not bruise/bleed easily.  Skin: Negative for flushing, nail changes, rash and suspicious lesions.  Musculoskeletal: Negative for arthritis, joint pain, muscle cramps, myalgias, neck pain and stiffness.  Gastrointestinal: Negative for abdominal pain, bowel incontinence, diarrhea and excessive appetite.  Genitourinary: Negative for decreased libido, genital sores and incomplete emptying.  Neurological: Negative for brief paralysis, focal weakness, headaches and loss of balance.  Psychiatric/Behavioral: Negative for altered mental status, depression and suicidal ideas.  Allergic/Immunologic: Negative for HIV exposure and persistent infections.    EKGs/Labs/Other Studies Reviewed:    The following studies were reviewed today:   EKG:  The ekg ordered today demonstrates    Right and left heart  catheterization Right dominant normal coronary arteries. Normal left ventricular systolic function with EF greater than 55%.  LVEDP 14 mmHg. Normal right heart pressures with mean wedge pressure 11 mmHg.  RECOMMENDATIONS:  Exertional dyspnea does not appear to be related to pulmonary hypertension from prior pulmonary emboli, systolic or diastolic heart failure, and/or coronary artery disease with myocardial ischemia. Consider cardiopulmonary function testing. 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 Consider  pulmonary disease related to rheumatoid arthritis.   Recent Labs: 01/02/2021: ALT 14; TSH 1.32 01/24/2021: BUN 20; Creatinine, Ser 1.37; Platelets 218 01/30/2021: Hemoglobin 9.9; Potassium 3.1; Sodium 134  Recent Lipid Panel    Component Value Date/Time   CHOL 170 01/02/2021 1019   TRIG 170.0 (H) 01/02/2021 1019   HDL 52.20 01/02/2021 1019   CHOLHDL 3 01/02/2021 1019   VLDL 34.0 01/02/2021 1019   LDLCALC 83 01/02/2021 1019   LDLDIRECT 71.0 12/18/2019 1524    Physical Exam:    VS:  BP 120/86   Pulse 98   Ht 5' 10"  (1.778 m)   Wt 183 lb 3.2 oz (83.1 kg)   SpO2 98%   BMI 26.29 kg/m     Wt Readings from Last 3 Encounters:  02/21/21 183 lb 3.2 oz (83.1 kg)  02/20/21 182 lb (82.6 kg)  02/03/21 182 lb 6.4 oz (82.7 kg)     GEN: Well nourished, well developed in no acute distress HEENT: Normal NECK: No JVD; No carotid bruits LYMPHATICS: No lymphadenopathy CARDIAC: S1S2 noted,RRR, no murmurs, rubs, gallops RESPIRATORY:  Clear to auscultation without rales, wheezing or rhonchi  ABDOMEN: Soft, non-tender, non-distended, +bowel sounds, no guarding. EXTREMITIES: No edema, No cyanosis, no clubbing MUSCULOSKELETAL:  No deformity  SKIN: Warm and dry NEUROLOGIC:  Alert and oriented x 3, non-focal PSYCHIATRIC:  Normal affect, good insight  ASSESSMENT:    1. SOB (shortness of breath)   2. NSVT (nonsustained ventricular tachycardia) ([place])   3. PAT (paroxysmal atrial  tachycardia) ([facility])   4. Smoker   5. Aortic aneurysm without rupture, unspecified portion of aorta ([facility])    PLAN:     During his visit today I was able to walk the patient for a few minutes and at the end of this walk his oxygen saturation has dropped to 90%.  During which time he was short of breath but he quickly recovered less than a minute going up to 96% while sitting.  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  He may eventually need cardiopulmonary testing as well.  Once he is been evaluated by pulmonary and if his shortness of breath is not pulmonary in nature we need to get him worked up for platypnea orthodeoxia syndrome starting with a TEE.  However the patient tells me that he does have difficulty swallowing at times so if we need to proceed with a TEE he would need to be evaluated by GI and endoscopy prior.  His ZIO monitor which I discussed with him in the office today as well showed short run of NSVT and paroxysmal SVT which is likely atrial tachycardia.  I started the patient on metoprolol succinate 50 mg daily but he tells me he still feels symptoms.  I am going to add antiarrhythmics to his regimen flecainide 50 mg twice a day.  He will remain on his beta-blocker as well.  He tells me that he had been referred to EP by CT surgery.  Of note diastolic dysfunction is also a consideration for his shortness of breath here, but we will continue work-up as above for now.  The plan was discussed with the patient and his wife and they are both agreeable.  The patient is in agreement with the above plan. The  patient left the office in stable condition.  The patient will follow up in   Medication Adjustments/Labs and Tests Ordered: Current medicines are  reviewed at length with the patient today.  Concerns regarding medicines are outlined above.  No orders of the defined types were placed in this encounter.  Meds ordered this encounter  Medications   flecainide (TAMBOCOR) 50 MG tablet    Sig: Take 1 tablet (50 mg total) by mouth 2 (two) times daily.    Dispense:  180 tablet    Refill:  3    Patient Instructions  Medication Instructions:  Your physician has recommended you make the following change in your medication:  START: Flecainide 50 mg twice daily  *If you need a refill on your cardiac medications before your next appointment, please call your pharmacy*   Lab Work: None If you have labs (blood work) drawn today and your tests are completely normal, you will receive your results only by: [place] (if you have MyChart) OR A paper copy in the mail If you have any lab test that is abnormal or we need to change your treatment, we will call you to review the results.   Testing/Procedures: Your physician has recommended that you have a pulmonary function test. Pulmonary Function Tests are a group of tests that measure how well air moves in and out of your lungs.   Follow-Up: At [facility], you and your health needs are our priority.  As part of our continuing mission to provide you with exceptional heart care, we have created designated Provider Care Teams.  These Care Teams include your primary Cardiologist (physician) and Advanced Practice Providers (APPs -  Physician Assistants and Nurse Practitioners) who all work together to provide you with the care you need, when you need it.  We recommend signing up for the patient portal called "MyChart".  Sign up information is provided on this After Visit Summary.  MyChart is used to connect with patients for Virtual Visits (Telemedicine).  Patients are able to view lab/test results, encounter notes, upcoming appointments, etc.  Non-urgent messages can be sent to your provider  as well.   To learn more about what you can do with MyChart, go to [email or web address].    Your next appointment:   After you see pulmonology   The format for your next appointment:   In Person    Other Instructions    Adopting a Healthy Lifestyle.  Know what a healthy weight is for you (roughly BMI <25) and aim to maintain this   Aim for 7+ servings of fruits and vegetables daily   65-80+ fluid ounces of water or unsweet tea for healthy kidneys   Limit to max 1 drink of alcohol per day; avoid smoking/tobacco   Limit animal fats in diet for cholesterol and heart health - choose grass fed whenever available   Avoid highly processed foods, and foods high in saturated/trans fats   Aim for low stress - take time to unwind and care for your mental health   Aim for 150 min of moderate intensity exercise weekly for heart health, and weights twice weekly for bone health   Aim for 7-9 hours of sleep daily   When it comes to diets, agreement about the perfect plan isnt easy to find, even among the experts. Experts at the [employer] developed an idea known as the Healthy Eating Plate. Just imagine a plate divided into logical, healthy portions.   The emphasis  is on diet quality:   Load up on vegetables and fruits - one-half of your plate: Aim for color and variety, and remember that potatoes dont count.   Go for whole grains - one-quarter of your plate: Whole wheat, barley, wheat berries, quinoa, oats, brown rice, and foods made with them. If you want pasta, go with whole wheat pasta.   Protein power - one-quarter of your plate: Fish, chicken, beans, and nuts are all healthy, versatile protein sources. Limit red meat.   The diet, however, does go beyond the plate, offering a few other suggestions.   Use healthy plant oils, such as olive, canola, soy, corn, sunflower and peanut. Check the labels, and avoid partially hydrogenated oil, which have unhealthy  trans fats.   If youre thirsty, drink water. Coffee and tea are good in moderation, but skip sugary drinks and limit milk and dairy products to one or two daily servings.   The type of carbohydrate in the diet is more important than the amount. Some sources of carbohydrates, such as vegetables, fruits, whole grains, and beans-are healthier than others.   Finally, stay active  Signed, Berniece Salines, DO  02/21/2021 2:23 PM    [place]

## 2021-02-21 NOTE — Patient Instructions (Addendum)
Medication Instructions:  Your physician has recommended you make the following change in your medication:  START: Flecainide 50 mg twice daily  *If you need a refill on your cardiac medications before your next appointment, please call your pharmacy*   Lab Work: None If you have labs (blood work) drawn today and your tests are completely normal, you will receive your results only by: [place] (if you have MyChart) OR A paper copy in the mail If you have any lab test that is abnormal or we need to change your treatment, we will call you to review the results.   Testing/Procedures: Your physician has recommended that you have a pulmonary function test. Pulmonary Function Tests are a group of tests that measure how well air moves in and out of your lungs.   Follow-Up: At [facility], you and your health needs are our priority.  As part of our continuing mission to provide you with exceptional heart care, we have created designated Provider Care Teams.  These Care Teams include your primary Cardiologist (physician) and Advanced Practice Providers (APPs -  Physician Assistants and Nurse Practitioners) who all work together to provide you with the care you need, when you need it.  We recommend signing up for the patient portal called "MyChart".  Sign up information is provided on this After Visit Summary.  MyChart is used to connect with patients for Virtual Visits (Telemedicine).  Patients are able to view lab/test results, encounter notes, upcoming appointments, etc.  Non-urgent messages can be sent to your provider as well.   To learn more about what you can do with MyChart, go to [email or web address].    Your next appointment:   After you see pulmonology   The format for your next appointment:   In Person    Other Instructions

## 2021-03-10 ENCOUNTER — Ambulatory Visit: Payer: Medicare Other | Admitting: Cardiology

## 2021-03-11 ENCOUNTER — Other Ambulatory Visit: Payer: Self-pay

## 2021-03-11 ENCOUNTER — Encounter: Payer: Self-pay | Admitting: Cardiology

## 2021-03-11 ENCOUNTER — Ambulatory Visit (INDEPENDENT_AMBULATORY_CARE_PROVIDER_SITE_OTHER): Payer: Medicare Other | Admitting: Cardiology

## 2021-03-11 VITALS — BP 118/80 | HR 60 | Ht 71.0 in | Wt 182.0 lb

## 2021-03-11 DIAGNOSIS — I471 Supraventricular tachycardia: Secondary | ICD-10-CM

## 2021-03-11 MED ORDER — METOPROLOL SUCCINATE ER 100 MG PO TB24: 100.0000 mg | ORAL_TABLET | Freq: Every day | ORAL | 2 refills | Status: DC

## 2021-03-11 NOTE — Patient Instructions (Addendum)
Medication Instructions:  Your physician has recommended you make the following change in your medication:  INCREASE Toprol to 100 mg daily  *If you need a refill on your cardiac medications before your next appointment, please call your pharmacy*   Lab Work: None ordered   Testing/Procedures: None ordered   Follow-Up: At [facility], you and your health needs are our priority.  As part of our continuing mission to provide you with exceptional heart care, we have created designated Provider Care Teams.  These Care Teams include your primary Cardiologist (physician) and Advanced Practice Providers (APPs -  Physician Assistants and Nurse Practitioners) who all work together to provide you with the care you need, when you need it.  Your next appointment:   As needed   The format for your next appointment:   In Person  Provider:   Allegra Lai, MD    Thank you for choosing [facility]!!   Trinidad Curet, RN [phone]

## 2021-03-11 NOTE — Progress Notes (Signed)
Electrophysiology Office Note   Date:  03/11/2021   ID:  Luis Wilkinson, DOB [birth year], MRN [id number]  PCP:  Biagio Borg, MD  Cardiologist:  [facility] Primary Electrophysiologist:  Nyjae Hodge Meredith Leeds, MD    Chief Complaint: SVT   History of Present Illness: Luis Wilkinson is a 65 y.o. male who is being seen today for the evaluation of SVT at the request of Atkins, Glenice Bow, MD. Presenting today for electrophysiology evaluation.  He has a history significant for tobacco abuse, hypertension, hyperlipidemia, ascending aortic aneurysm.  He wore a cardiac monitor that showed episodes of SVT.  He had a left heart catheterization that showed no evidence of coronary artery disease and an echo with a normal ejection fraction.  June 2022, he started to have episodes of shortness of breath.  His heart catheterization at the time was normal.  He was since started on both metoprolol and flecainide.  Today, he denies symptoms of chest pain, orthopnea, PND, lower extremity edema, claudication, dizziness, presyncope, syncope, bleeding, or neurologic sequela. The patient is tolerating medications without difficulties.  He had COVID this past winter.  Since then, he has had a constellation of symptoms including palpitations, shortness of breath, fatigue.  This occurs when he exerts himself.  He wore a cardiac monitor that showed episodes of sinus rhythm and sinus tachycardia when he had palpitations.  He had 1 run of SVT lasting 21 beats there were potentially associated with symptoms.  He has no chest pain associated with this.   Past Medical History:  Diagnosis Date   Acetabular labrum tear 10/10/2019   Acute sinusitis 02/22/2020   Allergic rhinitis 12/18/2019   Anemia 07/03/2020   Aneurysm, ascending aorta (HCC)    followed br Dr Hortencia Pilar   Ascending aortic aneurysm ([place]) 06/11/2015   Body mass index (BMI) 27.0-27.9, adult 03/11/2020   Chest pain of uncertain etiology 2/35/5732   Chronic low back pain  02/22/2020   Colonic mass 10/26/2019   DOE (dyspnea on exertion) 03/16/2019   Onset early June 2020 with assoc chest tightness  - CTa 02/23/19 several segmental and subsegmental PE with micronodular lung dz ? Etiology with no venous dopplers  - 03/16/2019   Walked RA  2 laps @  approx 252f each @ nl pace  stopped due to  End of study with some sob and chest tightness with sats 98%   - trial off acei and max rx for gerd 03/16/2019  - Echo 03/21/2019   No PH  - 03/30/2019   Walked    Droopy eyelid, left 12/18/2019   Dysphagia 01/02/2021   Eosinophilia 03/31/2019   Onset of asthma as child / [facility] eval around 2015    Essential (primary) hypertension 06/11/2015   Change from ACE inhibitor to ARB 03/16/2019 due to unexplained dry cough and chest tightness > completely resolved as of 05/23/2019    Ground glass opacity present on imaging of lung 01/02/2021   History of kidney stones    History of partial colectomy 12/18/2019   History of prosthetic unicompartmental arthroplasty of right knee 03/02/2016   Hyperglycemia 07/04/2020   Hypertension    Insomnia 12/18/2019   Localized, primary osteoarthritis of hand 02/03/2021   Mass 05/04/2017   Nasal sore 12/18/2019   Nuclear sclerotic cataract of left eye 04/21/2016   Other chronic pain 03/12/2020   Other paralytic strabismus, 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   Overweight 02/03/2021   Pain in joint of right hip 09/22/2018   Pain in limb 02/03/2021  Pain in right knee 02/03/2021   Palpitations 06/11/2015   Presence of right artificial knee joint 03/02/2016   Primary osteoarthritis 02/03/2021   Pulmonary emboli ([place]) 05/24/2019     CTa 02/23/19 pos for PE 02/23/19  Venous dopplers not done, echo ok    RA (rheumatoid arthritis) ([place])    Rheumatoid arthritis ([place]) 02/03/2021   S/P right unicompartmental knee replacement    06-20-2015  post dislocating plastic    Seronegative rheumatoid arthritis ([place]) 12/18/2019   Status post right unicompartmental knee replacement 03/03/2016   Transient  neurological symptoms 03/12/2020   Urticaria 02/03/2021   Vitamin D deficiency 02/03/2021   Past Surgical History:  Procedure Laterality Date   CARDIAC CATHETERIZATION  Feb 2015    [place] [facility]   per pt normal coronary arteries   CATARACT EXTRACTION W/ INTRAOCULAR LENS IMPLANT Right Mar 2014   CYSTOSCOPY W/ URETEROSCOPY W/ LITHOTRIPSY     EXCISION SUBDERMAL NECK TUMOR  2010   benign   KNEE ARTHROSCOPY W/ MENISCECTOMY Right 01-09-2015   LEFT ELBOW RECONSTRUCTION  2010   PARTIAL KNEE ARTHROPLASTY Right 08/08/2015   Procedure: RIGHT UNICOMPARTMENTAL KNEE REVISION PLASTIC;  Surgeon: Paralee Cancel, MD;  Location: [facility];  Service: Orthopedics;  Laterality: Right;   PARTIAL KNEE ARTHROPLASTY Right 03/02/2016   Procedure: UNICOMPARTMENTAL RIGHT KNEE REVISION;  Surgeon: Paralee Cancel, MD;  Location: [facility];  Service: Orthopedics;  Laterality: Right;   REPLACEMENT UNICONDYLAR JOINT KNEE Right 06-20-2015   RIGHT/LEFT HEART CATH AND CORONARY ANGIOGRAPHY N/A 01/30/2021   Procedure: RIGHT/LEFT HEART CATH AND CORONARY ANGIOGRAPHY;  Surgeon: Belva Crome, MD;  Location: [facility];  Service: Cardiovascular;  Laterality: N/A;   STRABISMUS SURGERY Left x6   last one --Age 29     Current Outpatient Medications  Medication Sig Dispense Refill   albuterol (VENTOLIN HFA) 108 (90 Base) MCG/ACT inhaler Inhale 2 puffs into the lungs every 6 (six) hours as needed for wheezing or shortness of breath. 8 g 0   aspirin EC 81 MG tablet Take 1 tablet (81 mg total) by mouth daily. Swallow whole. 90 tablet 3   atorvastatin (LIPITOR) 10 MG tablet Take 10 mg by mouth daily.     Certolizumab Pegol (CIMZIA STARTER KIT [place]) Inject 2 Syringes into the skin every 28 (twenty-eight) days.     DULoxetine (CYMBALTA) 60 MG capsule Take 60 mg by mouth daily.     ergocalciferol (VITAMIN D2) 1.25 MG (50000 UT) capsule Take 50,000 Units by mouth every Friday.     flecainide (TAMBOCOR) 50 MG tablet Take 1 tablet  (50 mg total) by mouth 2 (two) times daily. 180 tablet 3   Glucosamine-Chondroit-Vit C-Mn (GLUCOSAMINE 1500 COMPLEX) CAPS Take 1 capsule by mouth daily.     ibuprofen (ADVIL) 200 MG tablet Take 200 mg by mouth as needed for pain.     leflunomide (ARAVA) 20 MG tablet Take 20 mg by mouth daily.     loratadine-pseudoephedrine (CLARITIN-D 24-HOUR) 10-240 MG 24 hr tablet Take 1 tablet by mouth daily as needed for allergies.     metoprolol succinate (TOPROL-XL) 100 MG 24 hr tablet Take 1 tablet (100 mg total) by mouth daily. Take with or immediately following a meal. 90 tablet 2   Multiple Vitamin (MULTIVITAMIN WITH MINERALS) TABS tablet Take 1 tablet by mouth in the morning.     mupirocin ointment (BACTROBAN) 2 % Apply 1 application topically 2 (two) times daily as needed for rash (skin irritation/wounds).  pantoprazole (PROTONIX) 40 MG tablet Take 40 mg by mouth daily.     predniSONE (DELTASONE) 5 MG tablet Take 5 mg by mouth in the morning.     RESTASIS 0.05 % ophthalmic emulsion Place 1 drop into both eyes 2 (two) times daily.     tadalafil (CIALIS) 20 MG tablet Take 0.5-1 tablets (10-20 mg total) by mouth every other day as needed for erectile dysfunction. 10 tablet 11   telmisartan-hydrochlorothiazide (MICARDIS HCT) 80-25 MG tablet Take 1 tablet by mouth daily. 90 tablet 3   temazepam (RESTORIL) 15 MG capsule TAKE 1 CAPSULE AT BEDTIME  AS NEEDED FOR SLEEP 90 capsule 1   No current facility-administered medications for this visit.    Allergies:   Dilaudid [hydromorphone], Hydroxychloroquine sulfate, Morphine, Hydroxychloroquine, and Infliximab   Social History:  The patient  reports that he quit smoking about 26 years ago. His smoking use included cigarettes. He has a 5.00 pack-year smoking history. He has never used smokeless tobacco. He reports that he does not drink alcohol and does not use drugs.   Family History:  The patient's family history includes Congestive Heart Failure in his  father and mother.    ROS:  Please see the history of present illness.   Otherwise, review of systems is positive for none.   All other systems are reviewed and negative.    PHYSICAL EXAM: VS:  BP 118/80   Pulse 60   Ht 5' 11"  (1.803 m)   Wt 182 lb (82.6 kg)   BMI 25.38 kg/m  , BMI Body mass index is 25.38 kg/m. GEN: Well nourished, well developed, in no acute distress  HEENT: normal  Neck: no JVD, carotid bruits, or masses Cardiac: RRR; no murmurs, rubs, or gallops,no edema  Respiratory:  clear to auscultation bilaterally, normal work of breathing GI: soft, nontender, nondistended, + BS MS: no deformity or atrophy  Skin: warm and dry Neuro:  Strength and sensation are intact Psych: euthymic mood, full affect  EKG:  EKG is ordered today. Personal review of the ekg ordered shows sinus rhythm, rate 60  Recent Labs: 01/02/2021: ALT 14; TSH 1.32 01/24/2021: BUN 20; Creatinine, Ser 1.37; Platelets 218 01/30/2021: Hemoglobin 9.9; Potassium 3.1; Sodium 134    Lipid Panel     Component Value Date/Time   CHOL 170 01/02/2021 1019   TRIG 170.0 (H) 01/02/2021 1019   HDL 52.20 01/02/2021 1019   CHOLHDL 3 01/02/2021 1019   VLDL 34.0 01/02/2021 1019   LDLCALC 83 01/02/2021 1019   LDLDIRECT 71.0 12/18/2019 1524     Wt Readings from Last 3 Encounters:  03/11/21 182 lb (82.6 kg)  02/21/21 183 lb 3.2 oz (83.1 kg)  02/20/21 182 lb (82.6 kg)      Other studies Reviewed: Additional studies/ records that were reviewed today include: TTE 01/22/21  Review of the above records today demonstrates:   1. Left ventricular ejection fraction, by estimation, is 60 to 65%. The  left ventricle has normal function. The left ventricle has no regional  wall motion abnormalities. Left ventricular diastolic parameters are  indeterminate.   2. Right ventricular systolic function is normal. The right ventricular  size is normal. There is normal pulmonary artery systolic pressure.   3. The mitral  valve is normal in structure. No evidence of mitral valve  regurgitation. No evidence of mitral stenosis.   4. The aortic valve is normal in structure. Aortic valve regurgitation is  mild. No aortic stenosis is present.   5.  Aneurysm of the ascending aorta, measuring 44 mm.   6. The inferior vena cava is normal in size with greater than 50%  respiratory variability, suggesting right atrial pressure of 3 mmHg.   Right and left heart catheterization 01/30/2021 Right dominant normal coronary arteries. Normal left ventricular systolic function with EF greater than 55%.  LVEDP 14 mmHg. Normal right heart pressures with mean wedge pressure 11 mmHg.  Cardiac monitor 02/11/2021 personally reviewed 1.  1 episode of nonsustained ventricular tachycardia. 2.  Paroxysmal supraventricular tachycardia which is likely atrial tachycardia with variable block.  ASSESSMENT AND PLAN:  1.  SVT: Appears due to an atrial tachycardia.  Currently on metoprolol and flecainide.  High risk medication monitoring.  His symptoms have occurred during both SVT and sinus rhythm.  I think that most of his symptoms are not related to cardiac issues, as he has been in both sinus rhythm and sinus tachycardia with heart rates in the 90s to low 100s during the majority of his symptoms.  I Tessie Ordaz increase his Toprol-XL to 100 mg.  Aside from that, I Simcha Farrington see him back as needed.  He has pulmonary follow-up next week.  2.  Tobacco abuse: Complete cessation encouraged  3.  Nonsustained VT: Short episodes.  Continue with current management.  Case discussed with primary cardiology  Current medicines are reviewed at length with the patient today.   The patient does not have concerns regarding his medicines.  The following changes were made today: Increase Toprol-XL  Labs/ tests ordered today include:  Orders Placed This Encounter  Procedures   EKG 12-Lead      Disposition:   FU with Lynora Dymond as needed  Signed, Kelseigh Diver Meredith Leeds, MD  03/11/2021 10:41 AM     [facility] [address] [place] [address] [phone] (office) [phone] (fax)

## 2021-03-12 ENCOUNTER — Ambulatory Visit: Payer: Medicare Other | Admitting: Cardiology

## 2021-03-14 ENCOUNTER — Other Ambulatory Visit: Payer: Self-pay

## 2021-03-14 ENCOUNTER — Ambulatory Visit (INDEPENDENT_AMBULATORY_CARE_PROVIDER_SITE_OTHER): Payer: Medicare Other | Admitting: Internal Medicine

## 2021-03-14 ENCOUNTER — Encounter: Payer: Self-pay | Admitting: Internal Medicine

## 2021-03-14 DIAGNOSIS — R0600 Dyspnea, unspecified: Secondary | ICD-10-CM | POA: Diagnosis not present

## 2021-03-14 DIAGNOSIS — R0609 Other forms of dyspnea: Secondary | ICD-10-CM

## 2021-03-14 LAB — CBC WITH DIFFERENTIAL/PLATELET
Basophils Absolute: 0.2 10*3/uL — ABNORMAL HIGH (ref 0.0–0.1)
Basophils Relative: 2.6 % (ref 0.0–3.0)
Eosinophils Absolute: 0.3 10*3/uL (ref 0.0–0.7)
Eosinophils Relative: 5.4 % — ABNORMAL HIGH (ref 0.0–5.0)
HCT: 39.6 % (ref 39.0–52.0)
Hemoglobin: 13.5 g/dL (ref 13.0–17.0)
Lymphocytes Relative: 27.8 % (ref 12.0–46.0)
Lymphs Abs: 1.6 10*3/uL (ref 0.7–4.0)
MCHC: 34.1 g/dL (ref 30.0–36.0)
MCV: 96.8 fl (ref 78.0–100.0)
Monocytes Absolute: 1 10*3/uL (ref 0.1–1.0)
Monocytes Relative: 17 % — ABNORMAL HIGH (ref 3.0–12.0)
Neutro Abs: 2.7 10*3/uL (ref 1.4–7.7)
Neutrophils Relative %: 47.2 % (ref 43.0–77.0)
Platelets: 257 10*3/uL (ref 150.0–400.0)
RBC: 4.09 Mil/uL — ABNORMAL LOW (ref 4.22–5.81)
RDW: 13.4 % (ref 11.5–15.5)
WBC: 5.8 10*3/uL (ref 4.0–10.5)

## 2021-03-14 LAB — TSH: TSH: 1.05 u[IU]/mL (ref 0.35–5.50)

## 2021-03-14 LAB — SEDIMENTATION RATE: Sed Rate: 5 mm/hr (ref 0–20)

## 2021-03-14 MED ORDER — FAMOTIDINE 20 MG PO TABS: ORAL_TABLET | ORAL | 11 refills | Status: DC

## 2021-03-14 NOTE — Patient Instructions (Addendum)
Pantoprazole (protonix) 40 mg   Take  30-60 min before first meal of the day and Pepcid (famotidine)  20 mg after supper until return to office - this is the best way to tell whether stomach acid is contributing to your problem.    GERD (REFLUX)  is an extremely common cause of respiratory symptoms just like yours , many times with no obvious heartburn at all.    It can be treated with medication, but also with lifestyle changes including elevation of the head of your bed (ideally with 6 -8inch blocks under the headboard of your bed),  Smoking cessation, avoidance of late meals, excessive alcohol, and avoid fatty foods, chocolate, peppermint, colas, red wine, and acidic juices such as orange juice.  NO MINT OR MENTHOL PRODUCTS SO NO COUGH DROPS  USE SUGARLESS CANDY INSTEAD (Jolley ranchers or Stover's or Life Savers) or even ice chips will also do - the key is to swallow to prevent all throat clearing. NO OIL BASED VITAMINS - use powdered substitutes.  Avoid fish oil when coughing.   Please remember to go to the lab department   for your tests - we will call you with the results when they are available.  To get the most out of exercise, you need to be continuously aware that you are short of breath, but never out of breath, for at least 30 minutes daily. As you improve, it will actually be easier for you to do the same amount of exercise  in  30 minutes so always push to the level where you are short of breath.     Make sure you check your oxygen saturations at highest level of activity  Please schedule a follow up office visit in 4-6  weeks, sooner if needed

## 2021-03-14 NOTE — Progress Notes (Signed)
Luis Wilkinson, male    DOB: 09-01-1955,   MRN: [id number]   Brief patient profile:  56 yowm quit smoking 1995  Asthma as child in elementary school > er sev times mostly outgrew and did fine after that with no limitations but 2012 bad arthritis in hands dx as RA neg rheumatism on remicade/ daily prednisone Luis Wilkinson)   then early June 2020 onset doe progressively worse to point of resting sob/ chest tightness > Luis Wilkinson primary >  CTa 02/23/19 pos for PE 02/23/19   Neg cards 03/31/18 [facility]    History of Present Illness  03/16/2019  Pulmonary/ 1st office eval/Luis Wilkinson  Chief Complaint  Patient presents with   Pulmonary Consult    Referred by Dr. Sheppard Coil for sob with exertion and abnormal CT scan. Patient reports that he has sob with exertion and any long distance walking.   Dyspnea:  Chest feels heavy at rest and p 50-100 ft worse assoc with sob no better since started eliquis  Cough: dry cough since onset of doe early June 2020 Sleep: fine lying down flat  SABA use: none rec Increase atenolol to where you take 100 mg in am and 50 mg (one half of 100) in pm  Change pantoprazole to 40 mg Take 30- 60 min before your first and last meals of the day  GERD diet   Stop lisinopril  Start Micardis 80-25 one daily in place of lisinopril      03/30/2019  f/u ov/Luis Wilkinson re: unexplained sob p PE  Chief Complaint  Patient presents with   Follow-up    Patient reports that he is doing about the same since last visit with mild improvement.   Dyspnea:  Can't keep up with gdaughter who is 6 riding a bike / had been limited by "bursa"  R hip  X 8 months prior to PE but never had the legs looked at for dvt  Cough: none Sleeping: able to lie flat SABA use: no 02: no rec To get the most out of exercise, you need to be continuously aware that you are short of breath, but never out of breath, for 30 minutes daily. As you improve, it will actually be easier for you to do the same amount of exercise  in   30 minutes so always push to the level where you are short of breath.       05/23/2019  f/u ov/Luis Wilkinson re: s/p PE with improving doe  Chief Complaint  Patient presents with   Follow-up    Breathing has improved back to his normal baseline and no new co's.   Dyspnea:  Not limited by breathing from desired activities   Cough: none Sleeping: fine flat SABA use: none 02: none  Rec I recommend a minimum of 6 months of therapy for pulmonary embolism   >>>  After 6 months would do venous dopplers first to be sure there is no ongoing leg clot and if negative, then ok to stop the eliquis and review/ complete your work up for hypercoagulability and if all neg then either medium dose eliquis  prevent clots or one aspirin a day Dr Sheppard Coil and leave up to him as to whether to send you to a blood clotting specialist due to your mother's history.    01/30/21 nl RHC / LHC nl   03/14/2021  f/u ov/Luis Wilkinson re:  doe p PE   RA on pred 5 mg  Chief Complaint  Patient presents with  Follow-up    Dyspnea still having sob on exertion. Inhaler not making it better.  Dyspnea:  one aisle [employer] ever since covid no variability  Cough: none  Sleeping: no resp symptoms  SABA use: no better p rx  02: none  Covid status:   still never vax  On ppi daily with bfast    No obvious day to day or daytime variability or assoc excess/ purulent sputum or mucus plugs or hemoptysis or cp or chest tightness, subjective wheeze or overt sinus or hb symptoms.   Sleeping as above  without nocturnal  or early am exacerbation  of respiratory  c/o's or need for noct saba. Also denies any obvious fluctuation of symptoms with weather or environmental changes or other aggravating or alleviating factors except as outlined above   No unusual exposure hx or h/o childhood pna  or knowledge of premature birth.  Current Allergies, Complete Past Medical History, Past Surgical History, Family History, and Social History were reviewed in  [employer] record.  ROS  The following are not active complaints unless bolded Hoarseness, sore throat, dysphagia, dental problems, itching, sneezing,  nasal congestion or discharge of excess mucus or purulent secretions, ear ache,   fever, chills, sweats, unintended wt loss or wt gain, classically pleuritic or exertional cp,  orthopnea pnd or arm/hand swelling  or leg swelling, presyncope, palpitations, abdominal pain, anorexia, nausea, vomiting, diarrhea  or change in bowel habits or change in bladder habits, change in stools or change in urine, dysuria, hematuria,  rash, arthralgias, visual complaints, headache, numbness, weakness or ataxia or problems with walking or coordination,  change in mood or  memory.        Current Meds  Medication Sig   albuterol (VENTOLIN HFA) 108 (90 Base) MCG/ACT inhaler Inhale 2 puffs into the lungs every 6 (six) hours as needed for wheezing or shortness of breath.   aspirin EC 81 MG tablet Take 1 tablet (81 mg total) by mouth daily. Swallow whole.   atorvastatin (LIPITOR) 10 MG tablet Take 10 mg by mouth daily.   Certolizumab Pegol (CIMZIA STARTER KIT [name]) Inject 2 Syringes into the skin every 28 (twenty-eight) days.   DULoxetine (CYMBALTA) 60 MG capsule Take 60 mg by mouth daily.   ergocalciferol (VITAMIN D2) 1.25 MG (50000 UT) capsule Take 50,000 Units by mouth every Friday.   flecainide (TAMBOCOR) 50 MG tablet Take 1 tablet (50 mg total) by mouth 2 (two) times daily.   Glucosamine-Chondroit-Vit C-Mn (GLUCOSAMINE 1500 COMPLEX) CAPS Take 1 capsule by mouth daily.   ibuprofen (ADVIL) 200 MG tablet Take 200 mg by mouth as needed for pain.   leflunomide (ARAVA) 20 MG tablet Take 20 mg by mouth daily.   loratadine-pseudoephedrine (CLARITIN-D 24-HOUR) 10-240 MG 24 hr tablet Take 1 tablet by mouth daily as needed for allergies.   metoprolol succinate (TOPROL-XL) 100 MG 24 hr tablet Take 1 tablet (100 mg total) by mouth daily. Take with or  immediately following a meal.   Multiple Vitamin (MULTIVITAMIN WITH MINERALS) TABS tablet Take 1 tablet by mouth in the morning.   mupirocin ointment (BACTROBAN) 2 % Apply 1 application topically 2 (two) times daily as needed for rash (skin irritation/wounds).   pantoprazole (PROTONIX) 40 MG tablet Take 40 mg by mouth daily.   predniSONE (DELTASONE) 5 MG tablet Take 5 mg by mouth in the morning.   RESTASIS 0.05 % ophthalmic emulsion Place 1 drop into both eyes 2 (two) times daily.  tadalafil (CIALIS) 20 MG tablet Take 0.5-1 tablets (10-20 mg total) by mouth every other day as needed for erectile dysfunction.   telmisartan-hydrochlorothiazide (MICARDIS HCT) 80-25 MG tablet Take 1 tablet by mouth daily.   temazepam (RESTORIL) 15 MG capsule TAKE 1 CAPSULE AT BEDTIME  AS NEEDED FOR SLEEP                      Past Medical History:  Diagnosis Date   Aneurysm, ascending aorta ([place])    followed br Dr Hortencia Pilar   History of kidney stones    Hypertension    RA (rheumatoid arthritis) ([place])    S/P right unicompartmental knee replacement    06-20-2015  post dislocating plastic       Objective:    03/14/2021         181   05/23/2019    189   03/30/19 192 lb 6.4 oz (87.3 kg)  03/16/19 189 lb (85.7 kg)  01/12/19 182 lb (82.6 kg)     Vital signs reviewed  03/14/2021  - Note at rest 02 sats  95% on RA   General appearance:    amb wm nad/ freq throat clearing    HEENT : pt wearing mask not removed for exam due to covid -19 concerns.    NECK :  without JVD/Nodes/TM/ nl carotid upstrokes bilaterally   LUNGS: no acc muscle use,  Nl contour chest which is clear to A and P bilaterally without cough on insp or exp maneuvers   CV:  RRR  no s3 or murmur or increase in P2, and no edema   ABD:  soft and nontender with nl inspiratory excursion in the supine position. No bruits or organomegaly appreciated, bowel sounds nl  MS:  Nl gait/ ext warm without deformities, calf tenderness, cyanosis or  clubbing No obvious joint restrictions   SKIN: warm and dry without lesions    NEURO:  alert, approp, nl sensorium with  no motor or cerebellar deficits apparent.     I personally reviewed images and agree with radiology impression as follows:   Chest CTa 01/16/21  Unchanged ground-glass pulmonary nodule in the anterior lingula measuring up to 0.7 cm. No new suspicious pulmonary nodules. No focal consolidations. No evidence of pleural effusion or pneumothorax.    Labs ordered/ reviewed:      Chemistry      Component Value Date/Time   NA 134 (L) 01/30/2021 0809   NA 141 01/24/2021 0940   K 3.1 (L) 01/30/2021 0809   CL 100 01/24/2021 0940   CO2 26 01/24/2021 0940   BUN 20 01/24/2021 0940   CREATININE 1.37 (H) 01/24/2021 0940      Component Value Date/Time   CALCIUM 9.4 01/24/2021 0940   ALKPHOS 58 01/02/2021 1019   AST 21 01/02/2021 1019   ALT 14 01/02/2021 1019   BILITOT 1.2 01/02/2021 1019        Lab Results  Component Value Date   WBC 5.8 03/14/2021   HGB 13.5 03/14/2021   HCT 39.6 03/14/2021   MCV 96.8 03/14/2021   PLT 257.0 03/14/2021     Lab Results  Component Value Date   DDIMER 0.40 03/14/2021      Lab Results  Component Value Date   TSH 1.05 03/14/2021     Lab Results  Component Value Date   PROBNP 100.0 03/16/2019       Lab Results  Component Value Date   ESRSEDRATE 5 03/14/2021  ESRSEDRATE 2 03/16/2019                  Assessment

## 2021-03-14 NOTE — Assessment & Plan Note (Addendum)
Onset early June 2020 with assoc chest tightness  - CTa 02/23/19 several segmental and subsegmental PE with micronodular lung dz ? Etiology with no venous dopplers  - 03/16/2019   Walked RA  2 laps @  approx 269f each @ nl pace  stopped due to  End of study with some sob and chest tightness with sats 98%   - trial off acei and max rx for gerd 03/16/2019  - Echo 03/21/2019   No PH  - 03/30/2019   Walked RA  3 laps @  approx 2538feach @ fast pace  stopped due to end of study, mild sob but sats 99%  01/30/21 nl RHC / LHC nl  -  03/14/2021   Walked RA  2 laps @ approx 25084fach @ moderate to fast  pace  stopped due to end of study, , no sob and sats 94% at end     Symptoms are markedly disproportionate to objective findings and not clear to what extent this is actually a pulmonary  problem but pt does appear to have difficult to sort out respiratory symptoms of unknown origin for which  DDX  = almost all start with A and  include Adherence, Ace Inhibitors, Acid Reflux, Active Sinus Disease, Alpha 1 Antitripsin deficiency, Anxiety masquerading as Airways dz,  ABPA,  Allergy(esp in young), Aspiration (esp in elderly), Adverse effects of meds,  Active smoking or Vaping, A bunch of PE's/clot burden (a few small clots can't cause this syndrome unless there is already severe underlying pulm or vascular dz with poor reserve),  Anemia or thyroid disorder, plus two Bs  = Bronchiectasis and Beta blocker use..and one C= CHF     Adherence is always the initial "prime suspect" and is a multilayered concern that requires a "trust but verify" approach in every patient - starting with knowing how to use medications, especially inhalers, correctly, keeping up with refills and understanding the fundamental difference between maintenance and prns vs those medications only taken for a very short course and then stopped and not refilled.   ? Acid (or non-acid) GERD > always difficult to exclude as up to 75% of pts in some series  report no assoc GI/ Heartburn symptoms> rec max (24h)  acid suppression and diet restrictions/ reviewed and instructions given in writing.   ? Allergy/ asthma > no cough or noct symptoms, check IgE to be complete   ? Anxiety/depression deconditioning  > usually at the bottom of this list of usual suspects but should be much higher on this pt's based on H and P and note already on psychotropics and may interfere with adherence and also interpretation of response or lack thereof to symptom management which can be quite subjective.  >>> reconditioning reviewed  ? Adverse drug effects > none of the usual suspects listed  ? A  Bunch of PEs'  >  D dimer nl - while a normal  or high normal value (seen commonly in the elderly or chronically ill)  may miss small peripheral pe, the clot burden with sob is moderately high and the d dimer  has a very high neg pred value if used in this setting.    BB effects > no evidence of asthma so ok to continue toprol at 100 mg daily   ? chf > see heart cath including nl R ht pressures so PH ruled out    Next step is cpst > try to recondition first   F/u in 6 weeks  Each maintenance medication was reviewed in detail including emphasizing most importantly the difference between maintenance and prns and under what circumstances the prns are to be triggered using an action plan format where appropriate.  Total time for H and P, chart review, counseling,  directly observing portions of ambulatory 02 saturation study/ and generating customized AVS unique to this office visit with pt not seen in almost 2 y / same day charting  > 40 min

## 2021-03-17 ENCOUNTER — Encounter: Payer: Self-pay | Admitting: *Deleted

## 2021-03-17 LAB — D-DIMER, QUANTITATIVE: D-Dimer, Quant: 0.4 mcg/mL FEU (ref <0.50)

## 2021-03-17 LAB — IGE: IgE (Immunoglobulin E), Serum: 47 kU/L (ref <=114)

## 2021-03-18 ENCOUNTER — Ambulatory Visit (INDEPENDENT_AMBULATORY_CARE_PROVIDER_SITE_OTHER): Payer: Medicare Other | Admitting: Cardiology

## 2021-03-18 ENCOUNTER — Other Ambulatory Visit: Payer: Self-pay

## 2021-03-18 ENCOUNTER — Encounter: Payer: Self-pay | Admitting: Cardiology

## 2021-03-18 ENCOUNTER — Ambulatory Visit: Payer: Medicare Other | Admitting: Cardiology

## 2021-03-18 VITALS — BP 120/84 | HR 70 | Ht 70.0 in | Wt 183.0 lb

## 2021-03-18 DIAGNOSIS — I712 Thoracic aortic aneurysm, without rupture: Secondary | ICD-10-CM

## 2021-03-18 DIAGNOSIS — R0602 Shortness of breath: Secondary | ICD-10-CM

## 2021-03-18 DIAGNOSIS — I472 Ventricular tachycardia: Secondary | ICD-10-CM

## 2021-03-18 DIAGNOSIS — I7121 Aneurysm of the ascending aorta, without rupture: Secondary | ICD-10-CM

## 2021-03-18 DIAGNOSIS — I471 Supraventricular tachycardia: Secondary | ICD-10-CM | POA: Diagnosis not present

## 2021-03-18 DIAGNOSIS — I10 Essential (primary) hypertension: Secondary | ICD-10-CM

## 2021-03-18 DIAGNOSIS — I4729 Other ventricular tachycardia: Secondary | ICD-10-CM

## 2021-03-18 MED ORDER — FUROSEMIDE 40 MG PO TABS: 40.0000 mg | ORAL_TABLET | ORAL | 0 refills | Status: DC

## 2021-03-18 NOTE — Progress Notes (Signed)
Cardiology Office Note:    Date:  03/18/2021   ID:  Luis Wilkinson, DOB 08-25-1955, MRN [id number]  PCP:  Biagio Borg, MD  Cardiologist:  Berniece Salines, DO  Electrophysiologist:  None   Referring MD: Biagio Borg, MD   " I am still short of breath"  History of Present Illness:    Luis Wilkinson is a 65 y.o. male with a hx of arthritis, current smoker, recent heart catheterization (right and left heart catheterization) with no evidence of coronary artery disease and normal right-sided pressures, ascending aortic aneurysm 43 mm on imaging done on January 16, 2021 reported to be unchanged from his previous study which was done in December 2021     I did see the patient back in December for a operative clearance for hand surgery.  We will repeat an echocardiogram and a CTA given his history of ascending thoracic aneurysm.  I also referred the patient to vascular surgery.  He was intermittently cleared for his hand surgery.  He was able to get his hand surgery.  He also has seen vascular surgeon.   I saw the patient in March 2022 at that time he appeared to be doing well from a cardiovascular standpoint no changes were made to his medication regimen.  He did have some shortness of breath for recommended patient get a PFT to make sure secondary lung pathology was not playing a role as his echocardiogram was normal.   He was seen on January 30, 2021 giving the worsening of symptoms are very out of proportion with his shortness of breath I recommended patient undergo left and right heart catheterization.  At that time I also place a ZIO monitor on the patient.   For the last saw the patient he is seen EP and his metoprolol was increased to 100 mg daily.  He also has seen pulmonary.  It has been noticed that his symptoms are markedly disproportionate to the objective findings.  Which I agree with as well.  During that visit discussion with the pulmonary doctor included optimizing care for GERD.  Unfortunately  the patient has not had the opportunity to schedule his PFT as the testing center had not reach out to him yet.  He is still short of breath.  He also showed me information about his heart rate which seems to be improving.  No other complaints at this time.  No chest pain.   Past Medical History:  Diagnosis Date   Acetabular labrum tear 10/10/2019   Acute sinusitis 02/22/2020   Allergic rhinitis 12/18/2019   Anemia 07/03/2020   Aneurysm, ascending aorta (HCC)    followed br Dr Hortencia Pilar   Ascending aortic aneurysm ([place]) 06/11/2015   Body mass index (BMI) 27.0-27.9, adult 03/11/2020   Chest pain of uncertain etiology 4/74/2595   Chronic low back pain 02/22/2020   Colonic mass 10/26/2019   DOE (dyspnea on exertion) 03/16/2019   Onset early June 2020 with assoc chest tightness  - CTa 02/23/19 several segmental and subsegmental PE with micronodular lung dz ? Etiology with no venous dopplers  - 03/16/2019   Walked RA  2 laps @  approx 223f each @ nl pace  stopped due to  End of study with some sob and chest tightness with sats 98%   - trial off acei and max rx for gerd 03/16/2019  - Echo 03/21/2019   No PH  - 03/30/2019   Walked    Droopy eyelid, left 12/18/2019   Dysphagia  01/02/2021   Eosinophilia 03/31/2019   Onset of asthma as child / [facility] eval around 2015    Essential (primary) hypertension 06/11/2015   Change from ACE inhibitor to ARB 03/16/2019 due to unexplained dry cough and chest tightness > completely resolved as of 05/23/2019    Ground glass opacity present on imaging of lung 01/02/2021   History of kidney stones    History of partial colectomy 12/18/2019   History of prosthetic unicompartmental arthroplasty of right knee 03/02/2016   Hyperglycemia 07/04/2020   Hypertension    Insomnia 12/18/2019   Localized, primary osteoarthritis of hand 02/03/2021   Mass 05/04/2017   Nasal sore 12/18/2019   Nuclear sclerotic cataract of left eye 04/21/2016   Other chronic pain 03/12/2020   Other paralytic  strabismus, 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   Overweight 02/03/2021   Pain in joint of right hip 09/22/2018   Pain in limb 02/03/2021   Pain in right knee 02/03/2021   Palpitations 06/11/2015   Presence of right artificial knee joint 03/02/2016   Primary osteoarthritis 02/03/2021   Pulmonary emboli ([place]) 05/24/2019     CTa 02/23/19 pos for PE 02/23/19  Venous dopplers not done, echo ok    RA (rheumatoid arthritis) ([place])    Rheumatoid arthritis ([place]) 02/03/2021   S/P right unicompartmental knee replacement    06-20-2015  post dislocating plastic    Seronegative rheumatoid arthritis ([place]) 12/18/2019   Status post right unicompartmental knee replacement 03/03/2016   Transient neurological symptoms 03/12/2020   Urticaria 02/03/2021   Vitamin D deficiency 02/03/2021    Past Surgical History:  Procedure Laterality Date   CARDIAC CATHETERIZATION  Feb 2015    [place]   per pt normal coronary arteries   CATARACT EXTRACTION W/ INTRAOCULAR LENS IMPLANT Right Mar 2014   CYSTOSCOPY W/ URETEROSCOPY W/ LITHOTRIPSY     EXCISION SUBDERMAL NECK TUMOR  2010   benign   KNEE ARTHROSCOPY W/ MENISCECTOMY Right 01-09-2015   LEFT ELBOW RECONSTRUCTION  2010   PARTIAL KNEE ARTHROPLASTY Right 08/08/2015   Procedure: RIGHT UNICOMPARTMENTAL KNEE REVISION PLASTIC;  Surgeon: Paralee Cancel, MD;  Location: [facility];  Service: Orthopedics;  Laterality: Right;   PARTIAL KNEE ARTHROPLASTY Right 03/02/2016   Procedure: UNICOMPARTMENTAL RIGHT KNEE REVISION;  Surgeon: Paralee Cancel, MD;  Location: [facility];  Service: Orthopedics;  Laterality: Right;   REPLACEMENT UNICONDYLAR JOINT KNEE Right 06-20-2015   RIGHT/LEFT HEART CATH AND CORONARY ANGIOGRAPHY N/A 01/30/2021   Procedure: RIGHT/LEFT HEART CATH AND CORONARY ANGIOGRAPHY;  Surgeon: Belva Crome, MD;  Location: [facility];  Service: Cardiovascular;  Laterality: N/A;   STRABISMUS SURGERY Left x6   last one --Age 75    Current Medications: Current Meds  Medication  Sig   albuterol (VENTOLIN HFA) 108 (90 Base) MCG/ACT inhaler Inhale 2 puffs into the lungs every 6 (six) hours as needed for wheezing or shortness of breath.   aspirin EC 81 MG tablet Take 1 tablet (81 mg total) by mouth daily. Swallow whole.   atorvastatin (LIPITOR) 10 MG tablet Take 10 mg by mouth daily.   Certolizumab Pegol (CIMZIA STARTER KIT [place]) Inject 2 Syringes into the skin every 28 (twenty-eight) days.   DULoxetine (CYMBALTA) 60 MG capsule Take 60 mg by mouth daily.   ergocalciferol (VITAMIN D2) 1.25 MG (50000 UT) capsule Take 50,000 Units by mouth every Friday.   famotidine (PEPCID) 20 MG tablet Take 20 mg by mouth daily. After supper   flecainide (TAMBOCOR) 50 MG tablet Take  1 tablet (50 mg total) by mouth 2 (two) times daily.   furosemide (LASIX) 40 MG tablet Take 1 tablet (40 mg total) by mouth every other day.   Glucosamine-Chondroit-Vit C-Mn (GLUCOSAMINE 1500 COMPLEX) CAPS Take 1 capsule by mouth daily.   ibuprofen (ADVIL) 200 MG tablet Take 200 mg by mouth as needed for pain.   leflunomide (ARAVA) 20 MG tablet Take 20 mg by mouth daily.   loratadine-pseudoephedrine (CLARITIN-D 24-HOUR) 10-240 MG 24 hr tablet Take 1 tablet by mouth daily as needed for allergies.   metoprolol succinate (TOPROL-XL) 100 MG 24 hr tablet Take 1 tablet (100 mg total) by mouth daily. Take with or immediately following a meal.   Multiple Vitamin (MULTIVITAMIN WITH MINERALS) TABS tablet Take 1 tablet by mouth in the morning.   mupirocin ointment (BACTROBAN) 2 % Apply 1 application topically 2 (two) times daily as needed for rash (skin irritation/wounds).   pantoprazole (PROTONIX) 40 MG tablet Take 40 mg by mouth daily.   predniSONE (DELTASONE) 5 MG tablet Take 5 mg by mouth in the morning.   RESTASIS 0.05 % ophthalmic emulsion Place 1 drop into both eyes 2 (two) times daily.   tadalafil (CIALIS) 20 MG tablet Take 0.5-1 tablets (10-20 mg total) by mouth every other day as needed for erectile dysfunction.    telmisartan-hydrochlorothiazide (MICARDIS HCT) 80-25 MG tablet Take 1 tablet by mouth daily.   temazepam (RESTORIL) 15 MG capsule TAKE 1 CAPSULE AT BEDTIME  AS NEEDED FOR SLEEP     Allergies:   Dilaudid [hydromorphone], Hydroxychloroquine sulfate, Morphine, Hydroxychloroquine, and Infliximab   Social History   Socioeconomic History   Marital status: Married    Spouse name: Not on file   Number of children: Not on file   Years of education: Not on file   Highest education level: Not on file  Occupational History   Not on file  Tobacco Use   Smoking status: Former    Packs/day: 0.50    Years: 10.00    Pack years: 5.00    Types: Cigarettes    Start date: 37    Quit date: 07/19/1994    Years since quitting: 26.6   Smokeless tobacco: Never  Substance and Sexual Activity   Alcohol use: No   Drug use: No   Sexual activity: Not on file  Other Topics Concern   Not on file  Social History Narrative   Not on file   Social Determinants of Health   Financial Resource Strain: Not on file  Food Insecurity: Not on file  Transportation Needs: Not on file  Physical Activity: Not on file  Stress: Not on file  Social Connections: Not on file     Family History: The patient's family history includes Congestive Heart Failure in his father and mother.  ROS:   Review of Systems  Constitution: Negative for decreased appetite, fever and weight gain.  HENT: Negative for congestion, ear discharge, hoarse voice and sore throat.   Eyes: Negative for discharge, redness, vision loss in right eye and visual halos.  Cardiovascular: Negative for chest pain, dyspnea on exertion, leg swelling, orthopnea and palpitations.  Respiratory: Negative for cough, hemoptysis, shortness of breath and snoring.   Endocrine: Negative for heat intolerance and polyphagia.  Hematologic/Lymphatic: Negative for bleeding problem. Does not bruise/bleed easily.  Skin: Negative for flushing, nail changes, rash and  suspicious lesions.  Musculoskeletal: Negative for arthritis, joint pain, muscle cramps, myalgias, neck pain and stiffness.  Gastrointestinal: Negative for abdominal pain, bowel  incontinence, diarrhea and excessive appetite.  Genitourinary: Negative for decreased libido, genital sores and incomplete emptying.  Neurological: Negative for brief paralysis, focal weakness, headaches and loss of balance.  Psychiatric/Behavioral: Negative for altered mental status, depression and suicidal ideas.  Allergic/Immunologic: Negative for HIV exposure and persistent infections.    EKGs/Labs/Other Studies Reviewed:    The following studies were reviewed today:   EKG: None today  Recent Labs: 01/02/2021: ALT 14 01/24/2021: BUN 20; Creatinine, Ser 1.37 01/30/2021: Potassium 3.1; Sodium 134 03/14/2021: Hemoglobin 13.5; Platelets 257.0; TSH 1.05  Recent Lipid Panel    Component Value Date/Time   CHOL 170 01/02/2021 1019   TRIG 170.0 (H) 01/02/2021 1019   HDL 52.20 01/02/2021 1019   CHOLHDL 3 01/02/2021 1019   VLDL 34.0 01/02/2021 1019   LDLCALC 83 01/02/2021 1019   LDLDIRECT 71.0 12/18/2019 1524    Physical Exam:    VS:  BP 120/84 (BP Location: Left Arm, Patient Position: Sitting, Cuff Size: Normal)   Pulse 70   Ht _0  (1.778 m)   Wt 183 lb (83 kg)   SpO2 96%   BMI 26.26 kg/m     Wt Readings from Last 3 Encounters:  03/18/21 183 lb (83 kg)  03/14/21 181 lb 6.4 oz (82.3 kg)  03/11/21 182 lb (82.6 kg)     GEN: Well nourished, well developed in no acute distress HEENT: Normal NECK: No JVD; No carotid bruits LYMPHATICS: No lymphadenopathy CARDIAC: S1S2 noted,RRR, no murmurs, rubs, gallops RESPIRATORY:  Clear to auscultation without rales, wheezing or rhonchi  ABDOMEN: Soft, non-tender, non-distended, +bowel sounds, no guarding. EXTREMITIES: No edema, No cyanosis, no clubbing MUSCULOSKELETAL:  No deformity  SKIN: Warm and dry NEUROLOGIC:  Alert and oriented x 3,  non-focal PSYCHIATRIC:  Normal affect, good insight  ASSESSMENT:    1. Essential (primary) hypertension   2. Ascending aortic aneurysm ([place])   3. NSVT (nonsustained ventricular tachycardia) ([place])   4. PSVT (paroxysmal supraventricular tachycardia) ([place])   5. Shortness of breath    PLAN:     He still is short of breath which appears to be multifactorial.  Unfortunately he has not been able to get his pulmonary function test will still waiting on that.  In the meantime we will refer the patient again for his PFTs.  He had his visit with pulmonary recently in has been recommended to increase medication for his reflux disease.  If pulmonary function test is normal consideration for pulmonary function testing will be discussed.  In the meantime given his history of diastolic dysfunction I am going to try cautious diuretics in his face.  With Lasix 40 mg every other day for 6 doses.  I have to do this gently as the patient does have underlying chronic kidney disease as well.  Other considerations include working up for platypnea orthodeoxia syndrome.  He has had some improvement with the palpitation on his current regimen of flecainide as well as beta-blocker.  The patient is in agreement with the above plan. The patient left the office in stable condition.  The patient will follow up in   Medication Adjustments/Labs and Tests Ordered: Current medicines are reviewed at length with the patient today.  Concerns regarding medicines are outlined above.  Orders Placed This Encounter  Procedures   Basic metabolic panel   Magnesium   Meds ordered this encounter  Medications   furosemide (LASIX) 40 MG tablet    Sig: Take 1 tablet (40 mg total) by mouth every other day.  Dispense:  6 tablet    Refill:  0    Patient Instructions  Medication Instructions:  Your physician has recommended you make the following change in your medication:  START: Furosemide 40 mg take one tablet by mouth  every other day for 6 doses.  *If you need a refill on your cardiac medications before your next appointment, please call your pharmacy*   Lab Work: Your physician recommends that you return for lab work in: [place], [place] If you have labs (blood work) drawn today and your tests are completely normal, you will receive your results only by: [place] (if you have MyChart) OR A paper copy in the mail If you have any lab test that is abnormal or we need to change your treatment, we will call you to review the results.   Testing/Procedures: None   Follow-Up: At [facility], you and your health needs are our priority.  As part of our continuing mission to provide you with exceptional heart care, we have created designated Provider Care Teams.  These Care Teams include your primary Cardiologist (physician) and Advanced Practice Providers (APPs -  Physician Assistants and Nurse Practitioners) who all work together to provide you with the care you need, when you need it.  We recommend signing up for the patient portal called "MyChart".  Sign up information is provided on this After Visit Summary.  MyChart is used to connect with patients for Virtual Visits (Telemedicine).  Patients are able to view lab/test results, encounter notes, upcoming appointments, etc.  Non-urgent messages can be sent to your provider as well.   To learn more about what you can do with MyChart, go to [email or web address].    Your next appointment:   6 month(s)  The format for your next appointment:   In Person  Provider:   Shirlee More, MD   Other Instructions    Adopting a Healthy Lifestyle.  Know what a healthy weight is for you (roughly BMI <25) and aim to maintain this   Aim for 7+ servings of fruits and vegetables daily   65-80+ fluid ounces of water or unsweet tea for healthy kidneys   Limit to max 1 drink of alcohol per day; avoid smoking/tobacco   Limit animal fats in diet for  cholesterol and heart health - choose grass fed whenever available   Avoid highly processed foods, and foods high in saturated/trans fats   Aim for low stress - take time to unwind and care for your mental health   Aim for 150 min of moderate intensity exercise weekly for heart health, and weights twice weekly for bone health   Aim for 7-9 hours of sleep daily   When it comes to diets, agreement about the perfect plan isnt easy to find, even among the experts. Experts at the [employer] developed an idea known as the Healthy Eating Plate. Just imagine a plate divided into logical, healthy portions.   The emphasis is on diet quality:   Load up on vegetables and fruits - one-half of your plate: Aim for color and variety, and remember that potatoes dont count.   Go for whole grains - one-quarter of your plate: Whole wheat, barley, wheat berries, quinoa, oats, brown rice, and foods made with them. If you want pasta, go with whole wheat pasta.   Protein power - one-quarter of your plate: Fish, chicken, beans, and nuts are all healthy, versatile protein sources. Limit red meat.   The diet,  however, does go beyond the plate, offering a few other suggestions.   Use healthy plant oils, such as olive, canola, soy, corn, sunflower and peanut. Check the labels, and avoid partially hydrogenated oil, which have unhealthy trans fats.   If youre thirsty, drink water. Coffee and tea are good in moderation, but skip sugary drinks and limit milk and dairy products to one or two daily servings.   The type of carbohydrate in the diet is more important than the amount. Some sources of carbohydrates, such as vegetables, fruits, whole grains, and beans-are healthier than others.   Finally, stay active  Signed, Berniece Salines, DO  03/18/2021 4:15 PM    [facility]

## 2021-03-18 NOTE — Patient Instructions (Signed)
Medication Instructions:  Your physician has recommended you make the following change in your medication:  START: Furosemide 40 mg take one tablet by mouth every other day for 6 doses.  *If you need a refill on your cardiac medications before your next appointment, please call your pharmacy*   Lab Work: Your physician recommends that you return for lab work in: [place], [place] If you have labs (blood work) drawn today and your tests are completely normal, you will receive your results only by: [place] (if you have MyChart) OR A paper copy in the mail If you have any lab test that is abnormal or we need to change your treatment, we will call you to review the results.   Testing/Procedures: None   Follow-Up: At [facility], you and your health needs are our priority.  As part of our continuing mission to provide you with exceptional heart care, we have created designated Provider Care Teams.  These Care Teams include your primary Cardiologist (physician) and Advanced Practice Providers (APPs -  Physician Assistants and Nurse Practitioners) who all work together to provide you with the care you need, when you need it.  We recommend signing up for the patient portal called "MyChart".  Sign up information is provided on this After Visit Summary.  MyChart is used to connect with patients for Virtual Visits (Telemedicine).  Patients are able to view lab/test results, encounter notes, upcoming appointments, etc.  Non-urgent messages can be sent to your provider as well.   To learn more about what you can do with MyChart, go to [email or web address].    Your next appointment:   6 month(s)  The format for your next appointment:   In Person  Provider:   Shirlee More, MD   Other Instructions

## 2021-03-19 LAB — BASIC METABOLIC PANEL
BUN/Creatinine Ratio: 18 (ref 10–24)
BUN: 19 mg/dL (ref 8–27)
CO2: 24 mmol/L (ref 20–29)
Calcium: 9.5 mg/dL (ref 8.6–10.2)
Chloride: 99 mmol/L (ref 96–106)
Creatinine, Ser: 1.06 mg/dL (ref 0.76–1.27)
Glucose: 101 mg/dL — ABNORMAL HIGH (ref 65–99)
Potassium: 4.3 mmol/L (ref 3.5–5.2)
Sodium: 138 mmol/L (ref 134–144)
eGFR: 78 mL/min/{1.73_m2} (ref >59)

## 2021-03-19 LAB — MAGNESIUM: Magnesium: 1.9 mg/dL (ref 1.6–2.3)

## 2021-04-03 LAB — PULMONARY FUNCTION TEST

## 2021-04-22 ENCOUNTER — Telehealth: Payer: Self-pay

## 2021-04-22 NOTE — Telephone Encounter (Signed)
Left message for patient to return the call.

## 2021-04-23 NOTE — Telephone Encounter (Signed)
Pt is returning call.  

## 2021-04-24 NOTE — Telephone Encounter (Signed)
Called patient. Patient made aware of his results from [facility] pt made aware . pt aware a copy of the results will be left at the front desk for him to pick up to take with him to his appointment. Pt verbalized understanding. No further questions or concerns expressed at this time.

## 2021-04-24 NOTE — Telephone Encounter (Signed)
Patient was returning call 

## 2021-04-30 ENCOUNTER — Ambulatory Visit (INDEPENDENT_AMBULATORY_CARE_PROVIDER_SITE_OTHER): Payer: Medicare Other | Admitting: Internal Medicine

## 2021-04-30 ENCOUNTER — Encounter: Payer: Self-pay | Admitting: Internal Medicine

## 2021-04-30 ENCOUNTER — Other Ambulatory Visit: Payer: Self-pay

## 2021-04-30 VITALS — BP 108/66 | HR 51 | Temp 98.6°F | Ht 70.0 in | Wt 180.0 lb

## 2021-04-30 DIAGNOSIS — R739 Hyperglycemia, unspecified: Secondary | ICD-10-CM | POA: Diagnosis not present

## 2021-04-30 DIAGNOSIS — R0609 Other forms of dyspnea: Secondary | ICD-10-CM

## 2021-04-30 DIAGNOSIS — R0600 Dyspnea, unspecified: Secondary | ICD-10-CM | POA: Diagnosis not present

## 2021-04-30 DIAGNOSIS — I10 Essential (primary) hypertension: Secondary | ICD-10-CM | POA: Diagnosis not present

## 2021-04-30 NOTE — Patient Instructions (Signed)
Please continue all other medications as before, and refills have been done if requested.  Please have the pharmacy call with any other refills you may need.  Please continue your efforts at being more active, low cholesterol diet, and weight control  Please keep your appointments with your specialists as you may have planned - Dr Melvyn Novas

## 2021-04-30 NOTE — Progress Notes (Signed)
Patient ID: Luis Wilkinson, male   DOB: [birth year], 65 y.o.   MRN: [id number]        Chief Complaint: follow up dyspnea unexplained       HPI:  Luis Wilkinson is a 65 y.o. male here with c/o persistent doe with pantin at 100 yds ambulation, with no significant abnormalities on extensive testing, has seen pulmonary recently.  Pt denies chest pain, wheezing, orthopnea, PND, increased LE swelling, palpitations, dizziness or syncope.   Pt denies polydipsia, polyuria, or new focal neuro s/s.  Denies worsening depressive symptoms, suicidal ideation, or panic.   Pt denies fever, wt loss, night sweats, loss of appetite, or other constitutional symptoms     Pt did have recent viral illness now resolved.  Wt Readings from Last 3 Encounters:  04/30/21 180 lb (81.6 kg)  03/18/21 183 lb (83 kg)  03/14/21 181 lb 6.4 oz (82.3 kg)   BP Readings from Last 3 Encounters:  04/30/21 108/66  03/18/21 120/84  03/14/21 110/72         Past Medical History:  Diagnosis Date   Acetabular labrum tear 10/10/2019   Acute sinusitis 02/22/2020   Allergic rhinitis 12/18/2019   Anemia 07/03/2020   Aneurysm, ascending aorta ([place])    followed br Dr Hortencia Pilar   Ascending aortic aneurysm ([place]) 06/11/2015   Body mass index (BMI) 27.0-27.9, adult 03/11/2020   Chest pain of uncertain etiology 10/05/3333   Chronic low back pain 02/22/2020   Colonic mass 10/26/2019   DOE (dyspnea on exertion) 03/16/2019   Onset early June 2020 with assoc chest tightness  - CTa 02/23/19 several segmental and subsegmental PE with micronodular lung dz ? Etiology with no venous dopplers  - 03/16/2019   Walked RA  2 laps @  approx 232f each @ nl pace  stopped due to  End of study with some sob and chest tightness with sats 98%   - trial off acei and max rx for gerd 03/16/2019  - Echo 03/21/2019   No PH  - 03/30/2019   Walked    Droopy eyelid, left 12/18/2019   Dysphagia 01/02/2021   Eosinophilia 03/31/2019   Onset of asthma as child / [facility] eval around 2015     Essential (primary) hypertension 06/11/2015   Change from ACE inhibitor to ARB 03/16/2019 due to unexplained dry cough and chest tightness > completely resolved as of 05/23/2019    Ground glass opacity present on imaging of lung 01/02/2021   History of kidney stones    History of partial colectomy 12/18/2019   History of prosthetic unicompartmental arthroplasty of right knee 03/02/2016   Hyperglycemia 07/04/2020   Hypertension    Insomnia 12/18/2019   Localized, primary osteoarthritis of hand 02/03/2021   Mass 05/04/2017   Nasal sore 12/18/2019   Nuclear sclerotic cataract of left eye 04/21/2016   Other chronic pain 03/12/2020   Other paralytic strabismus, 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   Overweight 02/03/2021   Pain in joint of right hip 09/22/2018   Pain in limb 02/03/2021   Pain in right knee 02/03/2021   Palpitations 06/11/2015   Presence of right artificial knee joint 03/02/2016   Primary osteoarthritis 02/03/2021   Pulmonary emboli ([place] 05/24/2019     CTa 02/23/19 pos for PE 02/23/19  Venous dopplers not done, echo ok    RA (rheumatoid arthritis) ([facility]    Rheumatoid arthritis ([facility] 02/03/2021   S/P right unicompartmental knee replacement    06-20-2015  post dislocating plastic    Seronegative rheumatoid  arthritis ([place]) 12/18/2019   Status post right unicompartmental knee replacement 03/03/2016   Transient neurological symptoms 03/12/2020   Urticaria 02/03/2021   Vitamin D deficiency 02/03/2021   Past Surgical History:  Procedure Laterality Date   CARDIAC CATHETERIZATION  Feb 2015    [place]   per pt normal coronary arteries   CATARACT EXTRACTION W/ INTRAOCULAR LENS IMPLANT Right Mar 2014   CYSTOSCOPY W/ URETEROSCOPY W/ LITHOTRIPSY     EXCISION SUBDERMAL NECK TUMOR  2010   benign   KNEE ARTHROSCOPY W/ MENISCECTOMY Right 01-09-2015   LEFT ELBOW RECONSTRUCTION  2010   PARTIAL KNEE ARTHROPLASTY Right 08/08/2015   Procedure: RIGHT UNICOMPARTMENTAL KNEE REVISION PLASTIC;  Surgeon: Paralee Cancel, MD;   Location: [facility];  Service: Orthopedics;  Laterality: Right;   PARTIAL KNEE ARTHROPLASTY Right 03/02/2016   Procedure: UNICOMPARTMENTAL RIGHT KNEE REVISION;  Surgeon: Paralee Cancel, MD;  Location: [facility];  Service: Orthopedics;  Laterality: Right;   REPLACEMENT UNICONDYLAR JOINT KNEE Right 06-20-2015   RIGHT/LEFT HEART CATH AND CORONARY ANGIOGRAPHY N/A 01/30/2021   Procedure: RIGHT/LEFT HEART CATH AND CORONARY ANGIOGRAPHY;  Surgeon: Belva Crome, MD;  Location: [facility];  Service: Cardiovascular;  Laterality: N/A;   STRABISMUS SURGERY Left x6   last one --Age 18    reports that he quit smoking about 26 years ago. His smoking use included cigarettes. He started smoking about 50 years ago. He has a 5.00 pack-year smoking history. He has never used smokeless tobacco. He reports that he does not drink alcohol and does not use drugs. family history includes Congestive Heart Failure in his father and mother. Allergies  Allergen Reactions   Dilaudid [Hydromorphone] Shortness Of Breath and Other (See Comments)    Chest tight   Hydroxychloroquine Sulfate Swelling, Itching and Other (See Comments)   Morphine Shortness Of Breath    SEVERE   Hydroxychloroquine Rash   Infliximab Rash and Other (See Comments)    (REMICADE)SEVERE   Current Outpatient Medications on File Prior to Visit  Medication Sig Dispense Refill   albuterol (VENTOLIN HFA) 108 (90 Base) MCG/ACT inhaler Inhale 2 puffs into the lungs every 6 (six) hours as needed for wheezing or shortness of breath. 8 g 0   aspirin EC 81 MG tablet Take 1 tablet (81 mg total) by mouth daily. Swallow whole. 90 tablet 3   atorvastatin (LIPITOR) 10 MG tablet Take 10 mg by mouth daily.     Certolizumab Pegol (CIMZIA STARTER KIT [place]) Inject 2 Syringes into the skin every 28 (twenty-eight) days.     DULoxetine (CYMBALTA) 60 MG capsule Take 60 mg by mouth daily.     ergocalciferol (VITAMIN D2) 1.25 MG (50000 UT) capsule Take 50,000  Units by mouth every Friday.     famotidine (PEPCID) 20 MG tablet Take 20 mg by mouth daily. After supper     flecainide (TAMBOCOR) 50 MG tablet Take 1 tablet (50 mg total) by mouth 2 (two) times daily. 180 tablet 3   Glucosamine-Chondroit-Vit C-Mn (GLUCOSAMINE 1500 COMPLEX) CAPS Take 1 capsule by mouth daily.     ibuprofen (ADVIL) 200 MG tablet Take 200 mg by mouth as needed for pain.     leflunomide (ARAVA) 20 MG tablet Take 20 mg by mouth daily.     loratadine-pseudoephedrine (CLARITIN-D 24-HOUR) 10-240 MG 24 hr tablet Take 1 tablet by mouth daily as needed for allergies.     metoprolol succinate (TOPROL-XL) 100 MG 24 hr tablet Take 1 tablet (100 mg  total) by mouth daily. Take with or immediately following a meal. 90 tablet 2   Multiple Vitamin (MULTIVITAMIN WITH MINERALS) TABS tablet Take 1 tablet by mouth in the morning.     pantoprazole (PROTONIX) 40 MG tablet Take 40 mg by mouth daily.     predniSONE (DELTASONE) 5 MG tablet Take 5 mg by mouth in the morning.     RESTASIS 0.05 % ophthalmic emulsion Place 1 drop into both eyes 2 (two) times daily.     telmisartan-hydrochlorothiazide (MICARDIS HCT) 80-25 MG tablet Take 1 tablet by mouth daily. 90 tablet 3   temazepam (RESTORIL) 15 MG capsule TAKE 1 CAPSULE AT BEDTIME  AS NEEDED FOR SLEEP 90 capsule 1   tadalafil (CIALIS) 20 MG tablet Take 0.5-1 tablets (10-20 mg total) by mouth every other day as needed for erectile dysfunction. (Patient not taking: Reported on 04/30/2021) 10 tablet 11   No current facility-administered medications on file prior to visit.        ROS:  All others reviewed and negative.  Objective        PE:  BP 108/66 (BP Location: Right Arm, Patient Position: Sitting, Cuff Size: Normal)   Pulse (!) 51   Temp 98.6 F (37 C) (Oral)   Ht 5' 10" (1.778 m)   Wt 180 lb (81.6 kg)   SpO2 95%   BMI 25.83 kg/m                 Constitutional: Pt appears in NAD               HENT: Head: NCAT.                Right Ear:  External ear normal.                 Left Ear: External ear normal.                Eyes: . Pupils are equal, round, and reactive to light. Conjunctivae and EOM are normal               Nose: without d/c or deformity               Neck: Neck supple. Gross normal ROM               Cardiovascular: Normal rate and regular rhythm.                 Pulmonary/Chest: Effort normal and breath sounds without rales or wheezing.                Abd:  Soft, NT, ND, + BS, no organomegaly               Neurological: Pt is alert. At baseline orientation, motor grossly intact               Skin: Skin is warm. No rashes, no other new lesions, LE edema - none               Psychiatric: Pt behavior is normal without agitation   Micro: none  Cardiac tracings I have personally interpreted today:  none  Pertinent Radiological findings (summarize): none   Lab Results  Component Value Date   WBC 5.8 03/14/2021   HGB 13.5 03/14/2021   HCT 39.6 03/14/2021   PLT 257.0 03/14/2021   GLUCOSE 101 (H) 03/18/2021   CHOL 170 01/02/2021   TRIG 170.0 (H) 01/02/2021   HDL 52.20 01/02/2021  LDLDIRECT 71.0 12/18/2019   LDLCALC 83 01/02/2021   ALT 14 01/02/2021   AST 21 01/02/2021   NA 138 03/18/2021   K 4.3 03/18/2021   CL 99 03/18/2021   CREATININE 1.06 03/18/2021   BUN 19 03/18/2021   CO2 24 03/18/2021   TSH 1.05 03/14/2021   PSA 0.55 01/02/2021   HGBA1C 5.9 01/02/2021   Assessment/Plan:  Luis Wilkinson is a 65 y.o. White or Caucasian [1] male with  has a past medical history of Acetabular labrum tear (10/10/2019), Acute sinusitis (02/22/2020), Allergic rhinitis (12/18/2019), Anemia (07/03/2020), Aneurysm, ascending aorta ([facility]), Ascending aortic aneurysm ([place]) (06/11/2015), Body mass index (BMI) 27.0-27.9, adult (03/11/2020), Chest pain of uncertain etiology (7/82/4235), Chronic low back pain (02/22/2020), Colonic mass (10/26/2019), DOE (dyspnea on exertion) (03/16/2019), Droopy eyelid, left (12/18/2019), Dysphagia  (01/02/2021), Eosinophilia (03/31/2019), Essential (primary) hypertension (06/11/2015), Ground glass opacity present on imaging of lung (01/02/2021), History of kidney stones, History of partial colectomy (12/18/2019), History of prosthetic unicompartmental arthroplasty of right knee (03/02/2016), Hyperglycemia (07/04/2020), Hypertension, Insomnia (12/18/2019), Localized, primary osteoarthritis of hand (02/03/2021), Mass (05/04/2017), Nasal sore (12/18/2019), Nuclear sclerotic cataract of left eye (04/21/2016), Other chronic pain (03/12/2020), Other paralytic strabismus, left eye (12/18/2019), Overweight (02/03/2021), Pain in joint of right hip (09/22/2018), Pain in limb (02/03/2021), Pain in right knee (02/03/2021), Palpitations (06/11/2015), Presence of right artificial knee joint (03/02/2016), Primary osteoarthritis (02/03/2021), Pulmonary emboli ([place]) (05/24/2019), RA (rheumatoid arthritis) ([place]), Rheumatoid arthritis ([place]) (02/03/2021), S/P right unicompartmental knee replacement, Seronegative rheumatoid arthritis ([place]) (12/18/2019), Status post right unicompartmental knee replacement (03/03/2016), Transient neurological symptoms (03/12/2020), Urticaria (02/03/2021), and Vitamin D deficiency (02/03/2021).  DOE (dyspnea on exertion) Etiology unclear, symtpoms out of proportion to essentially normal findings on extensive evaluation, pt reassured, for inhaler prn,  to f/u any worsening symptoms or concerns  Hyperglycemia Lab Results  Component Value Date   HGBA1C 5.9 01/02/2021   Stable, pt to continue current medical treatment  - diet   Essential (primary) hypertension BP Readings from Last 3 Encounters:  04/30/21 108/66  03/18/21 120/84  03/14/21 110/72   Stable, pt to continue medical treatment toprol, micardis hct  Followup: Return if symptoms worsen or fail to improve.  Cathlean Cower, MD 05/03/2021 7:36 PM [facility]

## 2021-05-03 ENCOUNTER — Encounter: Payer: Self-pay | Admitting: Internal Medicine

## 2021-05-03 NOTE — Assessment & Plan Note (Signed)
Lab Results  Component Value Date   HGBA1C 5.9 01/02/2021   Stable, pt to continue current medical treatment  - diet

## 2021-05-03 NOTE — Assessment & Plan Note (Signed)
Etiology unclear, symtpoms out of proportion to essentially normal findings on extensive evaluation, pt reassured, for inhaler prn,  to f/u any worsening symptoms or concerns

## 2021-05-03 NOTE — Assessment & Plan Note (Signed)
BP Readings from Last 3 Encounters:  04/30/21 108/66  03/18/21 120/84  03/14/21 110/72   Stable, pt to continue medical treatment toprol, micardis hct

## 2021-05-05 ENCOUNTER — Other Ambulatory Visit: Payer: Self-pay

## 2021-05-05 ENCOUNTER — Encounter: Payer: Self-pay | Admitting: Internal Medicine

## 2021-05-05 ENCOUNTER — Ambulatory Visit (INDEPENDENT_AMBULATORY_CARE_PROVIDER_SITE_OTHER): Payer: Medicare Other | Admitting: Internal Medicine

## 2021-05-05 DIAGNOSIS — R0600 Dyspnea, unspecified: Secondary | ICD-10-CM

## 2021-05-05 DIAGNOSIS — R0609 Other forms of dyspnea: Secondary | ICD-10-CM

## 2021-05-05 NOTE — Progress Notes (Signed)
Luis Wilkinson, male    DOB: Jul 24, 1956,   MRN: [id number]   Brief patient profile:  32 yowm quit smoking 1995  Asthma as child in elementary school > er sev times mostly outgrew and did fine after that with no limitations but 2012 bad arthritis in hands dx as RA neg rheumatism on remicade/ daily prednisone [name])   then early June 2020 onset doe progressively worse to point of resting sob/ chest tightness > [place] primary >  CTa 02/23/19 pos for PE 02/23/19   Neg cards 03/31/18 [facility]    History of Present Illness  03/16/2019  Pulmonary/ 1st office eval/[name]  Chief Complaint  Patient presents with   Pulmonary Consult    Referred by Dr. Sheppard Coil for sob with exertion and abnormal CT scan. Patient reports that he has sob with exertion and any long distance walking.   Dyspnea:  Chest feels heavy at rest and p 50-100 ft worse assoc with sob no better since started eliquis  Cough: dry cough since onset of doe early June 2020 Sleep: fine lying down flat  SABA use: none rec Increase atenolol to where you take 100 mg in am and 50 mg (one half of 100) in pm  Change pantoprazole to 40 mg Take 30- 60 min before your first and last meals of the day  GERD diet   Stop lisinopril  Start Micardis 80-25 one daily in place of lisinopril      03/30/2019  f/u ov/Angeliz Settlemyre re: unexplained sob p PE  Chief Complaint  Patient presents with   Follow-up    Patient reports that he is doing about the same since last visit with mild improvement.   Dyspnea:  Can't keep up with gdaughter who is 6 riding a bike / had been limited by "bursa"  R hip  X 8 months prior to PE but never had the legs looked at for dvt  Cough: none Sleeping: able to lie flat SABA use: no 02: no rec To get the most out of exercise, you need to be continuously aware that you are short of breath, but never out of breath, for 30 minutes daily. As you improve, it will actually be easier for you to do the same amount of exercise  in   30 minutes so always push to the level where you are short of breath.       05/23/2019  f/u ov/[name] re: s/p PE with improving doe  Chief Complaint  Patient presents with   Follow-up    Breathing has improved back to his normal baseline and no new co's.   Dyspnea:  Not limited by breathing from desired activities   Cough: none Sleeping: fine flat SABA use: none 02: none  Rec I recommend a minimum of 6 months of therapy for pulmonary embolism   >>>  After 6 months would do venous dopplers first to be sure there is no ongoing leg clot and if negative, then ok to stop the eliquis and review/ complete your work up for hypercoagulability and if all neg then either medium dose eliquis  prevent clots or one aspirin a day Dr Sheppard Coil and leave up to him as to whether to send you to a blood clotting specialist due to your mother's history.    Covid Jul 24 2020 > never better since    01/30/21 nl RHC / LHC nl    03/14/2021  f/u ov/Hemi Chacko re:  doe p PE   RA  on pred 5 mg  Chief Complaint  Patient presents with   Follow-up    Dyspnea still having sob on exertion. Inhaler not making it better.  Dyspnea:  one aisle [employer] ever since covid no variability  Cough: none  Sleeping: no resp symptoms  SABA use: no better p rx  02: none  Covid status:   still never vax  On ppi daily with bfast  Rec Pantoprazole (protonix) 40 mg   Take  30-60 min before first meal of the day and Pepcid (famotidine)  20 mg after supper  GERD diet reviewed, bed blocks rec  To get the most out of exercise, you need to be continuously aware that you are short of breath,   Make sure you check your oxygen saturations at highest level of activity     05/05/2021  f/u ov/[name] re: doe p PE/ RA   maint off prednisone x one month so he can get "laser treatments from his chiropractor" , more fatigued since then Chief Complaint  Patient presents with   Follow-up    DOE  Dyspnea:  walking 5-10 min and stops due doe with  lowest 92%  Cough: none  Sleeping: able to lie down flat  SABA use: no better with  02: none  Covid status:  infected x one  then ? Reinfection 3 weeks prior to OV     No obvious day to day or daytime variability or assoc excess/ purulent sputum or mucus plugs or hemoptysis or cp or chest tightness, subjective wheeze or overt sinus or hb symptoms.   Sleeping fine  without nocturnal  or early am exacerbation  of respiratory  c/o's or need for noct saba. Also denies any obvious fluctuation of symptoms with weather or environmental changes or other aggravating or alleviating factors except as outlined above   No unusual exposure hx or h/o childhood pna/ asthma or knowledge of premature birth.  Current Allergies, Complete Past Medical History, Past Surgical History, Family History, and Social History were reviewed in [employer] record.  ROS  The following are not active complaints unless bolded Hoarseness, sore throat, dysphagia, dental problems, itching, sneezing,  nasal congestion or discharge of excess mucus or purulent secretions, ear ache,   fever, chills, sweats, unintended wt loss or wt gain, classically pleuritic or exertional cp,  orthopnea pnd or arm/hand swelling  or leg swelling, presyncope, palpitations, abdominal pain, anorexia, nausea, vomiting, diarrhea  or change in bowel habits or change in bladder habits, change in stools or change in urine, dysuria, hematuria,  rash, arthralgias, visual complaints, headache, numbness, weakness or ataxia or problems with walking or coordination,  change in mood or  memory.        Current Meds  Medication Sig   albuterol (VENTOLIN HFA) 108 (90 Base) MCG/ACT inhaler Inhale 2 puffs into the lungs every 6 (six) hours as needed for wheezing or shortness of breath.   aspirin EC 81 MG tablet Take 1 tablet (81 mg total) by mouth daily. Swallow whole.   atorvastatin (LIPITOR) 10 MG tablet Take 10 mg by mouth daily.    Certolizumab Pegol (CIMZIA STARTER KIT [place]) Inject 2 Syringes into the skin every 28 (twenty-eight) days.   DULoxetine (CYMBALTA) 60 MG capsule Take 60 mg by mouth daily.   ergocalciferol (VITAMIN D2) 1.25 MG (50000 UT) capsule Take 50,000 Units by mouth every Friday.   famotidine (PEPCID) 20 MG tablet Take 20 mg by mouth daily. After supper  flecainide (TAMBOCOR) 50 MG tablet Take 1 tablet (50 mg total) by mouth 2 (two) times daily.   Glucosamine-Chondroit-Vit C-Mn (GLUCOSAMINE 1500 COMPLEX) CAPS Take 1 capsule by mouth daily.   ibuprofen (ADVIL) 200 MG tablet Take 200 mg by mouth as needed for pain.   leflunomide (ARAVA) 20 MG tablet Take 20 mg by mouth daily.   loratadine-pseudoephedrine (CLARITIN-D 24-HOUR) 10-240 MG 24 hr tablet Take 1 tablet by mouth daily as needed for allergies.   metoprolol succinate (TOPROL-XL) 100 MG 24 hr tablet Take 1 tablet (100 mg total) by mouth daily. Take with or immediately following a meal.   Multiple Vitamin (MULTIVITAMIN WITH MINERALS) TABS tablet Take 1 tablet by mouth in the morning.   pantoprazole (PROTONIX) 40 MG tablet Take 40 mg by mouth daily.   predniSONE (DELTASONE) 5 MG tablet On hold x one month (around 04/08/21) .   RESTASIS 0.05 % ophthalmic emulsion Place 1 drop into both eyes 2 (two) times daily.   tadalafil (CIALIS) 20 MG tablet Take 0.5-1 tablets (10-20 mg total) by mouth every other day as needed for erectile dysfunction.   telmisartan-hydrochlorothiazide (MICARDIS HCT) 80-25 MG tablet Take 1 tablet by mouth daily.   temazepam (RESTORIL) 15 MG capsule TAKE 1 CAPSULE AT BEDTIME  AS NEEDED FOR SLEEP                        Past Medical History:  Diagnosis Date   Aneurysm, ascending aorta ([place])    followed br Dr Hortencia Pilar   History of kidney stones    Hypertension    RA (rheumatoid arthritis) ([place])    S/P right unicompartmental knee replacement    06-20-2015  post dislocating plastic       Objective:    05/05/2021        180   03/14/2021         181   05/23/2019    189   03/30/19 192 lb 6.4 oz (87.3 kg)  03/16/19 189 lb (85.7 kg)  01/12/19 182 lb (82.6 kg)     Vital signs reviewed  05/05/2021  - Note at rest 02 sats  99% on RA   General appearance:    healthy appearing amb wm nad   HEENT : pt wearing mask not removed for exam due to covid -19 concerns.    NECK :  without JVD/Nodes/TM/ nl carotid upstrokes bilaterally   LUNGS: no acc muscle use,  Nl contour chest which is clear to A and P bilaterally without cough on insp or exp maneuvers   CV:  RRR  no s3 or murmur or increase in P2, and no edema   ABD:  soft and nontender with nl inspiratory excursion in the supine position. No bruits or organomegaly appreciated, bowel sounds nl  MS:  Nl gait/ ext warm without deformities, calf tenderness, cyanosis or clubbing No obvious joint restrictions   SKIN: warm and dry without lesions    NEURO:  alert, approp, nl sensorium with  no motor or cerebellar deficits apparent.        Cxr at [facility] 2 weeks prior to ov nl per pt        Assessment

## 2021-05-05 NOTE — Patient Instructions (Addendum)
To get the most out of exercise, you need to be continuously aware that you are short of breath, but never out of breath, for at least 30 minutes daily. As you improve, it will actually be easier for you to do the same amount of exercise  in  30 minutes so always push to the level where you are short of breath.    Make sure you check your oxygen saturations at highest level of activity and see if they are trending down - if they are trending down you need a HRCT of chest - call me to schedule   If not making progress over the next 6 weeks then next step is a cpst

## 2021-05-06 ENCOUNTER — Encounter: Payer: Self-pay | Admitting: Internal Medicine

## 2021-05-06 NOTE — Assessment & Plan Note (Addendum)
Onset early June 2020 with assoc chest tightness  - CTa 02/23/19 several segmental and subsegmental PE with micronodular lung dz ? Etiology with no venous dopplers  - 03/16/2019   Walked RA  2 laps @  approx 268ft each @ nl pace  stopped due to  End of study with some sob and chest tightness with sats 98%   - trial off acei and max rx for gerd 03/16/2019  - Echo 03/21/2019   No PH  - 03/30/2019   Walked RA  3 laps @  approx 230ft each @ fast pace  stopped due to end of study, mild sob but sats 99%  01/30/21 nl RHC / LHC nl  -  03/14/2021   Walked RA  2 laps @ approx 279ft each @ moderate to fast  pace  stopped due to end of study, , no sob and sats 94% at end    - PFTs 04/03/21 wnl x ERV only 12% @ wt 182 / dlco 79% corrected with nl f/v loop - 05/05/2021   Walked on RA  x  3  lap(s) =  approx 750 @ only moderate (was asked to go fast as he could )  pace, stopped due to end of study s symptoms and  lowest 02 sats 96%    Strongly suspect this is just deconditioning at this point so rec sub max ex x 30 min daily and then p 6 weeks consider cpst if not improving to his satisfaction.  Each maintenance medication was reviewed in detail including emphasizing most importantly the difference between maintenance and prns and under what circumstances the prns are to be triggered using an action plan format where appropriate.  Total time for H and P, chart review, counseling,  directly observing portions of ambulatory 02 saturation study/ and generating customized AVS unique to this office visit / same day charting  > 30 min

## 2021-05-08 ENCOUNTER — Encounter: Payer: Self-pay | Admitting: Internal Medicine

## 2021-05-08 ENCOUNTER — Telehealth (INDEPENDENT_AMBULATORY_CARE_PROVIDER_SITE_OTHER): Payer: Medicare Other | Admitting: Internal Medicine

## 2021-05-08 DIAGNOSIS — J069 Acute upper respiratory infection, unspecified: Secondary | ICD-10-CM | POA: Diagnosis not present

## 2021-05-08 DIAGNOSIS — R739 Hyperglycemia, unspecified: Secondary | ICD-10-CM

## 2021-05-08 DIAGNOSIS — E559 Vitamin D deficiency, unspecified: Secondary | ICD-10-CM

## 2021-05-08 MED ORDER — LEVOFLOXACIN 500 MG PO TABS: 500.0000 mg | ORAL_TABLET | Freq: Every day | ORAL | 0 refills | Status: AC

## 2021-05-08 NOTE — Assessment & Plan Note (Signed)
Last vitamin D Lab Results  Component Value Date   VD25OH 43.38 01/02/2021   Stable, cont oral replacement

## 2021-05-08 NOTE — Assessment & Plan Note (Signed)
Lab Results  Component Value Date   HGBA1C 5.9 01/02/2021   Stable, pt to continue current medical treatment  - diet

## 2021-05-08 NOTE — Assessment & Plan Note (Signed)
Mild to mod, for antibx course,  to f/u any worsening symptoms or concerns 

## 2021-05-08 NOTE — Patient Instructions (Signed)
Please take all new medication as prescribed 

## 2021-05-08 NOTE — Progress Notes (Signed)
Patient ID: Luis Wilkinson, male   DOB: 1956-07-25, 65 y.o.   MRN: [id number]  Virtual Visit via Video Note  I connected with Calvert Cantor on 05/08/21 at  1:40 PM EDT by a video enabled telemedicine application and verified that I am speaking with the correct person using two identifiers.  Location of all participants today Patient: at home Provider: at office   I discussed the limitations of evaluation and management by telemedicine and the availability of in person appointments. The patient expressed understanding and agreed to proceed.  History of Present Illness:  Here with 2-3 wks intermittent acute onset fever, facial pain, pressure, headache, general weakness and malaise, and greenish d/c, with mild ST and cough, but pt denies chest pain, wheezing, increased sob or doe, orthopnea, PND, increased LE swelling, palpitations, dizziness or syncope.   Pt denies polydipsia, polyuria, or new focal neuro s/s.  Recent cxr neg at [facility]  COVDI neg x 2 .  Now taking VIt D Past Medical History:  Diagnosis Date   Acetabular labrum tear 10/10/2019   Acute sinusitis 02/22/2020   Allergic rhinitis 12/18/2019   Anemia 07/03/2020   Aneurysm, ascending aorta (HCC)    followed br Dr Hortencia Pilar   Ascending aortic aneurysm ([place]) 06/11/2015   Body mass index (BMI) 27.0-27.9, adult 03/11/2020   Chest pain of uncertain etiology 0/94/0768   Chronic low back pain 02/22/2020   Colonic mass 10/26/2019   DOE (dyspnea on exertion) 03/16/2019   Onset early June 2020 with assoc chest tightness  - CTa 02/23/19 several segmental and subsegmental PE with micronodular lung dz ? Etiology with no venous dopplers  - 03/16/2019   Walked RA  2 laps @  approx 269ft each @ nl pace  stopped due to  End of study with some sob and chest tightness with sats 98%   - trial off acei and max rx for gerd 03/16/2019  - Echo 03/21/2019   No PH  - 03/30/2019   Walked    Droopy eyelid, left 12/18/2019   Dysphagia 01/02/2021   Eosinophilia 03/31/2019   Onset of  asthma as child / [facility] eval around 2015    Essential (primary) hypertension 06/11/2015   Change from ACE inhibitor to ARB 03/16/2019 due to unexplained dry cough and chest tightness > completely resolved as of 05/23/2019    Ground glass opacity present on imaging of lung 01/02/2021   History of kidney stones    History of partial colectomy 12/18/2019   History of prosthetic unicompartmental arthroplasty of right knee 03/02/2016   Hyperglycemia 07/04/2020   Hypertension    Insomnia 12/18/2019   Localized, primary osteoarthritis of hand 02/03/2021   Mass 05/04/2017   Nasal sore 12/18/2019   Nuclear sclerotic cataract of left eye 04/21/2016   Other chronic pain 03/12/2020   Other paralytic strabismus, 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   Overweight 02/03/2021   Pain in joint of right hip 09/22/2018   Pain in limb 02/03/2021   Pain in right knee 02/03/2021   Palpitations 06/11/2015   Presence of right artificial knee joint 03/02/2016   Primary osteoarthritis 02/03/2021   Pulmonary emboli ([place]) 05/24/2019     CTa 02/23/19 pos for PE 02/23/19  Venous dopplers not done, echo ok    RA (rheumatoid arthritis) ([place])    Rheumatoid arthritis ([place]) 02/03/2021   S/P right unicompartmental knee replacement    06-20-2015  post dislocating plastic    Seronegative rheumatoid arthritis ([place]) 12/18/2019   Status post right unicompartmental knee replacement  03/03/2016   Transient neurological symptoms 03/12/2020   Urticaria 02/03/2021   Vitamin D deficiency 02/03/2021   Past Surgical History:  Procedure Laterality Date   CARDIAC CATHETERIZATION  Feb 2015    [place]   per pt normal coronary arteries   CATARACT EXTRACTION W/ INTRAOCULAR LENS IMPLANT Right Mar 2014   CYSTOSCOPY W/ URETEROSCOPY W/ LITHOTRIPSY     EXCISION SUBDERMAL NECK TUMOR  2010   benign   KNEE ARTHROSCOPY W/ MENISCECTOMY Right 01-09-2015   LEFT ELBOW RECONSTRUCTION  2010   PARTIAL KNEE ARTHROPLASTY Right 08/08/2015   Procedure: RIGHT UNICOMPARTMENTAL KNEE  REVISION PLASTIC;  Surgeon: Paralee Cancel, MD;  Location: [facility];  Service: Orthopedics;  Laterality: Right;   PARTIAL KNEE ARTHROPLASTY Right 03/02/2016   Procedure: UNICOMPARTMENTAL RIGHT KNEE REVISION;  Surgeon: Paralee Cancel, MD;  Location: [facility];  Service: Orthopedics;  Laterality: Right;   REPLACEMENT UNICONDYLAR JOINT KNEE Right 06-20-2015   RIGHT/LEFT HEART CATH AND CORONARY ANGIOGRAPHY N/A 01/30/2021   Procedure: RIGHT/LEFT HEART CATH AND CORONARY ANGIOGRAPHY;  Surgeon: Belva Crome, MD;  Location: [facility];  Service: Cardiovascular;  Laterality: N/A;   STRABISMUS SURGERY Left x6   last one --Age 85    reports that he quit smoking about 26 years ago. His smoking use included cigarettes. He started smoking about 50 years ago. He has a 5.00 pack-year smoking history. He has never used smokeless tobacco. He reports that he does not drink alcohol and does not use drugs. family history includes Congestive Heart Failure in his father and mother. Allergies  Allergen Reactions   Dilaudid [Hydromorphone] Shortness Of Breath and Other (See Comments)    Chest tight   Hydroxychloroquine Sulfate Swelling, Itching and Other (See Comments)   Morphine Shortness Of Breath    SEVERE   Hydroxychloroquine Rash   Infliximab Rash and Other (See Comments)    (REMICADE)SEVERE   Current Outpatient Medications on File Prior to Visit  Medication Sig Dispense Refill   albuterol (VENTOLIN HFA) 108 (90 Base) MCG/ACT inhaler Inhale 2 puffs into the lungs every 6 (six) hours as needed for wheezing or shortness of breath. 8 g 0   aspirin EC 81 MG tablet Take 1 tablet (81 mg total) by mouth daily. Swallow whole. 90 tablet 3   atorvastatin (LIPITOR) 10 MG tablet Take 10 mg by mouth daily.     Certolizumab Pegol (CIMZIA STARTER KIT [place]) Inject 2 Syringes into the skin every 28 (twenty-eight) days.     DULoxetine (CYMBALTA) 60 MG capsule Take 60 mg by mouth daily.     ergocalciferol  (VITAMIN D2) 1.25 MG (50000 UT) capsule Take 50,000 Units by mouth every Friday.     famotidine (PEPCID) 20 MG tablet Take 20 mg by mouth daily. After supper     flecainide (TAMBOCOR) 50 MG tablet Take 1 tablet (50 mg total) by mouth 2 (two) times daily. 180 tablet 3   Glucosamine-Chondroit-Vit C-Mn (GLUCOSAMINE 1500 COMPLEX) CAPS Take 1 capsule by mouth daily.     ibuprofen (ADVIL) 200 MG tablet Take 200 mg by mouth as needed for pain.     leflunomide (ARAVA) 20 MG tablet Take 20 mg by mouth daily.     loratadine-pseudoephedrine (CLARITIN-D 24-HOUR) 10-240 MG 24 hr tablet Take 1 tablet by mouth daily as needed for allergies.     metoprolol succinate (TOPROL-XL) 100 MG 24 hr tablet Take 1 tablet (100 mg total) by mouth daily. Take with or immediately following a meal.  90 tablet 2   Multiple Vitamin (MULTIVITAMIN WITH MINERALS) TABS tablet Take 1 tablet by mouth in the morning.     pantoprazole (PROTONIX) 40 MG tablet Take 40 mg by mouth daily.     predniSONE (DELTASONE) 5 MG tablet Take 5 mg by mouth in the morning.     RESTASIS 0.05 % ophthalmic emulsion Place 1 drop into both eyes 2 (two) times daily.     tadalafil (CIALIS) 20 MG tablet Take 0.5-1 tablets (10-20 mg total) by mouth every other day as needed for erectile dysfunction. 10 tablet 11   telmisartan-hydrochlorothiazide (MICARDIS HCT) 80-25 MG tablet Take 1 tablet by mouth daily. 90 tablet 3   temazepam (RESTORIL) 15 MG capsule TAKE 1 CAPSULE AT BEDTIME  AS NEEDED FOR SLEEP 90 capsule 1   No current facility-administered medications on file prior to visit.    Observations/Objective: Alert, NAD, appropriate mood and affect, resps normal, cn 2-12 intact, moves all 4s, no visible rash or swelling Lab Results  Component Value Date   WBC 5.8 03/14/2021   HGB 13.5 03/14/2021   HCT 39.6 03/14/2021   PLT 257.0 03/14/2021   GLUCOSE 101 (H) 03/18/2021   CHOL 170 01/02/2021   TRIG 170.0 (H) 01/02/2021   HDL 52.20 01/02/2021   LDLDIRECT  71.0 12/18/2019   LDLCALC 83 01/02/2021   ALT 14 01/02/2021   AST 21 01/02/2021   NA 138 03/18/2021   K 4.3 03/18/2021   CL 99 03/18/2021   CREATININE 1.06 03/18/2021   BUN 19 03/18/2021   CO2 24 03/18/2021   TSH 1.05 03/14/2021   PSA 0.55 01/02/2021   HGBA1C 5.9 01/02/2021   Assessment and Plan: See notes  Follow Up Instructions: See notes   I discussed the assessment and treatment plan with the patient. The patient was provided an opportunity to ask questions and all were answered. The patient agreed with the plan and demonstrated an understanding of the instructions.   The patient was advised to call back or seek an in-person evaluation if the symptoms worsen or if the condition fails to improve as anticipated.   Cathlean Cower, MD

## 2021-05-14 ENCOUNTER — Other Ambulatory Visit: Payer: Self-pay | Admitting: Internal Medicine

## 2021-05-23 ENCOUNTER — Other Ambulatory Visit: Payer: Self-pay

## 2021-05-23 MED ORDER — METOPROLOL SUCCINATE ER 100 MG PO TB24: 100.0000 mg | ORAL_TABLET | Freq: Every day | ORAL | 2 refills | Status: DC

## 2021-05-23 NOTE — Telephone Encounter (Signed)
Refill of Metoprolol Succinate 100 mg sent to [facility].

## 2021-05-27 ENCOUNTER — Other Ambulatory Visit: Payer: Self-pay | Admitting: Internal Medicine

## 2021-05-27 NOTE — Telephone Encounter (Signed)
Please refill as per office routine med refill policy (all routine meds to be refilled for 3 mo or monthly (per pt preference) up to one year from last visit, then month to month grace period for 3 mo, then further med refills will have to be denied) ? ?

## 2021-06-18 ENCOUNTER — Other Ambulatory Visit: Payer: Self-pay | Admitting: Internal Medicine

## 2021-06-18 NOTE — Telephone Encounter (Signed)
Oh sorry, med has been stopped, no refill needed

## 2021-06-18 NOTE — Telephone Encounter (Signed)
Please refill as per office routine med refill policy (all routine meds to be refilled for 3 mo or monthly (per pt preference) up to one year from last visit, then month to month grace period for 3 mo, then further med refills will have to be denied) ? ?

## 2021-06-22 ENCOUNTER — Other Ambulatory Visit: Payer: Self-pay | Admitting: Internal Medicine

## 2021-06-22 NOTE — Telephone Encounter (Signed)
Please refill as per office routine med refill policy (all routine meds to be refilled for 3 mo or monthly (per pt preference) up to one year from last visit, then month to month grace period for 3 mo, then further med refills will have to be denied) ? ?

## 2021-06-23 ENCOUNTER — Ambulatory Visit (INDEPENDENT_AMBULATORY_CARE_PROVIDER_SITE_OTHER): Payer: Medicare Other | Admitting: Internal Medicine

## 2021-06-23 ENCOUNTER — Encounter: Payer: Self-pay | Admitting: Internal Medicine

## 2021-06-23 DIAGNOSIS — R0609 Other forms of dyspnea: Secondary | ICD-10-CM

## 2021-06-23 NOTE — Assessment & Plan Note (Addendum)
Onset early June 2020 with assoc chest tightness  - CTa 02/23/19 several segmental and subsegmental PE with micronodular lung dz ? Etiology with no venous dopplers  - 03/16/2019   Walked RA  2 laps @  approx 214ft each @ nl pace  stopped due to  End of study with some sob and chest tightness with sats 98%   - trial off acei and max rx for gerd 03/16/2019  - Echo 03/21/2019   No PH  - 03/30/2019   Walked RA  3 laps @  approx 217ft each @ fast pace  stopped due to end of study, mild sob but sats 99%  01/30/21 nl RHC / LHC nl  -  03/14/2021   Walked RA  2 laps @ approx 260ft each @ moderate to fast  pace  stopped due to end of study, , no sob and sats 94% at end   - PFTs 04/03/21 wnl x ERV only 12% @ wt 182 / dlco 79% corrected with nl f/v - 05/05/2021   Walked on RA  x  3  lap(s) =  approx 750 @ only moderate (was asked to go fast as he could )  pace, stopped due to end of study s symptoms and  lowest 02 sats 96%    Summary discussion: Gradually improved doe back near baseline Still not checking sats at peak ex which I rec he do given RA effects / potential for ILD which may flare due to RA or RA meds Concern with high doses of metaprolol possibly contributing ot airflow obstruction but this does not appear to be the case presently but rec the following   Re SABA :  I spent extra time with pt today reviewing appropriate use of albuterol for prn use on exertion with the following points: 1) saba is for relief of sob that does not improve by walking a slower pace or resting but rather if the pt does not improve after trying this first. 2) If the pt is convinced, as many are, that saba helps recover from activity faster then it's easy to tell if this is the case by re-challenging : ie stop, take the inhaler, then p 5 minutes try the exact same activity (intensity of workload) that just caused the symptoms and see if they are substantially diminished or not after saba 3) if there is an activity that reproducibly  causes the symptoms, try the saba 15 min before the activity on alternate days   If in fact the saba really does help, then fine to continue to use it prn but advised may need to look closer at the maintenance regimen being used to achieve better control of airways disease with exertion.   If losing ground cpst with saba before and after best bet for next study, otherwise pulmonary f/u is prn          Each maintenance medication was reviewed in detail including emphasizing most importantly the difference between maintenance and prns and under what circumstances the prns are to be triggered using an action plan format where appropriate.  Total time for H and P, chart review, counseling, reviewing hfa device(s) and generating customized AVS unique to this summary final office visit / same day charting = 31 min

## 2021-06-23 NOTE — Progress Notes (Signed)
Luis Wilkinson, male    DOB: 1956-01-15    MRN: [id number]   Brief patient profile:  63 yowm quit smoking 1995  Asthma as child in elementary school > er sev times mostly outgrew and did fine after that with no limitations but 2012 bad arthritis in hands dx as RA neg rheumatism on remicade/ daily prednisone [name])   then early June 2020 onset doe progressively worse to point of resting sob/ chest tightness > [place] primary >  CTa 02/23/19 pos for PE 02/23/19   Neg cards 03/31/18 [facility]    History of Present Illness  03/16/2019  Pulmonary/ 1st office eval/Phuong Moffatt  Chief Complaint  Patient presents with   Pulmonary Consult    Referred by Dr. Sheppard Coil for sob with exertion and abnormal CT scan. Patient reports that he has sob with exertion and any long distance walking.   Dyspnea:  Chest feels heavy at rest and p 50-100 ft worse assoc with sob no better since started eliquis  Cough: dry cough since onset of doe early June 2020 Sleep: fine lying down flat  SABA use: none rec Increase atenolol to where you take 100 mg in am and 50 mg (one half of 100) in pm  Change pantoprazole to 40 mg Take 30- 60 min before your first and last meals of the day  GERD diet   Stop lisinopril  Start Micardis 80-25 one daily in place of lisinopril      03/30/2019  f/u ov/Zakira Ressel re: unexplained sob p PE  Chief Complaint  Patient presents with   Follow-up    Patient reports that he is doing about the same since last visit with mild improvement.   Dyspnea:  Can't keep up with gdaughter who is 6 riding a bike / had been limited by "bursa"  R hip  X 8 months prior to PE but never had the legs looked at for dvt  Cough: none Sleeping: able to lie flat SABA use: no 02: no rec To get the most out of exercise, you need to be continuously aware that you are short of breath, but never out of breath, for 30 minutes daily. As you improve, it will actually be easier for you to do the same amount of exercise  in   30 minutes so always push to the level where you are short of breath.       05/23/2019  f/u ov/Dorothy Polhemus re: s/p PE with improving doe  Chief Complaint  Patient presents with   Follow-up    Breathing has improved back to his normal baseline and no new co's.   Dyspnea:  Not limited by breathing from desired activities   Cough: none Sleeping: fine flat SABA use: none 02: none  Rec I recommend a minimum of 6 months of therapy for pulmonary embolism   >>>  After 6 months would do venous dopplers first to be sure there is no ongoing leg clot and if negative, then ok to stop the eliquis and review/ complete your work up for hypercoagulability and if all neg then either medium dose eliquis  prevent clots or one aspirin a day Dr Sheppard Coil and leave up to him as to whether to send you to a blood clotting specialist due to your mother's history.    Covid Jul 24 2020 > never better since    01/30/21 nl RHC / LHC nl    03/14/2021  f/u ov/Rossie Scarfone re:  doe p PE  RA on pred 5 mg  Chief Complaint  Patient presents with   Follow-up    Dyspnea still having sob on exertion. Inhaler not making it better.  Dyspnea:  one aisle [employer] ever since covid no variability  Cough: none  Sleeping: no resp symptoms  SABA use: no better p rx  02: none  Covid status:   still never vax  On ppi daily with bfast  Rec Pantoprazole (protonix) 40 mg   Take  30-60 min before first meal of the day and Pepcid (famotidine)  20 mg after supper  GERD diet reviewed, bed blocks rec  To get the most out of exercise, you need to be continuously aware that you are short of breath,   Make sure you check your oxygen saturations at highest level of activity     05/05/2021  f/u ov/Ules Marsala re: doe p PE/ RA   maint off prednisone x one month so he can get "laser treatments from his chiropractor" , more fatigued since then Chief Complaint  Patient presents with   Follow-up    DOE  Dyspnea:  walking 5-10 min and stops due to doe with  lowest 92%  Cough: none  Sleeping: able to lie down flat  SABA use: no better with  02: none  Covid status:  infected x one  then ? Reinfection 3 weeks prior to OV   Rec To get the most out of exercise, you need to be continuously aware that you are short of breath, but never out of breath, for at least 30 minutes daily.  Make sure you check your oxygen saturations at highest level of activity If not making progress over the next 6 weeks then next step is a cpst     06/23/2021  f/u ov/Demond Shallenberger re: doe p PE/ RA  maint on pred 5 mg daily "much better energy/ arthritis" Chief Complaint  Patient presents with   Follow-up    DOE  Dyspnea:  walking 30 min 3 x weekly and distance is increasing  Cough: none Sleeping: flat/ one pillow no prob  SABA use: none - never prechallenges 02: none  Covid status:   never vax / omicron Dec 2021    No obvious day to day or daytime variability or assoc excess/ purulent sputum or mucus plugs or hemoptysis or cp or chest tightness, subjective wheeze or overt sinus or hb symptoms.   Sleeping  without nocturnal  or early am exacerbation  of respiratory  c/o's or need for noct saba. Also denies any obvious fluctuation of symptoms with weather or environmental changes or other aggravating or alleviating factors except as outlined above   No unusual exposure hx or h/o childhood pna/  knowledge of premature birth.  Current Allergies, Complete Past Medical History, Past Surgical History, Family History, and Social History were reviewed in [employer] record.  ROS  The following are not active complaints unless bolded Hoarseness, sore throat, dysphagia, dental problems, itching, sneezing,  nasal congestion or discharge of excess mucus or purulent secretions, ear ache,   fever, chills, sweats, unintended wt loss or wt gain, classically pleuritic or exertional cp,  orthopnea pnd or arm/hand swelling  or leg swelling, presyncope, palpitations,  abdominal pain, anorexia, nausea, vomiting, diarrhea  or change in bowel habits or change in bladder habits, change in stools or change in urine, dysuria, hematuria,  rash, arthralgias, visual complaints, headache, numbness, weakness or ataxia or problems with walking or coordination,  change in mood  or  memory.        Current Meds  Medication Sig   albuterol (VENTOLIN HFA) 108 (90 Base) MCG/ACT inhaler Inhale 2 puffs into the lungs every 6 (six) hours as needed for wheezing or shortness of breath.   aspirin EC 81 MG tablet Take 1 tablet (81 mg total) by mouth daily. Swallow whole.   atorvastatin (LIPITOR) 10 MG tablet Take 10 mg by mouth daily.   Certolizumab Pegol (CIMZIA STARTER KIT [place]) Inject 2 Syringes into the skin every 28 (twenty-eight) days.   DULoxetine (CYMBALTA) 60 MG capsule Take 60 mg by mouth daily.   ergocalciferol (VITAMIN D2) 1.25 MG (50000 UT) capsule Take 50,000 Units by mouth every Friday.   famotidine (PEPCID) 20 MG tablet Take 20 mg by mouth daily. After supper   flecainide (TAMBOCOR) 50 MG tablet Take 1 tablet (50 mg total) by mouth 2 (two) times daily.   Glucosamine-Chondroit-Vit C-Mn (GLUCOSAMINE 1500 COMPLEX) CAPS Take 1 capsule by mouth daily.   ibuprofen (ADVIL) 200 MG tablet Take 200 mg by mouth as needed for pain.   leflunomide (ARAVA) 20 MG tablet Take 20 mg by mouth daily.   loratadine-pseudoephedrine (CLARITIN-D 24-HOUR) 10-240 MG 24 hr tablet Take 1 tablet by mouth daily as needed for allergies.   metoprolol succinate (TOPROL-XL) 100 MG 24 hr tablet Take 1 tablet (100 mg total) by mouth daily. Take with or immediately following a meal.   Multiple Vitamin (MULTIVITAMIN WITH MINERALS) TABS tablet Take 1 tablet by mouth in the morning.   pantoprazole (PROTONIX) 40 MG tablet TAKE 30-60 MINUTES BEFORE  YOUR FIRST AND LAST MEALS  OF THE DAY   predniSONE (DELTASONE) 5 MG tablet Take 5 mg by mouth in the morning.   RESTASIS 0.05 % ophthalmic emulsion Place 1 drop into  both eyes 2 (two) times daily.   tadalafil (CIALIS) 20 MG tablet Take 0.5-1 tablets (10-20 mg total) by mouth every other day as needed for erectile dysfunction.   telmisartan-hydrochlorothiazide (MICARDIS HCT) 80-25 MG tablet Take 1 tablet by mouth daily.   temazepam (RESTORIL) 15 MG capsule TAKE 1 CAPSULE AT BEDTIME  AS NEEDED FOR SLEEP                         Past Medical History:  Diagnosis Date   Aneurysm, ascending aorta ([place])    followed br Dr Hortencia Pilar   History of kidney stones    Hypertension    RA (rheumatoid arthritis) ([place])    S/P right unicompartmental knee replacement    06-20-2015  post dislocating plastic       Objective:     06/23/2021      180  05/05/2021        180  03/14/2021         181   05/23/2019    189   03/30/19 192 lb 6.4 oz (87.3 kg)  03/16/19 189 lb (85.7 kg)  01/12/19 182 lb (82.6 kg)     Vital signs reviewed  06/23/2021  - Note at rest 02 sats  100% on RA   General appearance:    amb wm nad    HEENT : pt wearing mask not removed for exam due to covid -19 concerns.    NECK :  without JVD/Nodes/TM/ nl carotid upstrokes bilaterally   LUNGS: no acc muscle use,  Nl contour chest which is clear to A and P bilaterally without cough on insp or exp maneuvers  CV:  RRR  no s3 or murmur or increase in P2, and no edema   ABD:  soft and nontender with nl inspiratory excursion in the supine position. No bruits or organomegaly appreciated, bowel sounds nl  MS:  Nl gait/ ext warm without deformities, calf tenderness, cyanosis or clubbing No obvious joint restrictions   SKIN: warm and dry without lesions    NEURO:  alert, approp, nl sensorium with  no motor or cerebellar deficits apparent.        Assessment

## 2021-06-23 NOTE — Patient Instructions (Addendum)
Make sure you check your oxygen saturation while at your highest level of activity(not after)  to be sure it stays over 90% and keep track of it at least once a week, more often if breathing getting worse, and let me know if losing ground.    Ok to try albuterol 15 min before an activity (on alternating days)  that you know would usually make you short of breath and see if it makes any difference and if makes none then don't take albuterol after activity unless you can't catch your breath as this means it's the resting that helps, not the albuterol.       If you are satisfied with your treatment plan,  let your doctor know and he/she can either refill your medications or you can return here when your prescription runs out.     If in any way you are not 100% satisfied,  please tell us.  If 100% better, tell your friends!  Pulmonary follow up is as needed

## 2021-07-10 ENCOUNTER — Other Ambulatory Visit: Payer: Self-pay | Admitting: Internal Medicine

## 2021-07-17 ENCOUNTER — Ambulatory Visit (INDEPENDENT_AMBULATORY_CARE_PROVIDER_SITE_OTHER): Payer: Medicare Other | Admitting: Internal Medicine

## 2021-07-17 ENCOUNTER — Other Ambulatory Visit: Payer: Self-pay

## 2021-07-17 ENCOUNTER — Encounter: Payer: Self-pay | Admitting: Internal Medicine

## 2021-07-17 VITALS — BP 130/76 | HR 65 | Temp 98.6°F | Ht 70.0 in | Wt 175.6 lb

## 2021-07-17 DIAGNOSIS — J069 Acute upper respiratory infection, unspecified: Secondary | ICD-10-CM

## 2021-07-17 DIAGNOSIS — R739 Hyperglycemia, unspecified: Secondary | ICD-10-CM

## 2021-07-17 DIAGNOSIS — M5442 Lumbago with sciatica, left side: Secondary | ICD-10-CM

## 2021-07-17 DIAGNOSIS — E559 Vitamin D deficiency, unspecified: Secondary | ICD-10-CM | POA: Diagnosis not present

## 2021-07-17 DIAGNOSIS — M5441 Lumbago with sciatica, right side: Secondary | ICD-10-CM | POA: Diagnosis not present

## 2021-07-17 DIAGNOSIS — G8929 Other chronic pain: Secondary | ICD-10-CM

## 2021-07-17 MED ORDER — HYDROCODONE BIT-HOMATROP MBR 5-1.5 MG/5ML PO SOLN: 5.0000 mL | Freq: Four times a day (QID) | ORAL | 0 refills | Status: AC | PRN

## 2021-07-17 MED ORDER — CYCLOBENZAPRINE HCL 5 MG PO TABS: 5.0000 mg | ORAL_TABLET | Freq: Three times a day (TID) | ORAL | 1 refills | Status: DC | PRN

## 2021-07-17 MED ORDER — PREDNISONE 10 MG PO TABS: ORAL_TABLET | ORAL | 0 refills | Status: DC

## 2021-07-17 NOTE — Patient Instructions (Signed)
Please take all new medication as prescribed - the cough medicine, prednisone burst and muscle relaxer as needed  Please continue all other medications as before, and refills have been done if requested.  Please have the pharmacy call with any other refills you may need.  Please continue your efforts at being more active, low cholesterol diet, and weight control  Please keep your appointments with your specialists as you may have planned

## 2021-07-17 NOTE — Assessment & Plan Note (Signed)
Last vitamin D Lab Results  Component Value Date   VD25OH 43.38 01/02/2021   Stable, cont oral replacement

## 2021-07-17 NOTE — Progress Notes (Signed)
Patient ID: Luis Wilkinson, male   DOB: Dec 19, 1955, 65 y.o.   MRN: [id number]        Chief Complaint: follow up cough       HPI:  Luis Wilkinson is a 65 y.o. male here with recent hx of covid infection dec 11 with loss of taste and smell, then mild cough 11.25, then felt bad ill nov 26 with fever, cough, myalgias;  seen at [facility], had rocephin IM for ? PNA but cxr negative by report;  since then has improved but still has persistent non prod cough with fatigue, and post left chest pain with cough as well, very annoying, hard to sleep, Pt denies other chest pain, increased sob or doe, wheezing, orthopnea, PND, increased LE swelling, palpitations, dizziness or syncope. Pt denies fever, wt loss, night sweats, loss of appetite, or other constitutional symptoms   Pt denies polydipsia, polyuria, or new focal neuro s/s.  Also incidentally with 3 days osnet left lower back pain but Pt denies bowel or bladder change, fever, wt loss,  worsening LE pain/numbness/weakness, gait change or falls.          Wt Readings from Last 3 Encounters:  07/17/21 175 lb 9.6 oz (79.7 kg)  06/23/21 180 lb 12.8 oz (82 kg)  05/05/21 180 lb 12.8 oz (82 kg)   BP Readings from Last 3 Encounters:  07/17/21 130/76  06/23/21 126/74  05/05/21 126/72         Past Medical History:  Diagnosis Date   Acetabular labrum tear 10/10/2019   Acute sinusitis 02/22/2020   Allergic rhinitis 12/18/2019   Anemia 07/03/2020   Aneurysm, ascending aorta    followed br Dr Hortencia Pilar   Ascending aortic aneurysm 06/11/2015   Body mass index (BMI) 27.0-27.9, adult 03/11/2020   Chest pain of uncertain etiology 3/90/3009   Chronic low back pain 02/22/2020   Colonic mass 10/26/2019   DOE (dyspnea on exertion) 03/16/2019   Onset early June 2020 with assoc chest tightness  - CTa 02/23/19 several segmental and subsegmental PE with micronodular lung dz ? Etiology with no venous dopplers  - 03/16/2019   Walked RA  2 laps @  approx 238f each @ nl pace  stopped due  to  End of study with some sob and chest tightness with sats 98%   - trial off acei and max rx for gerd 03/16/2019  - Echo 03/21/2019   No PH  - 03/30/2019   Walked    Droopy eyelid, left 12/18/2019   Dysphagia 01/02/2021   Eosinophilia 03/31/2019   Onset of asthma as child / [facility] eval around 2015    Essential (primary) hypertension 06/11/2015   Change from ACE inhibitor to ARB 03/16/2019 due to unexplained dry cough and chest tightness > completely resolved as of 05/23/2019    Ground glass opacity present on imaging of lung 01/02/2021   History of kidney stones    History of partial colectomy 12/18/2019   History of prosthetic unicompartmental arthroplasty of right knee 03/02/2016   Hyperglycemia 07/04/2020   Hypertension    Insomnia 12/18/2019   Localized, primary osteoarthritis of hand 02/03/2021   Mass 05/04/2017   Nasal sore 12/18/2019   Nuclear sclerotic cataract of left eye 04/21/2016   Other chronic pain 03/12/2020   Other paralytic strabismus, 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   Overweight 02/03/2021   Pain in joint of right hip 09/22/2018   Pain in limb 02/03/2021   Pain in right knee 02/03/2021   Palpitations 06/11/2015  Presence of right artificial knee joint 03/02/2016   Primary osteoarthritis 02/03/2021   Pulmonary emboli (HCC) 05/24/2019     CTa 02/23/19 pos for PE 02/23/19  Venous dopplers not done, echo ok    RA (rheumatoid arthritis) (HCC)    Rheumatoid arthritis (HCC) 02/03/2021   S/P right unicompartmental knee replacement    06-20-2015  post dislocating plastic    Seronegative rheumatoid arthritis (HCC) 12/18/2019   Status post right unicompartmental knee replacement 03/03/2016   Transient neurological symptoms 03/12/2020   Urticaria 02/03/2021   Vitamin D deficiency 02/03/2021   Past Surgical History:  Procedure Laterality Date   CARDIAC CATHETERIZATION  Feb 2015    [place]   per pt normal coronary arteries   CATARACT EXTRACTION W/ INTRAOCULAR LENS IMPLANT Right Mar 2014   CYSTOSCOPY W/  URETEROSCOPY W/ LITHOTRIPSY     EXCISION SUBDERMAL NECK TUMOR  2010   benign   KNEE ARTHROSCOPY W/ MENISCECTOMY Right 01-09-2015   LEFT ELBOW RECONSTRUCTION  2010   PARTIAL KNEE ARTHROPLASTY Right 08/08/2015   Procedure: RIGHT UNICOMPARTMENTAL KNEE REVISION PLASTIC;  Surgeon: Matthew Olin, MD;  Location: [facility];  Service: Orthopedics;  Laterality: Right;   PARTIAL KNEE ARTHROPLASTY Right 03/02/2016   Procedure: UNICOMPARTMENTAL RIGHT KNEE REVISION;  Surgeon: Matthew Olin, MD;  Location: [facility];  Service: Orthopedics;  Laterality: Right;   REPLACEMENT UNICONDYLAR JOINT KNEE Right 06-20-2015   RIGHT/LEFT HEART CATH AND CORONARY ANGIOGRAPHY N/A 01/30/2021   Procedure: RIGHT/LEFT HEART CATH AND CORONARY ANGIOGRAPHY;  Surgeon: Smith, Henry W, MD;  Location: [facility];  Service: Cardiovascular;  Laterality: N/A;   STRABISMUS SURGERY Left x6   last one --Age 20    reports that he quit smoking about 27 years ago. His smoking use included cigarettes. He started smoking about 50 years ago. He has a 5.00 pack-year smoking history. He has never used smokeless tobacco. He reports that he does not drink alcohol and does not use drugs. family history includes Congestive Heart Failure in his father and mother. Allergies  Allergen Reactions   Dilaudid [Hydromorphone] Shortness Of Breath and Other (See Comments)    Chest tight   Hydroxychloroquine Sulfate Swelling, Itching and Other (See Comments)   Morphine Shortness Of Breath    SEVERE   Hydroxychloroquine Rash   Infliximab Rash and Other (See Comments)    (REMICADE)SEVERE   Current Outpatient Medications on File Prior to Visit  Medication Sig Dispense Refill   albuterol (VENTOLIN HFA) 108 (90 Base) MCG/ACT inhaler Inhale 2 puffs into the lungs every 6 (six) hours as needed for wheezing or shortness of breath. 8 g 0   aspirin EC 81 MG tablet Take 1 tablet (81 mg total) by mouth daily. Swallow whole. 90 tablet 3    atorvastatin (LIPITOR) 10 MG tablet Take 10 mg by mouth daily.     Certolizumab Pegol (CIMZIA STARTER KIT [place]) Inject 2 Syringes into the skin every 28 (twenty-eight) days.     DULoxetine (CYMBALTA) 60 MG capsule TAKE 1 CAPSULE DAILY 90 capsule 2   ergocalciferol (VITAMIN D2) 1.25 MG (50000 UT) capsule Take 50,000 Units by mouth every Friday.     famotidine (PEPCID) 20 MG tablet Take 20 mg by mouth daily. After supper     flecainide (TAMBOCOR) 50 MG tablet Take 1 tablet (50 mg total) by mouth 2 (two) times daily. 180 tablet 3   Glucosamine-Chondroit-Vit C-Mn (GLUCOSAMINE 1500 COMPLEX) CAPS Take 1 capsule by mouth daily.       ibuprofen (ADVIL) 200 MG tablet Take 200 mg by mouth as needed for pain.     leflunomide (ARAVA) 20 MG tablet Take 20 mg by mouth daily.     loratadine-pseudoephedrine (CLARITIN-D 24-HOUR) 10-240 MG 24 hr tablet Take 1 tablet by mouth daily as needed for allergies.     metoprolol succinate (TOPROL-XL) 100 MG 24 hr tablet Take 1 tablet (100 mg total) by mouth daily. Take with or immediately following a meal. 90 tablet 2   Multiple Vitamin (MULTIVITAMIN WITH MINERALS) TABS tablet Take 1 tablet by mouth in the morning.     pantoprazole (PROTONIX) 40 MG tablet TAKE 30-60 MINUTES BEFORE  YOUR FIRST AND LAST MEALS  OF THE DAY 180 tablet 3   predniSONE (DELTASONE) 5 MG tablet Take 5 mg by mouth in the morning.     RESTASIS 0.05 % ophthalmic emulsion Place 1 drop into both eyes 2 (two) times daily.     tadalafil (CIALIS) 20 MG tablet Take 0.5-1 tablets (10-20 mg total) by mouth every other day as needed for erectile dysfunction. 10 tablet 11   telmisartan-hydrochlorothiazide (MICARDIS HCT) 80-25 MG tablet TAKE 1 TABLET DAILY 90 tablet 0   temazepam (RESTORIL) 15 MG capsule TAKE 1 CAPSULE AT BEDTIME  AS NEEDED FOR SLEEP 90 capsule 1   No current facility-administered medications on file prior to visit.        ROS:  All others reviewed and negative.  Objective        PE:  BP 130/76  (BP Location: Right Arm, Patient Position: Sitting, Cuff Size: Normal)   Pulse 65   Temp 98.6 F (37 C) (Oral)   Ht 5' 10" (1.778 m)   Wt 175 lb 9.6 oz (79.7 kg)   SpO2 99%   BMI 25.20 kg/m                 Constitutional: Pt appears in NAD               HENT: Head: NCAT.                Right Ear: External ear normal.                 Left Ear: External ear normal.                Eyes: . Pupils are equal, round, and reactive to light. Conjunctivae and EOM are normal               Nose: without d/c or deformity               Neck: Neck supple. Gross normal ROM               Cardiovascular: Normal rate and regular rhythm.                 Pulmonary/Chest: Effort normal and breath sounds without rales or wheezing.                Abd:  Soft, NT, ND, + BS, no organomegaly; ls s[pine nontender but has left lubmar paravertebral tender spasm               Neurological: Pt is alert. At baseline orientation, motor grossly intact               Skin: Skin is warm. No rashes, no other new lesions, LE edema - none                 Psychiatric: Pt behavior is normal without agitation   Micro: none  Cardiac tracings I have personally interpreted today:  none  Pertinent Radiological findings (summarize): none   Lab Results  Component Value Date   WBC 5.8 03/14/2021   HGB 13.5 03/14/2021   HCT 39.6 03/14/2021   PLT 257.0 03/14/2021   GLUCOSE 101 (H) 03/18/2021   CHOL 170 01/02/2021   TRIG 170.0 (H) 01/02/2021   HDL 52.20 01/02/2021   LDLDIRECT 71.0 12/18/2019   LDLCALC 83 01/02/2021   ALT 14 01/02/2021   AST 21 01/02/2021   NA 138 03/18/2021   K 4.3 03/18/2021   CL 99 03/18/2021   CREATININE 1.06 03/18/2021   BUN 19 03/18/2021   CO2 24 03/18/2021   TSH 1.05 03/14/2021   PSA 0.55 01/02/2021   HGBA1C 5.9 01/02/2021   Assessment/Plan:  Luis Wilkinson is a 65 y.o. White or Caucasian [1] male with  has a past medical history of Acetabular labrum tear (10/10/2019), Acute sinusitis (02/22/2020),  Allergic rhinitis (12/18/2019), Anemia (07/03/2020), Aneurysm, ascending aorta, Ascending aortic aneurysm (06/11/2015), Body mass index (BMI) 27.0-27.9, adult (03/11/2020), Chest pain of uncertain etiology (01/24/2021), Chronic low back pain (02/22/2020), Colonic mass (10/26/2019), DOE (dyspnea on exertion) (03/16/2019), Droopy eyelid, left (12/18/2019), Dysphagia (01/02/2021), Eosinophilia (03/31/2019), Essential (primary) hypertension (06/11/2015), Ground glass opacity present on imaging of lung (01/02/2021), History of kidney stones, History of partial colectomy (12/18/2019), History of prosthetic unicompartmental arthroplasty of right knee (03/02/2016), Hyperglycemia (07/04/2020), Hypertension, Insomnia (12/18/2019), Localized, primary osteoarthritis of hand (02/03/2021), Mass (05/04/2017), Nasal sore (12/18/2019), Nuclear sclerotic cataract of left eye (04/21/2016), Other chronic pain (03/12/2020), Other paralytic strabismus, left eye (12/18/2019), Overweight (02/03/2021), Pain in joint of right hip (09/22/2018), Pain in limb (02/03/2021), Pain in right knee (02/03/2021), Palpitations (06/11/2015), Presence of right artificial knee joint (03/02/2016), Primary osteoarthritis (02/03/2021), Pulmonary emboli (HCC) (05/24/2019), RA (rheumatoid arthritis) (HCC), Rheumatoid arthritis (HCC) (02/03/2021), S/P right unicompartmental knee replacement, Seronegative rheumatoid arthritis (HCC) (12/18/2019), Status post right unicompartmental knee replacement (03/03/2016), Transient neurological symptoms (03/12/2020), Urticaria (02/03/2021), and Vitamin D deficiency (02/03/2021).  Vitamin D deficiency Last vitamin D Lab Results  Component Value Date   VD25OH 43.38 01/02/2021   Stable, cont oral replacement   Hyperglycemia Lab Results  Component Value Date   HGBA1C 5.9 01/02/2021   Stable, pt to continue current medical treatment  - diet   URI (upper respiratory infection) Resolving likely viral illness, with unusual persistent cough - for  predpac asd, to otherwise continue to follow with supportive care, declines cxr  Chronic low back pain With acute flare uncontrolled on chronic  - for flexeril prn left lumbar msk spasm  Followup: Return in about 19 days (around 08/05/2021).  James John, MD 07/22/2021 10:12 PM  [facility]  

## 2021-07-17 NOTE — Assessment & Plan Note (Signed)
Lab Results  Component Value Date   HGBA1C 5.9 01/02/2021   Stable, pt to continue current medical treatment  - diet

## 2021-07-18 ENCOUNTER — Ambulatory Visit: Payer: Medicare Other | Admitting: Internal Medicine

## 2021-07-22 ENCOUNTER — Encounter: Payer: Self-pay | Admitting: Internal Medicine

## 2021-07-22 NOTE — Assessment & Plan Note (Addendum)
Resolving likely viral illness, with unusual persistent cough - for predpac asd, to otherwise continue to follow with supportive care, declines cxr

## 2021-07-22 NOTE — Assessment & Plan Note (Signed)
With acute flare uncontrolled on chronic  - for flexeril prn left lumbar msk spasm

## 2021-07-31 IMAGING — MR MR LUMBAR SPINE W/O CM
4 of 5 series · 25 of 48 positions shown · non-contrast
Comparison: None.

CLINICAL DATA: Low back pain with numbness and weakness in the
legs.

EXAM:
MRI LUMBAR SPINE WITHOUT CONTRAST
TECHNIQUE: Multiplanar, multisequence MR imaging of the lumbar spine was
performed. No intravenous contrast was administered.

[Series 4: T2 post-contrast · sagittal · 4.0mm · 0.53mm/px · 6 of 15 slices shown]
[im 1/15]
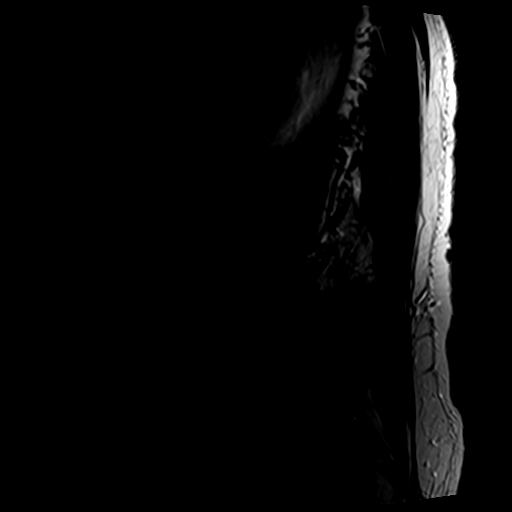
[im 3/15]
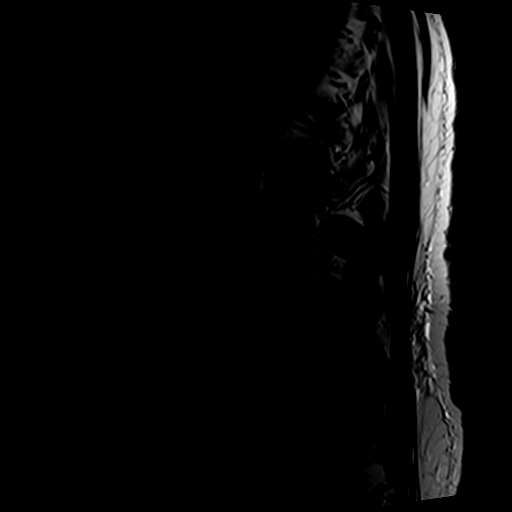
[im 6/15]
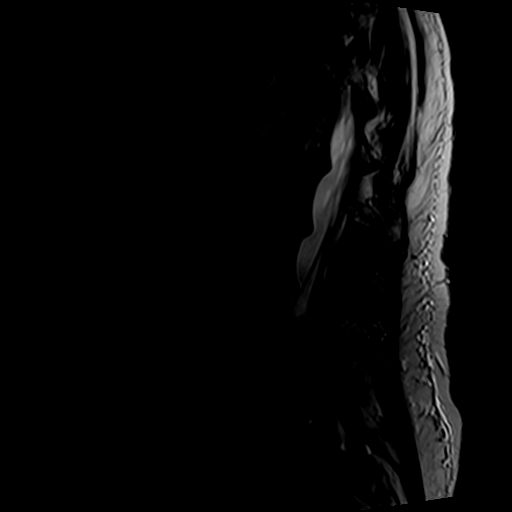
[im 9/15]
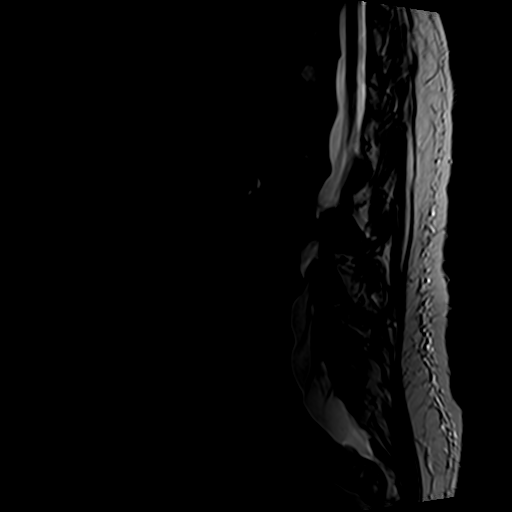
[im 12/15]
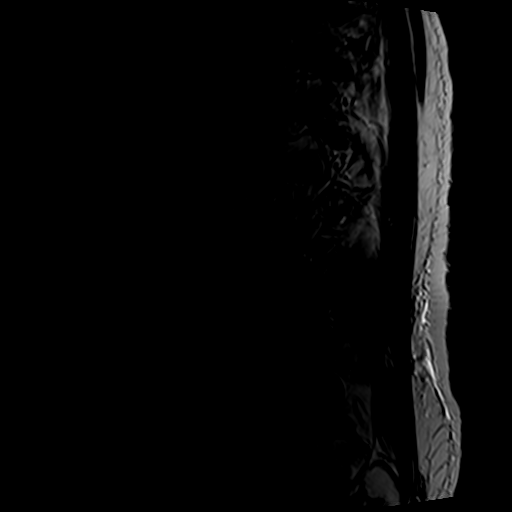
[im 15/15]
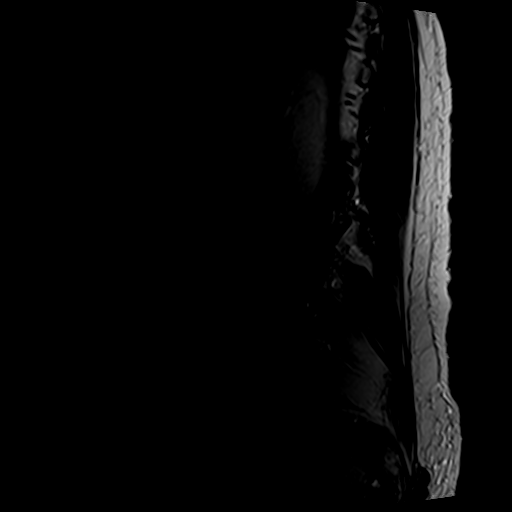

[Series 6: T1 · sagittal · 4.0mm · 0.53mm/px · 6 of 15 slices shown (1 of 2)]
[im 1/15]
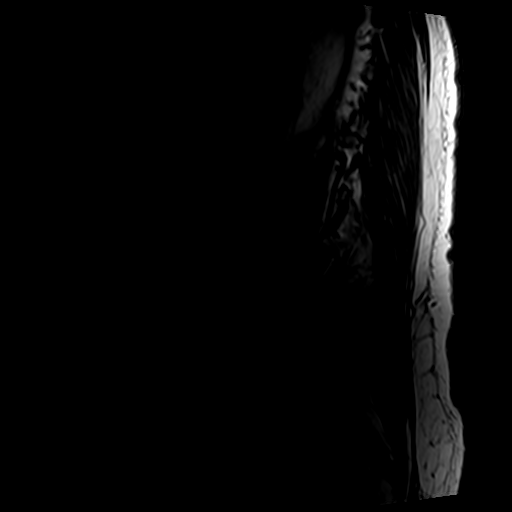
[im 3/15]
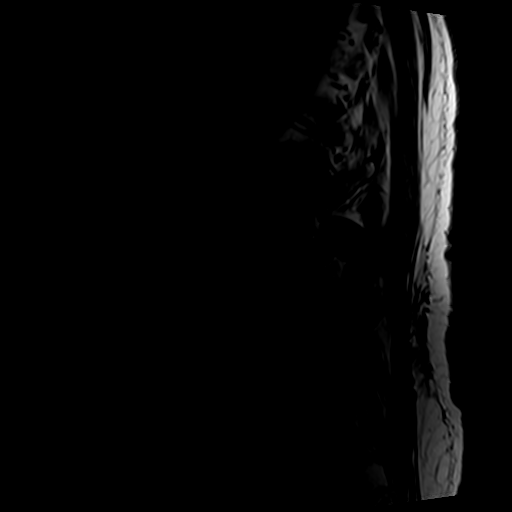
[im 6/15]
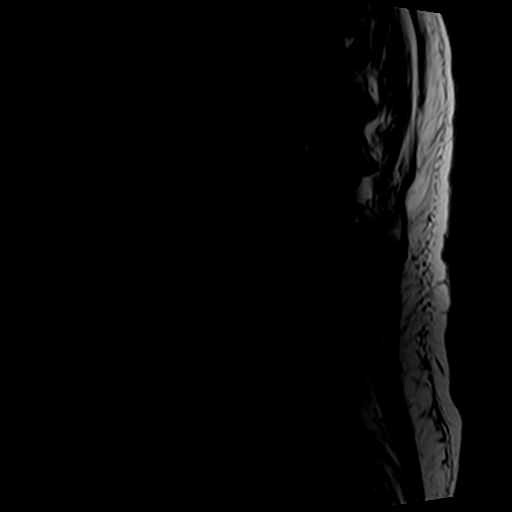
[im 9/15]
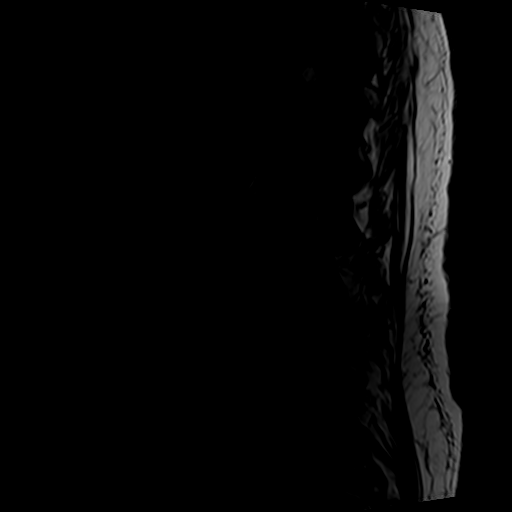
[im 12/15]
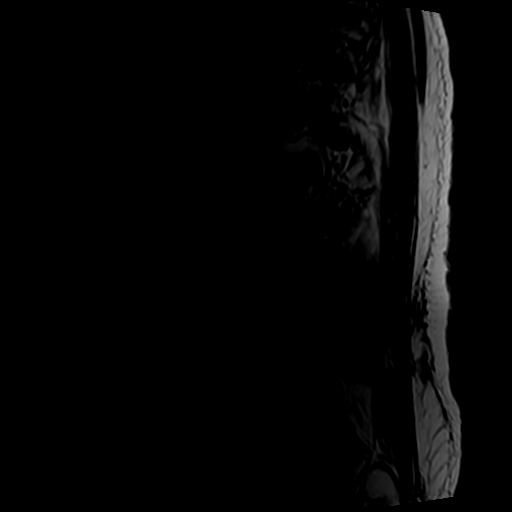
[im 15/15]
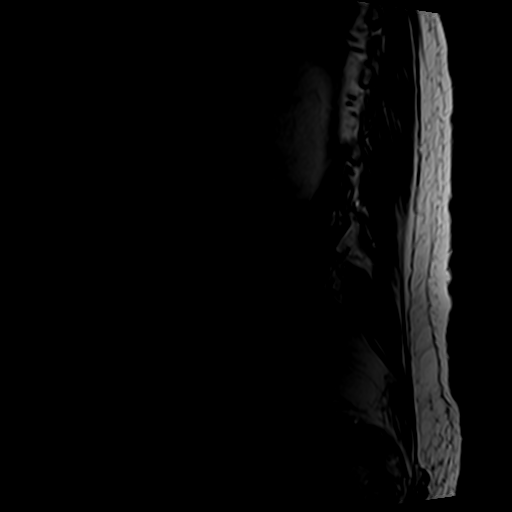

[Series 7: T1 · axial · 4.0mm · 0.35mm/px · z∈[-100,+81]mm · 4 of 39 slices shown (2 of 2)]
[im 1/39]
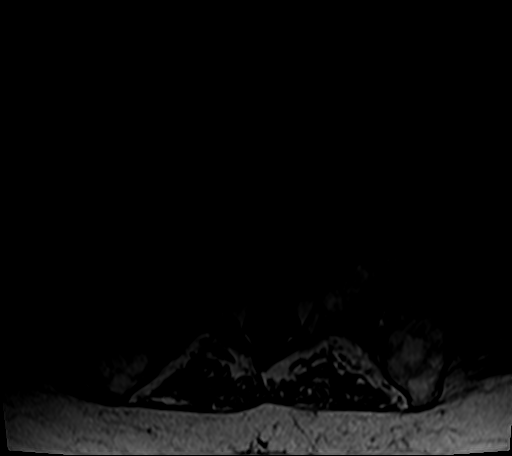
[im 6/39]
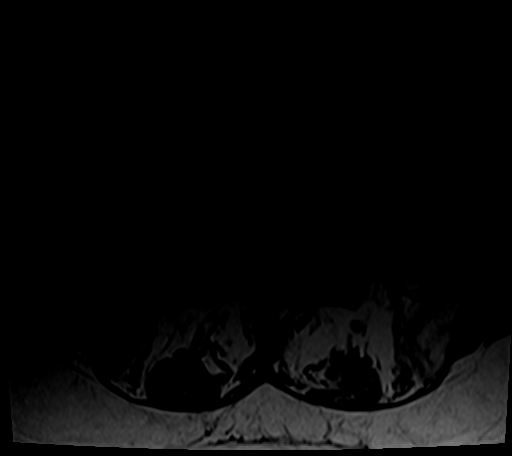
[im 20/39]
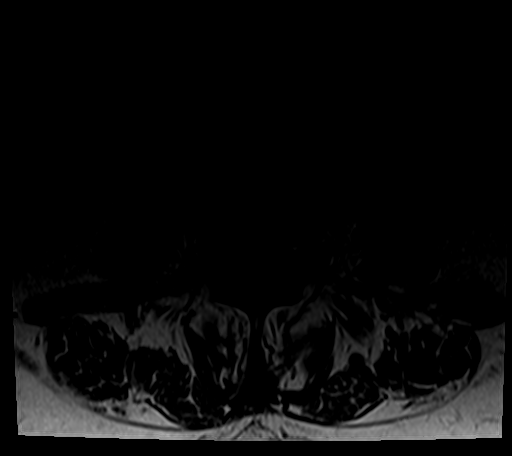
[im 33/39]
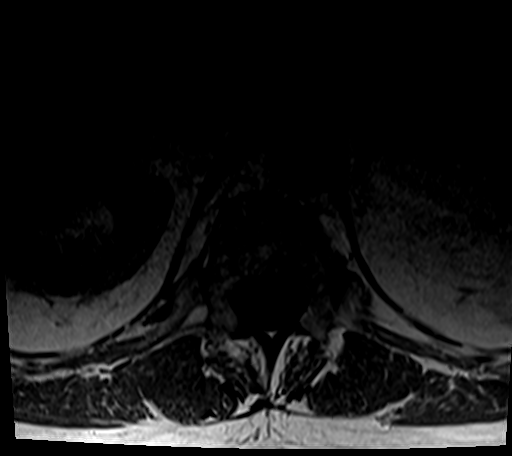

[Series 8: T2 · axial · 4.0mm · 0.70mm/px · z∈[-100,+113]mm · 9 of 39 slices shown]
[im 1/39]
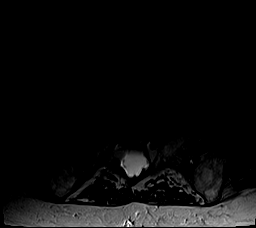
[im 6/39]
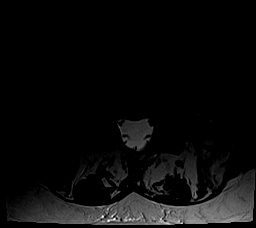
[im 11/39]
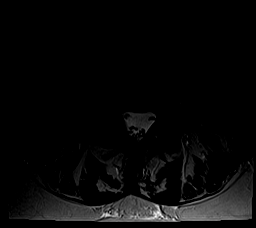
[im 17/39]
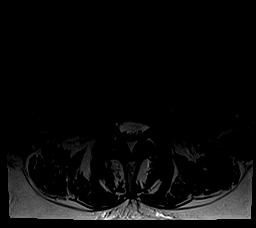
[im 20/39]
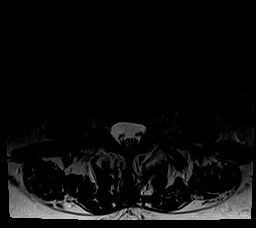
[im 22/39]
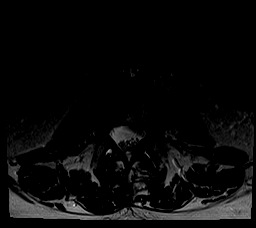
[im 28/39]
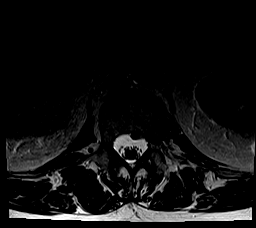
[im 33/39]
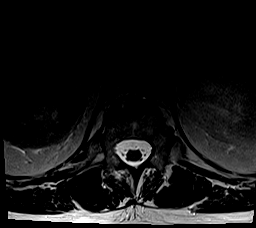
[im 39/39]
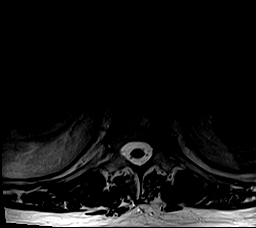

[25 of 48 positions shown; findings below may reference images not displayed]

FINDINGS: Segmentation:  5 lumbar type vertebral bodies assumed.

Alignment:  Curvature convex to the right with the apex at L3.

Vertebrae: No fracture or primary bone lesion. Discogenic marrow
edema on the left at L2 could be associated with back pain.

Conus medullaris and cauda equina: Conus extends to the L2-3 level.
Conus and cauda equina appear normal.

Paraspinal and other soft tissues: Negative

Disc levels:

T12-L1: Normal

L1-2: Shallow left paracentral disc herniation with a small fragment
migrated caudally to the left of midline. This indents the thecal
sac slightly but does not appear to cause any sort of neural
compression.

L2-3: Disc degeneration more pronounced on the left. Discogenic
edema of the endplates as noted above. Endplate osteophytes and
bulging of the disc more towards the left. Mild facet and
ligamentous prominence on the left. Stenosis of the left lateral
recess and intervertebral foramen on the left that could possibly
cause left-sided neural compression.

L3-4: Endplate osteophytes and bulging of the disc more prominent
towards the left. Mild facet hypertrophy on the left. Mild stenosis
of the left lateral recess and intervertebral foramen on the left.
Some potential for left-sided neural compression.

L4-5: Right-sided predominant disc degeneration with endplate
osteophytes and bulging of the disc. Facet degeneration and
hypertrophy right more than left. Small synovial cyst associated
with the facet on the right. Right lateral recess and foraminal
stenosis that could cause right-sided neural compression.

L5-S1: Endplate osteophytes and mild bulging of the disc. Mild facet
hypertrophy. No compressive canal or foraminal narrowing.
IMPRESSION: Spinal curvature convex to the right with the apex at L2-3.

Left-sided predominant degenerative changes at L2-3 and L3-4 with
lateral recess and foraminal narrowing that could cause left-sided
neural compression. Discogenic endplate edema on the left at L2 that
could be associated with back pain.

Right-sided predominant disc degeneration and facet degeneration at
L4-5, with right lateral recess and foraminal stenosis that could
cause right-sided neural compression.

The degenerative disc disease and degenerative facet disease in
general could cause regional pain.

## 2021-08-01 ENCOUNTER — Ambulatory Visit: Payer: Medicare Other

## 2021-08-05 ENCOUNTER — Ambulatory Visit: Payer: Medicare Other | Admitting: Internal Medicine

## 2021-08-08 ENCOUNTER — Telehealth: Payer: Self-pay | Admitting: Internal Medicine

## 2021-08-08 ENCOUNTER — Ambulatory Visit: Payer: Medicare Other | Admitting: Internal Medicine

## 2021-08-12 ENCOUNTER — Ambulatory Visit (INDEPENDENT_AMBULATORY_CARE_PROVIDER_SITE_OTHER): Payer: Medicare Other

## 2021-08-12 ENCOUNTER — Other Ambulatory Visit: Payer: Self-pay

## 2021-08-12 DIAGNOSIS — Z0000 Encounter for general adult medical examination without abnormal findings: Secondary | ICD-10-CM

## 2021-08-12 NOTE — Progress Notes (Signed)
I connected with Luis Wilkinson today by telephone and verified that I am speaking with the correct person using two identifiers. Location patient: home Location provider: work Persons participating in the virtual visit: patient, provider.   I discussed the limitations, risks, security and privacy concerns of performing an evaluation and management service by telephone and the availability of in person appointments. I also discussed with the patient that there may be a patient responsible charge related to this service. The patient expressed understanding and verbally consented to this telephonic visit.    Interactive audio and video telecommunications were attempted between this provider and patient, however failed, due to patient having technical difficulties OR patient did not have access to video capability.  We continued and completed visit with audio only.  Some vital signs may be absent or patient reported.   Time Spent with patient on telephone encounter: 40 minutes  Subjective:   Luis Wilkinson is a 66 y.o. male who presents for an Initial Medicare Annual Wellness Visit.  Review of Systems     Cardiac Risk Factors include: advanced age (>72mn, >>11women);male gender;hypertension;family history of premature cardiovascular disease     Objective:    Today's Vitals   08/12/21 1017  PainSc: 6    There is no height or weight on file to calculate BMI.  Advanced Directives 08/12/2021 01/30/2021 01/12/2019 01/06/2019 03/03/2016 02/27/2016  Does Patient Have a Medical Advance Directive? Yes Yes No No No Yes  Type of Advance Directive Living will;[place]care Power [place] - - - -  Does patient want to make changes to medical advance directive? No - Patient declined No - Patient declined - - - -  Copy of [place]? No - copy requested No - copy requested - - - No - copy requested  Would patient like information on  creating a medical advance directive? - - - No - Guardian declined - -    Current Medications (verified) Outpatient Encounter Medications as of 08/12/2021  Medication Sig   albuterol (VENTOLIN HFA) 108 (90 Base) MCG/ACT inhaler Inhale 2 puffs into the lungs every 6 (six) hours as needed for wheezing or shortness of breath.   aspirin EC 81 MG tablet Take 1 tablet (81 mg total) by mouth daily. Swallow whole.   atorvastatin (LIPITOR) 10 MG tablet Take 10 mg by mouth daily.   Certolizumab Pegol (CIMZIA STARTER KIT [place]) Inject 2 Syringes into the skin every 28 (twenty-eight) days.   cyclobenzaprine (FLEXERIL) 5 MG tablet Take 1 tablet (5 mg total) by mouth 3 (three) times daily as needed for muscle spasms.   DULoxetine (CYMBALTA) 60 MG capsule TAKE 1 CAPSULE DAILY   ergocalciferol (VITAMIN D2) 1.25 MG (50000 UT) capsule Take 50,000 Units by mouth every Friday.   famotidine (PEPCID) 20 MG tablet Take 20 mg by mouth daily. After supper   flecainide (TAMBOCOR) 50 MG tablet Take 1 tablet (50 mg total) by mouth 2 (two) times daily.   Glucosamine-Chondroit-Vit C-Mn (GLUCOSAMINE 1500 COMPLEX) CAPS Take 1 capsule by mouth daily.   ibuprofen (ADVIL) 200 MG tablet Take 200 mg by mouth as needed for pain.   leflunomide (ARAVA) 20 MG tablet Take 20 mg by mouth daily.   loratadine-pseudoephedrine (CLARITIN-D 24-HOUR) 10-240 MG 24 hr tablet Take 1 tablet by mouth daily as needed for allergies.   metoprolol succinate (TOPROL-XL) 100 MG 24 hr tablet Take 1 tablet (100 mg total) by mouth daily. Take with or  immediately following a meal.   Multiple Vitamin (MULTIVITAMIN WITH MINERALS) TABS tablet Take 1 tablet by mouth in the morning.   pantoprazole (PROTONIX) 40 MG tablet TAKE 30-60 MINUTES BEFORE  YOUR FIRST AND LAST MEALS  OF THE DAY   predniSONE (DELTASONE) 10 MG tablet 3 tabs by mouth per day for 3 days,2tabs per day for 3 days,1tab per day for 3 days   predniSONE (DELTASONE) 5 MG tablet Take 5 mg by mouth in the  morning.   RESTASIS 0.05 % ophthalmic emulsion Place 1 drop into both eyes 2 (two) times daily.   tadalafil (CIALIS) 20 MG tablet Take 0.5-1 tablets (10-20 mg total) by mouth every other day as needed for erectile dysfunction.   telmisartan-hydrochlorothiazide (MICARDIS HCT) 80-25 MG tablet TAKE 1 TABLET DAILY   temazepam (RESTORIL) 15 MG capsule TAKE 1 CAPSULE AT BEDTIME  AS NEEDED FOR SLEEP   No facility-administered encounter medications on file as of 08/12/2021.    Allergies (verified) Dilaudid [hydromorphone], Hydroxychloroquine sulfate, Morphine, Hydroxychloroquine, and Infliximab   History: Past Medical History:  Diagnosis Date   Acetabular labrum tear 10/10/2019   Acute sinusitis 02/22/2020   Allergic rhinitis 12/18/2019   Anemia 07/03/2020   Aneurysm, ascending aorta    followed br Dr Hortencia Pilar   Ascending aortic aneurysm 06/11/2015   Body mass index (BMI) 27.0-27.9, adult 03/11/2020   Chest pain of uncertain etiology 8/84/1660   Chronic low back pain 02/22/2020   Colonic mass 10/26/2019   DOE (dyspnea on exertion) 03/16/2019   Onset early June 2020 with assoc chest tightness  - CTa 02/23/19 several segmental and subsegmental PE with micronodular lung dz ? Etiology with no venous dopplers  - 03/16/2019   Walked RA  2 laps @  approx 242f each @ nl pace  stopped due to  End of study with some sob and chest tightness with sats 98%   - trial off acei and max rx for gerd 03/16/2019  - Echo 03/21/2019   No PH  - 03/30/2019   Walked    Droopy eyelid, left 12/18/2019   Dysphagia 01/02/2021   Eosinophilia 03/31/2019   Onset of asthma as child / [facility] eval around 2015    Essential (primary) hypertension 06/11/2015   Change from ACE inhibitor to ARB 03/16/2019 due to unexplained dry cough and chest tightness > completely resolved as of 05/23/2019    Ground glass opacity present on imaging of lung 01/02/2021   History of kidney stones    History of partial colectomy 12/18/2019   History of prosthetic  unicompartmental arthroplasty of right knee 03/02/2016   Hyperglycemia 07/04/2020   Hypertension    Insomnia 12/18/2019   Localized, primary osteoarthritis of hand 02/03/2021   Mass 05/04/2017   Nasal sore 12/18/2019   Nuclear sclerotic cataract of left eye 04/21/2016   Other chronic pain 03/12/2020   Other paralytic strabismus, 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   Overweight 02/03/2021   Pain in joint of right hip 09/22/2018   Pain in limb 02/03/2021   Pain in right knee 02/03/2021   Palpitations 06/11/2015   Presence of right artificial knee joint 03/02/2016   Primary osteoarthritis 02/03/2021   Pulmonary emboli ([place] 05/24/2019     CTa 02/23/19 pos for PE 02/23/19  Venous dopplers not done, echo ok    RA (rheumatoid arthritis) ([place]    Rheumatoid arthritis ([place] 02/03/2021   S/P right unicompartmental knee replacement    06-20-2015  post dislocating plastic    Seronegative rheumatoid arthritis ([facility]  12/18/2019   Status post right unicompartmental knee replacement 03/03/2016   Transient neurological symptoms 03/12/2020   Urticaria 02/03/2021   Vitamin D deficiency 02/03/2021   Past Surgical History:  Procedure Laterality Date   CARDIAC CATHETERIZATION  Feb 2015    [place]   per pt normal coronary arteries   CATARACT EXTRACTION W/ INTRAOCULAR LENS IMPLANT Right Mar 2014   CYSTOSCOPY W/ URETEROSCOPY W/ LITHOTRIPSY     EXCISION SUBDERMAL NECK TUMOR  2010   benign   KNEE ARTHROSCOPY W/ MENISCECTOMY Right 01-09-2015   LEFT ELBOW RECONSTRUCTION  2010   PARTIAL KNEE ARTHROPLASTY Right 08/08/2015   Procedure: RIGHT UNICOMPARTMENTAL KNEE REVISION PLASTIC;  Surgeon: Paralee Cancel, MD;  Location: [place];  Service: Orthopedics;  Laterality: Right;   PARTIAL KNEE ARTHROPLASTY Right 03/02/2016   Procedure: UNICOMPARTMENTAL RIGHT KNEE REVISION;  Surgeon: Paralee Cancel, MD;  Location: [facility];  Service: Orthopedics;  Laterality: Right;   REPLACEMENT UNICONDYLAR JOINT KNEE Right 06-20-2015   RIGHT/LEFT HEART  CATH AND CORONARY ANGIOGRAPHY N/A 01/30/2021   Procedure: RIGHT/LEFT HEART CATH AND CORONARY ANGIOGRAPHY;  Surgeon: Belva Crome, MD;  Location: [facility];  Service: Cardiovascular;  Laterality: N/A;   STRABISMUS SURGERY Left x6   last one --Age 86   Family History  Problem Relation Age of Onset   Congestive Heart Failure Mother    Congestive Heart Failure Father    Social History   Socioeconomic History   Marital status: Married    Spouse name: Not on file   Number of children: Not on file   Years of education: Not on file   Highest education level: Not on file  Occupational History   Not on file  Tobacco Use   Smoking status: Former    Packs/day: 0.50    Years: 10.00    Pack years: 5.00    Types: Cigarettes    Start date: 59    Quit date: 07/19/1994    Years since quitting: 27.0   Smokeless tobacco: Never  Substance and Sexual Activity   Alcohol use: No   Drug use: No   Sexual activity: Not on file  Other Topics Concern   Not on file  Social History Narrative   Not on file   Social Determinants of Health   Financial Resource Strain: Low Risk    Difficulty of Paying Living Expenses: Not hard at all  Food Insecurity: No Food Insecurity   Worried About Charity fundraiser in the Last Year: Never true   Equality in the Last Year: Never true  Transportation Needs: No Transportation Needs   Lack of Transportation (Medical): No   Lack of Transportation (Non-Medical): No  Physical Activity: Inactive   Days of Exercise per Week: 0 days   Minutes of Exercise per Session: 0 min  Stress: No Stress Concern Present   Feeling of Stress : Not at all  Social Connections: Socially Integrated   Frequency of Communication with Friends and Family: More than three times a week   Frequency of Social Gatherings with Friends and Family: More than three times a week   Attends Religious Services: More than 4 times per year   Active Member of [employer] or Organizations:  Yes   Attends Music therapist: More than 4 times per year   Marital Status: Married    Tobacco Counseling Counseling given: Not Answered   Clinical Intake:  Pre-visit preparation completed: Yes  Pain : 0-10  Pain Score: 6  Pain Type: Chronic pain Pain Location: Back Pain Orientation: Mid, Lower Pain Descriptors / Indicators: Discomfort, Dull, Constant Pain Onset: More than a month ago Pain Frequency: Constant Pain Relieving Factors: Tylenol, Muscle relaxers Effect of Pain on Daily Activities: Pain can diminish job performance, lower motivation to exercise, and prevent you from completing daily tasks. Pain produces disability and affects the quality of life.  Pain Relieving Factors: Tylenol, Muscle relaxers  Nutritional Risks: None Diabetes: No  How often do you need to have someone help you when you read instructions, pamphlets, or other written materials from your doctor or pharmacy?: 1 - Never What is the last grade level you completed in school?: [place]  Diabetic? no  Interpreter Needed?: No  Information entered by :: Lisette Abu, LPN   Activities of Daily Living In your present state of health, do you have any difficulty performing the following activities: 08/12/2021  Hearing? N  Vision? N  Difficulty concentrating or making decisions? N  Walking or climbing stairs? N  Dressing or bathing? N  Doing errands, shopping? N  Preparing Food and eating ? N  Using the Toilet? N  In the past six months, have you accidently leaked urine? N  Do you have problems with loss of bowel control? N  Managing your Medications? N  Managing your Finances? N  Housekeeping or managing your Housekeeping? N  Some recent data might be hidden    Patient Care Team: Biagio Borg, MD as PCP - General (Internal Medicine) Berniece Salines, DO as PCP - Cardiology (Cardiology) Lynnell Dike, OD as Consulting Physician (Optometry)  Indicate any  recent Medical Services you may have received from other than [facility] providers in the past year (date may be approximate).     Assessment:   This is a routine wellness examination for Luis Wilkinson.  Hearing/Vision screen Hearing Screening - Comments:: Patient denied any hearing difficulty.   No hearing aids.  Vision Screening - Comments:: Patient wears reading glasses.  Eye exam done annually by: Dr. Cleon Gustin  Dietary issues and exercise activities discussed: Current Exercise Habits: The patient does not participate in regular exercise at present, Exercise limited by: orthopedic condition(s);cardiac condition(s);respiratory conditions(s)   Goals Addressed               This Visit's Progress     Patient Stated (pt-stated)        My goal is to relieve my back pain.      Depression Screen PHQ 2/9 Scores 08/12/2021 04/30/2021 01/02/2021 12/18/2019 12/18/2019  PHQ - 2 Score 0 0 1 0 0    Fall Risk Fall Risk  08/12/2021 04/30/2021 12/18/2019 12/18/2019  Falls in the past year? 0 0 0 0  Number falls in past yr: 0 0 - 0  Injury with Fall? 0 0 - 0  Risk for fall due to : No Fall Risks - - No Fall Risks  Follow up - - - Falls evaluation completed    FALL RISK PREVENTION PERTAINING TO THE HOME:  Any stairs in or around the home? No  If so, are there any without handrails? No  Home free of loose throw rugs in walkways, pet beds, electrical cords, etc? Yes  Adequate lighting in your home to reduce risk of falls? Yes   ASSISTIVE DEVICES UTILIZED TO PREVENT FALLS:  Life alert? No  Use of a cane, walker or w/c? No  Grab bars in the bathroom? No  Shower chair or  bench in shower? No  Elevated toilet seat or a handicapped toilet? No   TIMED UP AND GO:  Was the test performed? No .  Length of time to ambulate 10 feet: n/a sec.   Gait steady and fast without use of assistive device  Cognitive Function: Normal cognitive status assessed by direct observation by this Nurse Health Advisor. No  abnormalities found.          Immunizations  There is no immunization history on file for this patient.  TDAP status: declined  Flu Vaccine status: Declined, Education has been provided regarding the importance of this vaccine but patient still declined. Advised may receive this vaccine at local pharmacy or Health Dept. Aware to provide a copy of the vaccination record if obtained from local pharmacy or Health Dept. Verbalized acceptance and understanding.  Pneumococcal vaccine status: Declined,  Education has been provided regarding the importance of this vaccine but patient still declined. Advised may receive this vaccine at local pharmacy or Health Dept. Aware to provide a copy of the vaccination record if obtained from local pharmacy or Health Dept. Verbalized acceptance and understanding.   Covid-19 vaccine status: Declined, Education has been provided regarding the importance of this vaccine but patient still declined. Advised may receive this vaccine at local pharmacy or Health Dept.or vaccine clinic. Aware to provide a copy of the vaccination record if obtained from local pharmacy or Health Dept. Verbalized acceptance and understanding.  Qualifies for Shingles Vaccine? Yes   Zostavax completed No   Shingrix Completed?: No.    Education has been provided regarding the importance of this vaccine. Patient has been advised to call insurance company to determine out of pocket expense if they have not yet received this vaccine. Advised may also receive vaccine at local pharmacy or Health Dept. Verbalized acceptance and understanding.  Screening Tests Health Maintenance  Topic Date Due   COVID-19 Vaccine (1) Never done   Pneumococcal Vaccine 3-67 Years old (1 - PCV) Never done   Hepatitis C Screening  Never done   TETANUS/TDAP  Never done   Zoster Vaccines- Shingrix (1 of 2) Never done   INFLUENZA VACCINE  Never done   COLONOSCOPY (Pts 45-11yr Insurance coverage will need to be  confirmed)  10/05/2029   HIV Screening  Completed   HPV VACCINES  Aged Out    Health Maintenance  Health Maintenance Due  Topic Date Due   COVID-19 Vaccine (1) Never done   Pneumococcal Vaccine 171632Years old (1 - PCV) Never done   Hepatitis C Screening  Never done   TETANUS/TDAP  Never done   Zoster Vaccines- Shingrix (1 of 2) Never done   INFLUENZA VACCINE  Never done    Colorectal cancer screening: Type of screening: Colonoscopy. Completed 10/06/2019. Repeat every 10 years  Lung Cancer Screening: (Low Dose CT Chest recommended if Age 65-80years, 30 pack-year currently smoking OR have quit w/in 15years.) does not qualify.   Lung Cancer Screening Referral: no  Additional Screening:  Hepatitis C Screening: does qualify; Completed no  Vision Screening: Recommended annual ophthalmology exams for early detection of glaucoma and other disorders of the eye. Is the patient up to date with their annual eye exam?  Yes  Who is the provider or what is the name of the office in which the patient attends annual eye exams? Dr. RCleon GustinIf pt is not established with a provider, would they like to be referred to a provider to establish care? No .  Dental Screening: Recommended annual dental exams for proper oral hygiene  Community Resource Referral / Chronic Care Management: CRR required this visit?  No   CCM required this visit?  No      Plan:     I have personally reviewed and noted the following in the patients chart:   Medical and social history Use of alcohol, tobacco or illicit drugs  Current medications and supplements including opioid prescriptions. Patient is not currently taking opioid prescriptions. Functional ability and status Nutritional status Physical activity Advanced directives List of other physicians Hospitalizations, surgeries, and ER visits in previous 12 months Vitals Screenings to include cognitive, depression, and falls Referrals and  appointments  In addition, I have reviewed and discussed with patient certain preventive protocols, quality metrics, and best practice recommendations. A written personalized care plan for preventive services as well as general preventive health recommendations were provided to patient.     [name], LPN   12/11/3012   Nurse Notes:  Patient is cogitatively intact. There were no vitals filed for this visit. There is no height or weight on file to calculate BMI.

## 2021-08-18 ENCOUNTER — Encounter: Payer: Self-pay | Admitting: Internal Medicine

## 2021-08-18 ENCOUNTER — Other Ambulatory Visit: Payer: Self-pay

## 2021-08-18 ENCOUNTER — Ambulatory Visit (INDEPENDENT_AMBULATORY_CARE_PROVIDER_SITE_OTHER): Payer: Medicare Other | Admitting: Internal Medicine

## 2021-08-18 VITALS — BP 118/66 | HR 63 | Ht 70.0 in | Wt 187.0 lb

## 2021-08-18 DIAGNOSIS — N320 Bladder-neck obstruction: Secondary | ICD-10-CM

## 2021-08-18 DIAGNOSIS — H6121 Impacted cerumen, right ear: Secondary | ICD-10-CM | POA: Diagnosis not present

## 2021-08-18 DIAGNOSIS — R739 Hyperglycemia, unspecified: Secondary | ICD-10-CM | POA: Diagnosis not present

## 2021-08-18 DIAGNOSIS — I10 Essential (primary) hypertension: Secondary | ICD-10-CM

## 2021-08-18 DIAGNOSIS — M5441 Lumbago with sciatica, right side: Secondary | ICD-10-CM

## 2021-08-18 DIAGNOSIS — Z1159 Encounter for screening for other viral diseases: Secondary | ICD-10-CM

## 2021-08-18 DIAGNOSIS — E538 Deficiency of other specified B group vitamins: Secondary | ICD-10-CM | POA: Diagnosis not present

## 2021-08-18 DIAGNOSIS — M5442 Lumbago with sciatica, left side: Secondary | ICD-10-CM

## 2021-08-18 DIAGNOSIS — G8929 Other chronic pain: Secondary | ICD-10-CM

## 2021-08-18 DIAGNOSIS — E559 Vitamin D deficiency, unspecified: Secondary | ICD-10-CM

## 2021-08-18 NOTE — Assessment & Plan Note (Signed)
Improved with irrigation, hearing improved  Ceruminosis is noted.  Wax is removed by syringing and manual debridement. Instructions for home care to prevent wax buildup are given.

## 2021-08-18 NOTE — Assessment & Plan Note (Signed)
BP Readings from Last 3 Encounters:  08/18/21 118/66  07/17/21 130/76  06/23/21 126/74   Stable, pt to continue medical treatment toprol, micardis hct

## 2021-08-18 NOTE — Progress Notes (Signed)
Patient ID: Dallon Dacosta, male   DOB: 11/19/1955, 66 y.o.   MRN: [id number]        Chief Complaint: follow up back pain, htn, hyperglycemia, vit d deficiency       HPI:  Avelino Herren is a 66 y.o. male here overall doing ok, but Pt c/o 6 wks worsening persistent LBP without bowel or bladder change, fever, wt loss,  worsening LE pain/numbness/weakness, gait change or falls, severe overall, worse to bend or stand up, no recent trauma.  Flexeril not helping,, asks for different muscle relaxer.  Did see chiropracter with plain film c/w marked scoliosis, djd/ ddd.  No recent MRI.  Pt denies chest pain, increased sob or doe, wheezing, orthopnea, PND, increased LE swelling, palpitations, dizziness or syncope.   Pt denies polydipsia, polyuria, or new focal neuro s/s.   Pt denies fever, wt loss, night sweats, loss of appetite, or other constitutional symptoms none;  also with c/o 1 wk right hearing loss without HA, fever, discharge, tinnitus or vertigo.         Wt Readings from Last 3 Encounters:  08/18/21 187 lb (84.8 kg)  07/17/21 175 lb 9.6 oz (79.7 kg)  06/23/21 180 lb 12.8 oz (82 kg)   BP Readings from Last 3 Encounters:  08/18/21 118/66  07/17/21 130/76  06/23/21 126/74         Past Medical History:  Diagnosis Date   Acetabular labrum tear 10/10/2019   Acute sinusitis 02/22/2020   Allergic rhinitis 12/18/2019   Anemia 07/03/2020   Aneurysm, ascending aorta    followed br Dr Hortencia Pilar   Ascending aortic aneurysm 06/11/2015   Body mass index (BMI) 27.0-27.9, adult 03/11/2020   Chest pain of uncertain etiology 0/17/7939   Chronic low back pain 02/22/2020   Colonic mass 10/26/2019   DOE (dyspnea on exertion) 03/16/2019   Onset early June 2020 with assoc chest tightness  - CTa 02/23/19 several segmental and subsegmental PE with micronodular lung dz ? Etiology with no venous dopplers  - 03/16/2019   Walked RA  2 laps @  approx 227f each @ nl pace  stopped due to  End of study with some sob and chest  tightness with sats 98%   - trial off acei and max rx for gerd 03/16/2019  - Echo 03/21/2019   No PH  - 03/30/2019   Walked    Droopy eyelid, left 12/18/2019   Dysphagia 01/02/2021   Eosinophilia 03/31/2019   Onset of asthma as child / [facility] eval around 2015    Essential (primary) hypertension 06/11/2015   Change from ACE inhibitor to ARB 03/16/2019 due to unexplained dry cough and chest tightness > completely resolved as of 05/23/2019    Ground glass opacity present on imaging of lung 01/02/2021   History of kidney stones    History of partial colectomy 12/18/2019   History of prosthetic unicompartmental arthroplasty of right knee 03/02/2016   Hyperglycemia 07/04/2020   Hypertension    Insomnia 12/18/2019   Localized, primary osteoarthritis of hand 02/03/2021   Mass 05/04/2017   Nasal sore 12/18/2019   Nuclear sclerotic cataract of left eye 04/21/2016   Other chronic pain 03/12/2020   Other paralytic strabismus, 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   Overweight 02/03/2021   Pain in joint of right hip 09/22/2018   Pain in limb 02/03/2021   Pain in right knee 02/03/2021   Palpitations 06/11/2015   Presence of right artificial knee joint 03/02/2016   Primary osteoarthritis 02/03/2021   Pulmonary  emboli ([place]) 05/24/2019     CTa 02/23/19 pos for PE 02/23/19  Venous dopplers not done, echo ok    RA (rheumatoid arthritis) ([place])    Rheumatoid arthritis ([place]) 02/03/2021   S/P right unicompartmental knee replacement    06-20-2015  post dislocating plastic    Seronegative rheumatoid arthritis ([place]) 12/18/2019   Status post right unicompartmental knee replacement 03/03/2016   Transient neurological symptoms 03/12/2020   Urticaria 02/03/2021   Vitamin D deficiency 02/03/2021   Past Surgical History:  Procedure Laterality Date   CARDIAC CATHETERIZATION  Feb 2015    [place]   per pt normal coronary arteries   CATARACT EXTRACTION W/ INTRAOCULAR LENS IMPLANT Right Mar 2014   CYSTOSCOPY W/ URETEROSCOPY W/ LITHOTRIPSY     EXCISION  SUBDERMAL NECK TUMOR  2010   benign   KNEE ARTHROSCOPY W/ MENISCECTOMY Right 01-09-2015   LEFT ELBOW RECONSTRUCTION  2010   PARTIAL KNEE ARTHROPLASTY Right 08/08/2015   Procedure: RIGHT UNICOMPARTMENTAL KNEE REVISION PLASTIC;  Surgeon: Paralee Cancel, MD;  Location: [facility];  Service: Orthopedics;  Laterality: Right;   PARTIAL KNEE ARTHROPLASTY Right 03/02/2016   Procedure: UNICOMPARTMENTAL RIGHT KNEE REVISION;  Surgeon: Paralee Cancel, MD;  Location: [facility];  Service: Orthopedics;  Laterality: Right;   REPLACEMENT UNICONDYLAR JOINT KNEE Right 06-20-2015   RIGHT/LEFT HEART CATH AND CORONARY ANGIOGRAPHY N/A 01/30/2021   Procedure: RIGHT/LEFT HEART CATH AND CORONARY ANGIOGRAPHY;  Surgeon: Belva Crome, MD;  Location: [facility];  Service: Cardiovascular;  Laterality: N/A;   STRABISMUS SURGERY Left x6   last one --Age 35    reports that he quit smoking about 27 years ago. His smoking use included cigarettes. He started smoking about 51 years ago. He has a 5.00 pack-year smoking history. He has never used smokeless tobacco. He reports that he does not drink alcohol and does not use drugs. family history includes Congestive Heart Failure in his father and mother. Allergies  Allergen Reactions   Dilaudid [Hydromorphone] Shortness Of Breath and Other (See Comments)    Chest tight   Hydroxychloroquine Sulfate Swelling, Itching and Other (See Comments)   Morphine Shortness Of Breath    SEVERE   Hydroxychloroquine Rash   Infliximab Rash and Other (See Comments)    (REMICADE)SEVERE   Current Outpatient Medications on File Prior to Visit  Medication Sig Dispense Refill   albuterol (VENTOLIN HFA) 108 (90 Base) MCG/ACT inhaler Inhale 2 puffs into the lungs every 6 (six) hours as needed for wheezing or shortness of breath. 8 g 0   aspirin EC 81 MG tablet Take 1 tablet (81 mg total) by mouth daily. Swallow whole. 90 tablet 3   atorvastatin (LIPITOR) 10 MG tablet Take 10 mg by  mouth daily.     Certolizumab Pegol (CIMZIA STARTER KIT [place]) Inject 2 Syringes into the skin every 28 (twenty-eight) days.     DULoxetine (CYMBALTA) 60 MG capsule TAKE 1 CAPSULE DAILY 90 capsule 2   ergocalciferol (VITAMIN D2) 1.25 MG (50000 UT) capsule Take 50,000 Units by mouth every Friday.     famotidine (PEPCID) 20 MG tablet Take 20 mg by mouth daily. After supper     flecainide (TAMBOCOR) 50 MG tablet Take 1 tablet (50 mg total) by mouth 2 (two) times daily. 180 tablet 3   Glucosamine-Chondroit-Vit C-Mn (GLUCOSAMINE 1500 COMPLEX) CAPS Take 1 capsule by mouth daily.     ibuprofen (ADVIL) 200 MG tablet Take 200 mg by mouth as needed for pain.  leflunomide (ARAVA) 20 MG tablet Take 20 mg by mouth daily.     loratadine-pseudoephedrine (CLARITIN-D 24-HOUR) 10-240 MG 24 hr tablet Take 1 tablet by mouth daily as needed for allergies.     metoprolol succinate (TOPROL-XL) 100 MG 24 hr tablet Take 1 tablet (100 mg total) by mouth daily. Take with or immediately following a meal. 90 tablet 2   Multiple Vitamin (MULTIVITAMIN WITH MINERALS) TABS tablet Take 1 tablet by mouth in the morning.     pantoprazole (PROTONIX) 40 MG tablet TAKE 30-60 MINUTES BEFORE  YOUR FIRST AND LAST MEALS  OF THE DAY 180 tablet 3   predniSONE (DELTASONE) 10 MG tablet 3 tabs by mouth per day for 3 days,2tabs per day for 3 days,1tab per day for 3 days 18 tablet 0   predniSONE (DELTASONE) 5 MG tablet Take 5 mg by mouth in the morning.     RESTASIS 0.05 % ophthalmic emulsion Place 1 drop into both eyes 2 (two) times daily.     tadalafil (CIALIS) 20 MG tablet Take 0.5-1 tablets (10-20 mg total) by mouth every other day as needed for erectile dysfunction. 10 tablet 11   telmisartan-hydrochlorothiazide (MICARDIS HCT) 80-25 MG tablet TAKE 1 TABLET DAILY 90 tablet 0   temazepam (RESTORIL) 15 MG capsule TAKE 1 CAPSULE AT BEDTIME  AS NEEDED FOR SLEEP 90 capsule 1   No current facility-administered medications on file prior to visit.         ROS:  All others reviewed and negative.  Objective        PE:  BP 118/66 (BP Location: Right Arm, Patient Position: Sitting, Cuff Size: Large)    Pulse 63    Ht 5' 10"  (1.778 m)    Wt 187 lb (84.8 kg)    SpO2 96%    BMI 26.83 kg/m                 Constitutional: Pt appears in NAD               HENT: Head: NCAT.                Right Ear: External ear normal.                 Left Ear: External ear normal.                Eyes: . Pupils are equal, round, and reactive to light. Conjunctivae and EOM are normal               Nose: without d/c or deformity               Neck: Neck supple. Gross normal ROM               Cardiovascular: Normal rate and regular rhythm.                 Pulmonary/Chest: Effort normal and breath sounds without rales or wheezing.                      LS spine diffuse midline tender without rash or swelling               Abd:  Soft, NT, ND, + BS, no organomegaly               Neurological: Pt is alert. At baseline orientation, motor grossly intact               Skin:  Skin is warm. No rashes, no other new lesions, LE edema - none               Psychiatric: Pt behavior is normal without agitation   Micro: none  Cardiac tracings I have personally interpreted today:  none  Pertinent Radiological findings (summarize): none   Lab Results  Component Value Date   WBC 5.8 03/14/2021   HGB 13.5 03/14/2021   HCT 39.6 03/14/2021   PLT 257.0 03/14/2021   GLUCOSE 101 (H) 03/18/2021   CHOL 170 01/02/2021   TRIG 170.0 (H) 01/02/2021   HDL 52.20 01/02/2021   LDLDIRECT 71.0 12/18/2019   LDLCALC 83 01/02/2021   ALT 14 01/02/2021   AST 21 01/02/2021   NA 138 03/18/2021   K 4.3 03/18/2021   CL 99 03/18/2021   CREATININE 1.06 03/18/2021   BUN 19 03/18/2021   CO2 24 03/18/2021   TSH 1.05 03/14/2021   PSA 0.55 01/02/2021   HGBA1C 5.9 01/02/2021   Assessment/Plan:  Luvern Mcisaac is a 66 y.o. White or Caucasian [1] male with  has a past medical history of  Acetabular labrum tear (10/10/2019), Acute sinusitis (02/22/2020), Allergic rhinitis (12/18/2019), Anemia (07/03/2020), Aneurysm, ascending aorta, Ascending aortic aneurysm (06/11/2015), Body mass index (BMI) 27.0-27.9, adult (03/11/2020), Chest pain of uncertain etiology (2/49/3241), Chronic low back pain (02/22/2020), Colonic mass (10/26/2019), DOE (dyspnea on exertion) (03/16/2019), Droopy eyelid, left (12/18/2019), Dysphagia (01/02/2021), Eosinophilia (03/31/2019), Essential (primary) hypertension (06/11/2015), Ground glass opacity present on imaging of lung (01/02/2021), History of kidney stones, History of partial colectomy (12/18/2019), History of prosthetic unicompartmental arthroplasty of right knee (03/02/2016), Hyperglycemia (07/04/2020), Hypertension, Insomnia (12/18/2019), Localized, primary osteoarthritis of hand (02/03/2021), Mass (05/04/2017), Nasal sore (12/18/2019), Nuclear sclerotic cataract of left eye (04/21/2016), Other chronic pain (03/12/2020), Other paralytic strabismus, left eye (12/18/2019), Overweight (02/03/2021), Pain in joint of right hip (09/22/2018), Pain in limb (02/03/2021), Pain in right knee (02/03/2021), Palpitations (06/11/2015), Presence of right artificial knee joint (03/02/2016), Primary osteoarthritis (02/03/2021), Pulmonary emboli ([place]) (05/24/2019), RA (rheumatoid arthritis) ([place]), Rheumatoid arthritis ([place]) (02/03/2021), S/P right unicompartmental knee replacement, Seronegative rheumatoid arthritis ([place]) (12/18/2019), Status post right unicompartmental knee replacement (03/03/2016), Transient neurological symptoms (03/12/2020), Urticaria (02/03/2021), and Vitamin D deficiency (02/03/2021).  Essential (primary) hypertension BP Readings from Last 3 Encounters:  08/18/21 118/66  07/17/21 130/76  06/23/21 126/74   Stable, pt to continue medical treatment toprol, micardis hct   Hyperglycemia Lab Results  Component Value Date   HGBA1C 5.9 01/02/2021   Stable, pt to continue current medical treatment   - diet   Vitamin D deficiency Last vitamin D Lab Results  Component Value Date   VD25OH 43.38 01/02/2021   Stable, cont oral replacement   Right ear impacted cerumen Improved with irrigation, hearing improved  Ceruminosis is noted.  Wax is removed by syringing and manual debridement. Instructions for home care to prevent wax buildup are given.   Chronic low back pain With marked worsening recent, consdier MRI, robaxin prn,  to f/u any worsening symptoms or concerns  Followup: Return in about 6 months (around 02/15/2022).  Cathlean Cower, MD 08/18/2021 8:35 PM [facility]

## 2021-08-18 NOTE — Assessment & Plan Note (Signed)
Lab Results  Component Value Date   HGBA1C 5.9 01/02/2021   Stable, pt to continue current medical treatment  - diet

## 2021-08-18 NOTE — Assessment & Plan Note (Addendum)
With marked worsening recent, consdier MRI, robaxin prn,  to f/u any worsening symptoms or concerns

## 2021-08-18 NOTE — Patient Instructions (Signed)
Your Right ear was cleared of wax today  Please continue all other medications as before, and refills have been done if requested.  Please have the pharmacy call with any other refills you may need.  Please continue your efforts at being more active, low cholesterol diet, and weight control.  You are otherwise up to date with prevention measures today.  Please keep your appointments with your specialists as you may have planned  Please go to the LAB at the blood drawing area for the tests to be done  You will be contacted by phone if any changes need to be made immediately.  Otherwise, you will receive a letter about your results with an explanation, but please check with MyChart first.  Please remember to sign up for MyChart if you have not done so, as this will be important to you in the future with finding out test results, communicating by private email, and scheduling acute appointments online when needed.  Please make an Appointment to return in 6 months, or sooner if needed

## 2021-08-18 NOTE — Progress Notes (Signed)
Patient consent obtained. Irrigation with water and peroxide performed on right ear. Full view of tympanic membranes after procedure.  Patient tolerated procedure well.

## 2021-08-18 NOTE — Assessment & Plan Note (Signed)
Last vitamin D Lab Results  Component Value Date   VD25OH 43.38 01/02/2021   Stable, cont oral replacement

## 2021-08-19 ENCOUNTER — Encounter: Payer: Self-pay | Admitting: Internal Medicine

## 2021-08-19 ENCOUNTER — Other Ambulatory Visit: Payer: Self-pay | Admitting: Internal Medicine

## 2021-08-19 DIAGNOSIS — I10 Essential (primary) hypertension: Secondary | ICD-10-CM

## 2021-08-19 LAB — LIPID PANEL
Cholesterol: 272 mg/dL — ABNORMAL HIGH (ref 0–200)
HDL: 47.3 mg/dL (ref >39.00)
Total CHOL/HDL Ratio: 6
Triglycerides: 723 mg/dL — ABNORMAL HIGH (ref 0.0–149.0)

## 2021-08-19 LAB — BASIC METABOLIC PANEL
BUN: 23 mg/dL (ref 6–23)
CO2: 30 mEq/L (ref 19–32)
Calcium: 9.2 mg/dL (ref 8.4–10.5)
Chloride: 94 mEq/L — ABNORMAL LOW (ref 96–112)
Creatinine, Ser: 1.64 mg/dL — ABNORMAL HIGH (ref 0.40–1.50)
GFR: 43.83 mL/min — ABNORMAL LOW (ref >60.00)
Glucose, Bld: 85 mg/dL (ref 70–99)
Potassium: 3.6 mEq/L (ref 3.5–5.1)
Sodium: 133 mEq/L — ABNORMAL LOW (ref 135–145)

## 2021-08-19 LAB — HEPATIC FUNCTION PANEL
ALT: 12 U/L (ref 0–53)
AST: 18 U/L (ref 0–37)
Albumin: 4.2 g/dL (ref 3.5–5.2)
Alkaline Phosphatase: 59 U/L (ref 39–117)
Bilirubin, Direct: 0.1 mg/dL (ref 0.0–0.3)
Total Bilirubin: 0.6 mg/dL (ref 0.2–1.2)
Total Protein: 6.2 g/dL (ref 6.0–8.3)

## 2021-08-19 LAB — CBC WITH DIFFERENTIAL/PLATELET
Basophils Absolute: 0.1 10*3/uL (ref 0.0–0.1)
Basophils Relative: 1.1 % (ref 0.0–3.0)
Eosinophils Absolute: 0.3 10*3/uL (ref 0.0–0.7)
Eosinophils Relative: 3.5 % (ref 0.0–5.0)
HCT: 37.7 % — ABNORMAL LOW (ref 39.0–52.0)
Hemoglobin: 13 g/dL (ref 13.0–17.0)
Lymphocytes Relative: 24.1 % (ref 12.0–46.0)
Lymphs Abs: 2.1 10*3/uL (ref 0.7–4.0)
MCHC: 34.6 g/dL (ref 30.0–36.0)
MCV: 97.3 fl (ref 78.0–100.0)
Monocytes Absolute: 1.4 10*3/uL — ABNORMAL HIGH (ref 0.1–1.0)
Monocytes Relative: 15.9 % — ABNORMAL HIGH (ref 3.0–12.0)
Neutro Abs: 4.8 10*3/uL (ref 1.4–7.7)
Neutrophils Relative %: 55.4 % (ref 43.0–77.0)
Platelets: 312 10*3/uL (ref 150.0–400.0)
RBC: 3.87 Mil/uL — ABNORMAL LOW (ref 4.22–5.81)
RDW: 14.3 % (ref 11.5–15.5)
WBC: 8.6 10*3/uL (ref 4.0–10.5)

## 2021-08-19 LAB — VITAMIN B12: Vitamin B-12: 347 pg/mL (ref 211–911)

## 2021-08-19 LAB — HEPATITIS C ANTIBODY
Hepatitis C Ab: NONREACTIVE
SIGNAL TO CUT-OFF: 0.07 (ref <1.00)

## 2021-08-19 LAB — TSH: TSH: 1.82 u[IU]/mL (ref 0.35–5.50)

## 2021-08-19 LAB — LDL CHOLESTEROL, DIRECT: Direct LDL: 160 mg/dL

## 2021-08-19 LAB — VITAMIN D 25 HYDROXY (VIT D DEFICIENCY, FRACTURES): VITD: 30.64 ng/mL (ref 30.00–100.00)

## 2021-08-19 LAB — HEMOGLOBIN A1C: Hgb A1c MFr Bld: 5.9 % (ref 4.6–6.5)

## 2021-08-19 LAB — PSA: PSA: 0.47 ng/mL (ref 0.10–4.00)

## 2021-08-19 MED ORDER — TELMISARTAN 80 MG PO TABS: 80.0000 mg | ORAL_TABLET | Freq: Every day | ORAL | 3 refills | Status: DC

## 2021-08-20 NOTE — Telephone Encounter (Signed)
Pt called back in for his results. I advised the pt of his recommendations and scheduled a lab visit for Next week 08/27/21 for lab redraw.

## 2021-08-27 ENCOUNTER — Other Ambulatory Visit: Payer: Self-pay

## 2021-08-27 ENCOUNTER — Other Ambulatory Visit (INDEPENDENT_AMBULATORY_CARE_PROVIDER_SITE_OTHER): Payer: Medicare Other

## 2021-08-27 DIAGNOSIS — I10 Essential (primary) hypertension: Secondary | ICD-10-CM | POA: Diagnosis not present

## 2021-08-27 LAB — BASIC METABOLIC PANEL
BUN: 13 mg/dL (ref 6–23)
CO2: 28 mEq/L (ref 19–32)
Calcium: 8.5 mg/dL (ref 8.4–10.5)
Chloride: 101 mEq/L (ref 96–112)
Creatinine, Ser: 1.14 mg/dL (ref 0.40–1.50)
GFR: 67.81 mL/min (ref >60.00)
Glucose, Bld: 85 mg/dL (ref 70–99)
Potassium: 3.9 mEq/L (ref 3.5–5.1)
Sodium: 136 mEq/L (ref 135–145)

## 2021-08-28 ENCOUNTER — Other Ambulatory Visit: Payer: Self-pay | Admitting: Internal Medicine

## 2021-08-29 NOTE — Telephone Encounter (Signed)
Please refill as per office routine med refill policy (all routine meds to be refilled for 3 mo or monthly (per pt preference) up to one year from last visit, then month to month grace period for 3 mo, then further med refills will have to be denied) ? ?

## 2021-09-03 ENCOUNTER — Encounter: Payer: Self-pay | Admitting: Internal Medicine

## 2021-09-03 DIAGNOSIS — G8929 Other chronic pain: Secondary | ICD-10-CM

## 2021-09-04 ENCOUNTER — Other Ambulatory Visit: Payer: Self-pay

## 2021-09-04 ENCOUNTER — Other Ambulatory Visit: Payer: Self-pay | Admitting: Internal Medicine

## 2021-09-04 MED ORDER — FAMOTIDINE 20 MG PO TABS: 20.0000 mg | ORAL_TABLET | Freq: Every day | ORAL | 2 refills | Status: DC

## 2021-09-04 MED ORDER — FLECAINIDE ACETATE 50 MG PO TABS: 50.0000 mg | ORAL_TABLET | Freq: Two times a day (BID) | ORAL | 1 refills | Status: DC

## 2021-09-09 ENCOUNTER — Encounter: Payer: Self-pay | Admitting: Internal Medicine

## 2021-09-09 DIAGNOSIS — G8929 Other chronic pain: Secondary | ICD-10-CM

## 2021-09-10 NOTE — Telephone Encounter (Signed)
Pt checking status of referral request, advised pt ortho referral was made on 09-09-2021  Pt has additional questions and requesting a c/b  Phone [phone]

## 2021-09-10 NOTE — Telephone Encounter (Signed)
Spoke with patient regarding referral.

## 2021-09-15 DIAGNOSIS — M4150 Other secondary scoliosis, site unspecified: Secondary | ICD-10-CM | POA: Insufficient documentation

## 2021-09-18 ENCOUNTER — Other Ambulatory Visit: Payer: Self-pay

## 2021-09-18 ENCOUNTER — Ambulatory Visit (INDEPENDENT_AMBULATORY_CARE_PROVIDER_SITE_OTHER): Payer: Medicare Other | Admitting: Cardiology

## 2021-09-18 ENCOUNTER — Encounter: Payer: Self-pay | Admitting: Cardiology

## 2021-09-18 VITALS — BP 138/94 | HR 87 | Ht 70.0 in | Wt 185.6 lb

## 2021-09-18 DIAGNOSIS — Z79899 Other long term (current) drug therapy: Secondary | ICD-10-CM | POA: Diagnosis not present

## 2021-09-18 DIAGNOSIS — I471 Supraventricular tachycardia: Secondary | ICD-10-CM

## 2021-09-18 DIAGNOSIS — Z6827 Body mass index (BMI) 27.0-27.9, adult: Secondary | ICD-10-CM

## 2021-09-18 DIAGNOSIS — I7121 Aneurysm of the ascending aorta, without rupture: Secondary | ICD-10-CM

## 2021-09-18 DIAGNOSIS — I10 Essential (primary) hypertension: Secondary | ICD-10-CM | POA: Diagnosis not present

## 2021-09-18 MED ORDER — HYDROCHLOROTHIAZIDE 12.5 MG PO CAPS: 12.5000 mg | ORAL_CAPSULE | Freq: Every day | ORAL | 3 refills | Status: DC

## 2021-09-18 NOTE — Patient Instructions (Addendum)
Medication Instructions:  Your physician has recommended you make the following change in your medication:  START: Hydrochlorothiazide 12.5 mg once daily *If you need a refill on your cardiac medications before your next appointment, please call your pharmacy*   Lab Work: Your physician recommends that you return for lab work in:  In 12 weeks: BMET, Engineer, materials (Can do at [employer] office)  If you have labs (blood work) drawn today and your tests are completely normal, you will receive your results only by: [place] (if you have [place]) OR A paper copy in the mail If you have any lab test that is abnormal or we need to change your treatment, we will call you to review the results.   Testing/Procedures: None   Follow-Up: At [facility], you and your health needs are our priority.  As part of our continuing mission to provide you with exceptional heart care, we have created designated Provider Care Teams.  These Care Teams include your primary Cardiologist (physician) and Advanced Practice Providers (APPs -  Physician Assistants and Nurse Practitioners) who all work together to provide you with the care you need, when you need it.  We recommend signing up for the patient portal called "MyChart".  Sign up information is provided on this After Visit Summary.  MyChart is used to connect with patients for Virtual Visits (Telemedicine).  Patients are able to view lab/test results, encounter notes, upcoming appointments, etc.  Non-urgent messages can be sent to your provider as well.   To learn more about what you can do with MyChart, go to [email or web address].    Your next appointment:   6 month(s)  The format for your next appointment:   In Person  Provider:   Berniece Salines, DO     Other Instructions  Please take your blood pressure daily, record and send a MyChart message in 2 weeks. Please include heart rate.

## 2021-09-18 NOTE — Progress Notes (Signed)
Cardiology Office Note:    Date:  09/18/2021   ID:  Luis Wilkinson, DOB [birth year], MRN [id number]  PCP:  Biagio Borg, MD  Cardiologist:  Berniece Salines, DO  Electrophysiologist:  None   Referring MD: Biagio Borg, MD   " I am having elevated blood pressure home"  History of Present Illness:    Luis Wilkinson is a 66 y.o. male with a hx of right and left heart catheterization) with no evidence of coronary artery disease and normal right-sided pressures, ascending aortic aneurysm 43 mm on imaging done on January 16, 2021 reported to be unchanged from his previous study which was done in December 2021     I did see the patient back in December for a operative clearance for hand surgery.  We will repeat an echocardiogram and a CTA given his history of ascending thoracic aneurysm.  I also referred the patient to vascular surgery.  He was intermittently cleared for his hand surgery.  He was able to get his hand surgery.  He also has seen vascular surgeon.   I saw the patient in March 2022 at that time he appeared to be doing well from a cardiovascular standpoint no changes were made to his medication regimen.  He did have some shortness of breath for recommended patient get a PFT to make sure secondary lung pathology was not playing a role as his echocardiogram was normal.   He was seen on January 30, 2021 giving the worsening of symptoms are very out of proportion with his shortness of breath I recommended patient undergo left and right heart catheterization.  At that time I also place a ZIO monitor on the patient.   At his last visit which was on March 18, 2021 his flecainide had been increased prior to that visit by EP.  He was doing well.  Since I saw the patient he tells me that he recently had a colonoscopy at that time he had significantly elevated blood pressure.  He notes at home his blood pressure is also elevated.   Past Medical History:  Diagnosis Date   Acetabular labrum tear 10/10/2019    Acute sinusitis 02/22/2020   Allergic rhinitis 12/18/2019   Anemia 07/03/2020   Aneurysm, ascending aorta    followed br Dr Hortencia Pilar   Ascending aortic aneurysm 06/11/2015   Body mass index (BMI) 27.0-27.9, adult 03/11/2020   Chest pain of uncertain etiology 04/03/538   Chronic low back pain 02/22/2020   Colonic mass 10/26/2019   DOE (dyspnea on exertion) 03/16/2019   Onset early June 2020 with assoc chest tightness  - CTa 02/23/19 several segmental and subsegmental PE with micronodular lung dz ? Etiology with no venous dopplers  - 03/16/2019   Walked RA  2 laps @  approx 224ft each @ nl pace  stopped due to  End of study with some sob and chest tightness with sats 98%   - trial off acei and max rx for gerd 03/16/2019  - Echo 03/21/2019   No PH  - 03/30/2019   Walked    Droopy eyelid, left 12/18/2019   Dysphagia 01/02/2021   Eosinophilia 03/31/2019   Onset of asthma as child / [facility] eval around 2015    Essential (primary) hypertension 06/11/2015   Change from ACE inhibitor to ARB 03/16/2019 due to unexplained dry cough and chest tightness > completely resolved as of 05/23/2019    Ground glass opacity present on imaging of lung 01/02/2021   History of kidney  stones    History of partial colectomy 12/18/2019   History of prosthetic unicompartmental arthroplasty of right knee 03/02/2016   Hyperglycemia 07/04/2020   Hypertension    Insomnia 12/18/2019   Localized, primary osteoarthritis of hand 02/03/2021   Mass 05/04/2017   Nasal sore 12/18/2019   Nuclear sclerotic cataract of left eye 04/21/2016   Other chronic pain 03/12/2020   Other paralytic strabismus, 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)    Rheumatoid arthritis ([place]) 02/03/2021   S/P right unicompartmental knee replacement    06-20-2015  post dislocating plastic    Seronegative rheumatoid arthritis ([place]) 12/18/2019   Status post right unicompartmental knee replacement 03/03/2016   Transient neurological symptoms 03/12/2020   Urticaria 02/03/2021   Vitamin D deficiency 02/03/2021    Past Surgical History:  Procedure Laterality Date   CARDIAC CATHETERIZATION  Feb 2015    [place]   per pt normal coronary arteries   CATARACT EXTRACTION W/ INTRAOCULAR LENS IMPLANT Right Mar 2014   CYSTOSCOPY W/ URETEROSCOPY W/ LITHOTRIPSY     EXCISION SUBDERMAL NECK TUMOR  2010   benign   KNEE ARTHROSCOPY W/ MENISCECTOMY Right 01-09-2015   LEFT ELBOW RECONSTRUCTION  2010   PARTIAL KNEE ARTHROPLASTY Right 08/08/2015   Procedure: RIGHT UNICOMPARTMENTAL KNEE REVISION PLASTIC;  Surgeon: Paralee Cancel, MD;  Location: [facility];  Service: Orthopedics;  Laterality: Right;   PARTIAL KNEE ARTHROPLASTY Right 03/02/2016   Procedure: UNICOMPARTMENTAL RIGHT KNEE REVISION;  Surgeon: Paralee Cancel, MD;  Location: [facility];  Service: Orthopedics;  Laterality: Right;   REPLACEMENT UNICONDYLAR JOINT KNEE Right 06-20-2015   RIGHT/LEFT HEART CATH AND CORONARY ANGIOGRAPHY N/A 01/30/2021   Procedure: RIGHT/LEFT HEART CATH AND CORONARY ANGIOGRAPHY;  Surgeon: Belva Crome, MD;  Location: [facility];  Service: Cardiovascular;  Laterality: N/A;   STRABISMUS SURGERY Left x6   last one --Age 46    Current Medications: Current Meds  Medication Sig   albuterol (VENTOLIN HFA) 108 (90 Base) MCG/ACT inhaler Inhale 2 puffs into the lungs every 6 (six) hours as needed for wheezing or shortness of breath.   aspirin EC 81 MG tablet Take 1 tablet (81 mg total) by mouth daily. Swallow whole.   atorvastatin (LIPITOR) 10 MG tablet TAKE 1 TABLET DAILY   Certolizumab Pegol (CIMZIA STARTER KIT [place]) Inject 2 Syringes into the skin  every 28 (twenty-eight) days.   DULoxetine (CYMBALTA) 60 MG capsule TAKE 1 CAPSULE DAILY   ergocalciferol (VITAMIN D2) 1.25 MG (50000 UT) capsule Take 50,000 Units by mouth every Friday.   famotidine (PEPCID) 20 MG tablet Take 1 tablet (20 mg total) by mouth daily. After supper   flecainide (TAMBOCOR) 50 MG tablet Take 1 tablet (50 mg total) by mouth 2 (two) times daily.   Glucosamine-Chondroit-Vit C-Mn (GLUCOSAMINE 1500 COMPLEX) CAPS Take 1 capsule by mouth daily.   hydrochlorothiazide (MICROZIDE) 12.5 MG capsule Take 1 capsule (12.5 mg total) by mouth daily.   ibuprofen (ADVIL) 200 MG tablet Take 200 mg by mouth as needed for pain.   leflunomide (ARAVA) 20 MG tablet Take  20 mg by mouth daily.   loratadine-pseudoephedrine (CLARITIN-D 24-HOUR) 10-240 MG 24 hr tablet Take 1 tablet by mouth daily as needed for allergies.   metoprolol succinate (TOPROL-XL) 100 MG 24 hr tablet Take 1 tablet (100 mg total) by mouth daily. Take with or immediately following a meal.   Multiple Vitamin (MULTIVITAMIN WITH MINERALS) TABS tablet Take 1 tablet by mouth in the morning.   pantoprazole (PROTONIX) 40 MG tablet TAKE 30-60 MINUTES BEFORE  YOUR FIRST AND LAST MEALS  OF THE DAY   predniSONE (DELTASONE) 5 MG tablet Take 5 mg by mouth daily with breakfast.   RESTASIS 0.05 % ophthalmic emulsion Place 1 drop into both eyes 2 (two) times daily.   telmisartan (MICARDIS) 80 MG tablet Take 1 tablet (80 mg total) by mouth daily.   temazepam (RESTORIL) 15 MG capsule TAKE 1 CAPSULE AT BEDTIME  AS NEEDED FOR SLEEP     Allergies:   Dilaudid [hydromorphone], Hydroxychloroquine sulfate, Morphine, Hydroxychloroquine, and Infliximab   Social History   Socioeconomic History   Marital status: Married    Spouse name: Not on file   Number of children: Not on file   Years of education: Not on file   Highest education level: Not on file  Occupational History   Not on file  Tobacco Use   Smoking status: Former    Packs/day:  0.50    Years: 10.00    Pack years: 5.00    Types: Cigarettes    Start date: 40    Quit date: 07/19/1994    Years since quitting: 27.1   Smokeless tobacco: Never  Substance and Sexual Activity   Alcohol use: No   Drug use: No   Sexual activity: Not on file  Other Topics Concern   Not on file  Social History Narrative   Not on file   Social Determinants of Health   Financial Resource Strain: Low Risk    Difficulty of Paying Living Expenses: Not hard at all  Food Insecurity: No Food Insecurity   Worried About Charity fundraiser in the Last Year: Never true   Ran Out of Food in the Last Year: Never true  Transportation Needs: No Transportation Needs   Lack of Transportation (Medical): No   Lack of Transportation (Non-Medical): No  Physical Activity: Inactive   Days of Exercise per Week: 0 days   Minutes of Exercise per Session: 0 min  Stress: No Stress Concern Present   Feeling of Stress : Not at all  Social Connections: Socially Integrated   Frequency of Communication with Friends and Family: More than three times a week   Frequency of Social Gatherings with Friends and Family: More than three times a week   Attends Religious Services: More than 4 times per year   Active Member of [employer] or Organizations: Yes   Attends Music therapist: More than 4 times per year   Marital Status: Married     Family History: The patient's family history includes Congestive Heart Failure in his father and mother.  ROS:   Review of Systems  Constitution: Negative for decreased appetite, fever and weight gain.  HENT: Negative for congestion, ear discharge, hoarse voice and sore throat.   Eyes: Negative for discharge, redness, vision loss in right eye and visual halos.  Cardiovascular: Negative for chest pain, dyspnea on exertion, leg swelling, orthopnea and palpitations.  Respiratory: Negative for cough, hemoptysis, shortness of breath and snoring.   Endocrine: Negative  for heat intolerance  and polyphagia.  Hematologic/Lymphatic: Negative for bleeding problem. Does not bruise/bleed easily.  Skin: Negative for flushing, nail changes, rash and suspicious lesions.  Musculoskeletal: Negative for arthritis, joint pain, muscle cramps, myalgias, neck pain and stiffness.  Gastrointestinal: Negative for abdominal pain, bowel incontinence, diarrhea and excessive appetite.  Genitourinary: Negative for decreased libido, genital sores and incomplete emptying.  Neurological: Negative for brief paralysis, focal weakness, headaches and loss of balance.  Psychiatric/Behavioral: Negative for altered mental status, depression and suicidal ideas.  Allergic/Immunologic: Negative for HIV exposure and persistent infections.    EKGs/Labs/Other Studies Reviewed:    The following studies were reviewed today:   EKG:  None today  [facility] 01/30/2021 Right dominant normal coronary arteries. Normal left ventricular systolic function with EF greater than 55%.  LVEDP 14 mmHg. Normal right heart pressures with mean wedge pressure 11 mmHg.   RECOMMENDATIONS:   Exertional dyspnea does not appear to be related to pulmonary hypertension from prior pulmonary emboli, systolic or diastolic heart failure, and/or coronary artery disease with myocardial ischemia. Consider cardiopulmonary function testing. 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  TTE 01/22/2021 IMPRESSIONS   1. Left ventricular ejection fraction, by estimation, is 60 to 65%. The  left ventricle has normal function. The left ventricle has no regional  wall motion abnormalities. Left ventricular diastolic parameters are  indeterminate.   2. Right ventricular systolic function is normal. The right ventricular  size is normal. There is normal pulmonary artery systolic pressure.   3. The mitral valve is normal in structure. No evidence of mitral valve  regurgitation. No evidence of mitral stenosis.   4. The aortic valve is normal  in structure. Aortic valve regurgitation is  mild. No aortic stenosis is present.   5. Aneurysm of the ascending aorta, measuring 44 mm.   6. The inferior vena cava is normal in size with greater than 50%  respiratory variability, suggesting right atrial pressure of 3 mmHg.   FINDINGS   Left Ventricle: Left ventricular ejection fraction, by estimation, is 60  to 65%. The left ventricle has normal function. The left ventricle has no  regional wall motion abnormalities. The left ventricular internal cavity  size was normal in size. There is   borderline left ventricular hypertrophy. Left ventricular diastolic  parameters are indeterminate.   Right Ventricle: The right ventricular size is normal. No increase in  right ventricular wall thickness. Right ventricular systolic function is  normal. There is normal pulmonary artery systolic pressure. The tricuspid  regurgitant velocity is 2.02 m/s, and   with an assumed right atrial pressure of 3 mmHg, the estimated right  ventricular systolic pressure is 17.4 mmHg.   Left Atrium: Left atrial size was normal in size.   Right Atrium: Right atrial size was normal in size.   Pericardium: There is no evidence of pericardial effusion.   Mitral Valve: The mitral valve is normal in structure. No evidence of  mitral valve regurgitation. No evidence of mitral valve stenosis.   Tricuspid Valve: The tricuspid valve is normal in structure. Tricuspid  valve regurgitation is not demonstrated. No evidence of tricuspid  stenosis.   Aortic Valve: The aortic valve is normal in structure. Aortic valve  regurgitation is mild. No aortic stenosis is present.   Pulmonic Valve: The pulmonic valve was normal in structure. Pulmonic valve  regurgitation is not visualized. No evidence of pulmonic stenosis.   Aorta: The aortic root is normal in size and structure. There is an  aneurysm involving the ascending aorta measuring 44  mm.   Venous: The inferior vena  cava is normal in size with greater than 50%  respiratory variability, suggesting right atrial pressure of 3 mmHg.   IAS/Shunts: No atrial level shunt detected by color flow Doppler.       Recent Labs: 03/18/2021: Magnesium 1.9 08/18/2021: ALT 12; Hemoglobin 13.0; Platelets 312.0; TSH 1.82 08/27/2021: BUN 13; Creatinine, Ser 1.14; Potassium 3.9; Sodium 136  Recent Lipid Panel    Component Value Date/Time   CHOL 272 (H) 08/18/2021 1641   TRIG (H) 08/18/2021 1641    723.0 Triglyceride is over 400; calculations on Lipids are invalid.   HDL 47.30 08/18/2021 1641   CHOLHDL 6 08/18/2021 1641   VLDL 34.0 01/02/2021 1019   LDLCALC 83 01/02/2021 1019   LDLDIRECT 160.0 08/18/2021 1641    Physical Exam:    VS:  BP (!) 138/94    Pulse 87    Ht 5' 10"  (1.778 m)    Wt 185 lb 9.6 oz (84.2 kg)    SpO2 98%    BMI 26.63 kg/m     Wt Readings from Last 3 Encounters:  09/18/21 185 lb 9.6 oz (84.2 kg)  08/18/21 187 lb (84.8 kg)  07/17/21 175 lb 9.6 oz (79.7 kg)     GEN: Well nourished, well developed in no acute distress HEENT: Normal NECK: No JVD; No carotid bruits LYMPHATICS: No lymphadenopathy CARDIAC: S1S2 noted,RRR, no murmurs, rubs, gallops RESPIRATORY:  Clear to auscultation without rales, wheezing or rhonchi  ABDOMEN: Soft, non-tender, non-distended, +bowel sounds, no guarding. EXTREMITIES: No edema, No cyanosis, no clubbing MUSCULOSKELETAL:  No deformity  SKIN: Warm and dry NEUROLOGIC:  Alert and oriented x 3, non-focal PSYCHIATRIC:  Normal affect, good insight  ASSESSMENT:    1. Essential (primary) hypertension   2. Medication management   3. Aneurysm of ascending aorta without rupture   4. Body mass index (BMI) 27.0-27.9, adult   5. PSVT (paroxysmal supraventricular tachycardia) (HCC)    PLAN:    His blood pressure is elevated in the office today.  His medication was adjusted based on slightly increasing his creatinine.  I was able to review the blood work I think he can be  able to tolerate low-dose hydrochlorothiazide therefore we will restart the patient on HCTZ 12.5 mg daily.  His blood pressure target is less than 130/80 mmHg.  He was sent me updated blood pressure in the next 2 weeks.  We will follow-up blood work in 12 weeks for his kidney function.  He will need follow-up imaging for his aneurysm in August 2023.  In terms of his paroxysmal SVT he is tolerating his flecainide and metoprolol we will continue this.  The patient is in agreement with the above plan. The patient left the office in stable condition.  The patient will follow up in 6 months or sooner if needed.   Medication Adjustments/Labs and Tests Ordered: Current medicines are reviewed at length with the patient today.  Concerns regarding medicines are outlined above.  Orders Placed This Encounter  Procedures   Basic Metabolic Panel (BMET)   Magnesium   Meds ordered this encounter  Medications   hydrochlorothiazide (MICROZIDE) 12.5 MG capsule    Sig: Take 1 capsule (12.5 mg total) by mouth daily.    Dispense:  90 capsule    Refill:  3    Patient Instructions  Medication Instructions:  Your physician has recommended you make the following change in your medication:  START: Hydrochlorothiazide 12.5 mg once daily *If you  need a refill on your cardiac medications before your next appointment, please call your pharmacy*   Lab Work: Your physician recommends that you return for lab work in:  In 12 weeks: BMET, Engineer, materials (Can do at [employer] office)  If you have labs (blood work) drawn today and your tests are completely normal, you will receive your results only by: [facility] (if you have [place]) OR A paper copy in the mail If you have any lab test that is abnormal or we need to change your treatment, we will call you to review the results.   Testing/Procedures: None   Follow-Up: At [facility], you and your health needs are our priority.  As part of our continuing mission to  provide you with exceptional heart care, we have created designated Provider Care Teams.  These Care Teams include your primary Cardiologist (physician) and Advanced Practice Providers (APPs -  Physician Assistants and Nurse Practitioners) who all work together to provide you with the care you need, when you need it.  We recommend signing up for the patient portal called "MyChart".  Sign up information is provided on this After Visit Summary.  MyChart is used to connect with patients for Virtual Visits (Telemedicine).  Patients are able to view lab/test results, encounter notes, upcoming appointments, etc.  Non-urgent messages can be sent to your provider as well.   To learn more about what you can do with MyChart, go to [email or web address].    Your next appointment:   6 month(s)  The format for your next appointment:   In Person  Provider:   Berniece Salines, DO     Other Instructions  Please take your blood pressure daily, record and send a MyChart message in 2 weeks. Please include heart rate.    Adopting a Healthy Lifestyle.  Know what a healthy weight is for you (roughly BMI <25) and aim to maintain this   Aim for 7+ servings of fruits and vegetables daily   65-80+ fluid ounces of water or unsweet tea for healthy kidneys   Limit to max 1 drink of alcohol per day; avoid smoking/tobacco   Limit animal fats in diet for cholesterol and heart health - choose grass fed whenever available   Avoid highly processed foods, and foods high in saturated/trans fats   Aim for low stress - take time to unwind and care for your mental health   Aim for 150 min of moderate intensity exercise weekly for heart health, and weights twice weekly for bone health   Aim for 7-9 hours of sleep daily   When it comes to diets, agreement about the perfect plan isnt easy to find, even among the experts. Experts at the [place] developed an idea known as the Healthy Eating Plate.  Just imagine a plate divided into logical, healthy portions.   The emphasis is on diet quality:   Load up on vegetables and fruits - one-half of your plate: Aim for color and variety, and remember that potatoes dont count.   Go for whole grains - one-quarter of your plate: Whole wheat, barley, wheat berries, quinoa, oats, brown rice, and foods made with them. If you want pasta, go with whole wheat pasta.   Protein power - one-quarter of your plate: Fish, chicken, beans, and nuts are all healthy, versatile protein sources. Limit red meat.   The diet, however, does go beyond the plate, offering a few other suggestions.   Use healthy plant oils, such as  olive, canola, soy, corn, sunflower and peanut. Check the labels, and avoid partially hydrogenated oil, which have unhealthy trans fats.   If youre thirsty, drink water. Coffee and tea are good in moderation, but skip sugary drinks and limit milk and dairy products to one or two daily servings.   The type of carbohydrate in the diet is more important than the amount. Some sources of carbohydrates, such as vegetables, fruits, whole grains, and beans-are healthier than others.   Finally, stay active  Signed, Berniece Salines, DO  09/18/2021 11:11 AM    [place]

## 2021-09-20 ENCOUNTER — Other Ambulatory Visit: Payer: Self-pay | Admitting: Internal Medicine

## 2021-09-26 ENCOUNTER — Other Ambulatory Visit: Payer: Self-pay | Admitting: Internal Medicine

## 2021-09-30 ENCOUNTER — Other Ambulatory Visit: Payer: Self-pay | Admitting: Internal Medicine

## 2021-10-01 ENCOUNTER — Encounter: Payer: Self-pay | Admitting: Cardiology

## 2021-10-01 ENCOUNTER — Encounter: Payer: Self-pay | Admitting: Internal Medicine

## 2021-10-01 DIAGNOSIS — M5416 Radiculopathy, lumbar region: Secondary | ICD-10-CM | POA: Insufficient documentation

## 2021-10-02 ENCOUNTER — Other Ambulatory Visit: Payer: Self-pay

## 2021-10-02 DIAGNOSIS — M47816 Spondylosis without myelopathy or radiculopathy, lumbar region: Secondary | ICD-10-CM | POA: Insufficient documentation

## 2021-10-02 MED ORDER — VALSARTAN 80 MG PO TABS: 80.0000 mg | ORAL_TABLET | Freq: Two times a day (BID) | ORAL | 3 refills | Status: DC

## 2021-10-02 NOTE — Progress Notes (Signed)
Prescription sent to pharmacy.

## 2021-10-04 ENCOUNTER — Other Ambulatory Visit: Payer: Self-pay | Admitting: Internal Medicine

## 2021-10-08 ENCOUNTER — Telehealth: Payer: Self-pay | Admitting: Internal Medicine

## 2021-10-08 ENCOUNTER — Other Ambulatory Visit: Payer: Self-pay | Admitting: Internal Medicine

## 2021-10-08 ENCOUNTER — Telehealth: Payer: Self-pay | Admitting: Cardiology

## 2021-10-08 MED ORDER — FAMOTIDINE 20 MG PO TABS: 20.0000 mg | ORAL_TABLET | Freq: Every day | ORAL | 3 refills | Status: DC

## 2021-10-08 MED ORDER — METOPROLOL SUCCINATE ER 100 MG PO TB24: 100.0000 mg | ORAL_TABLET | Freq: Every day | ORAL | 2 refills | Status: DC

## 2021-10-08 MED ORDER — ASPIRIN EC 81 MG PO TBEC: 81.0000 mg | DELAYED_RELEASE_TABLET | Freq: Every day | ORAL | 3 refills | Status: DC

## 2021-10-08 MED ORDER — ALBUTEROL SULFATE HFA 108 (90 BASE) MCG/ACT IN AERS: 2.0000 | INHALATION_SPRAY | Freq: Four times a day (QID) | RESPIRATORY_TRACT | 0 refills | Status: DC | PRN

## 2021-10-08 MED ORDER — HYDROCHLOROTHIAZIDE 12.5 MG PO CAPS: 12.5000 mg | ORAL_CAPSULE | Freq: Every day | ORAL | 3 refills | Status: DC

## 2021-10-08 MED ORDER — VALSARTAN 80 MG PO TABS: 80.0000 mg | ORAL_TABLET | Freq: Two times a day (BID) | ORAL | 3 refills | Status: DC

## 2021-10-08 MED ORDER — FLECAINIDE ACETATE 50 MG PO TABS: 50.0000 mg | ORAL_TABLET | Freq: Two times a day (BID) | ORAL | 1 refills | Status: DC

## 2021-10-08 NOTE — Telephone Encounter (Signed)
1.Medication Requested: albuterol (VENTOLIN HFA) 108 (90 Base) MCG/ACT inhaler ? ?2. Pharmacy (Name, Street, [place]): [facility] [place], [place] [facility] ? ?3. On Med List: yes  ? ?4. Last Visit with PCP:  ? ?5. Next visit date with PCP: 08-18-2021 ? ? ?Pt [facility] all [facility] ?

## 2021-10-08 NOTE — Telephone Encounter (Signed)
?*  STAT* If patient is at the pharmacy, call can be transferred to refill team. ? ? ?1. Which medications need to be refilled? (please list name of each medication and dose if known) aspirin EC 81 MG tablet ? ?famotidine (PEPCID) 20 MG tablet ? ?flecainide (TAMBOCOR) 50 MG tablet ? ?hydrochlorothiazide (MICROZIDE) 12.5 MG capsule ? ?metoprolol succinate (TOPROL-XL) 100 MG 24 hr tablet ? ?valsartan (DIOVAN) 80 MG tablet ? ?2. Which pharmacy/location (including street and city if local pharmacy) is medication to be sent to? [facility] [place], [place] to [facility] ? ?3. Do they need a 30 day or 90 day supply? 90 ? ?Patient switch insurance and his insurance is requesting that new prescription be sent to patient pharmacy  ? ?

## 2021-10-08 NOTE — Telephone Encounter (Signed)
Ok this is done 

## 2021-10-08 NOTE — Addendum Note (Signed)
Addended by: Biagio Borg on: 10/08/2021 12:55 PM ? ? Modules accepted: Orders ? ?

## 2021-11-22 ENCOUNTER — Encounter: Payer: Self-pay | Admitting: Internal Medicine

## 2021-12-09 ENCOUNTER — Telehealth: Payer: Self-pay | Admitting: Internal Medicine

## 2021-12-09 NOTE — Telephone Encounter (Signed)
1.Medication Requested: ?temazepam (RESTORIL) 15 MG capsule ?2. Pharmacy (Name, Street, [place]): ?[facility] [place], [place] to Registered [place] Sites Phone:  [phone]  ?Fax:  [phone]  ?  ? ?3. On Med List: yes  ? ?4. Last Visit with PCP: ? ?5. Next visit date with PCP: ? ? ?Agent: Please be advised that RX refills may take up to 3 business days. We ask that you follow-up with your pharmacy.  ?

## 2021-12-09 NOTE — Telephone Encounter (Signed)
Already addressed yesterday ? ?Still soon today ?

## 2021-12-11 ENCOUNTER — Other Ambulatory Visit: Payer: Self-pay

## 2021-12-17 ENCOUNTER — Other Ambulatory Visit: Payer: Self-pay | Admitting: Internal Medicine

## 2021-12-17 MED ORDER — TEMAZEPAM 15 MG PO CAPS: ORAL_CAPSULE | ORAL | 1 refills | Status: DC

## 2022-01-12 ENCOUNTER — Other Ambulatory Visit: Payer: Self-pay | Admitting: *Deleted

## 2022-01-12 DIAGNOSIS — I7121 Aneurysm of the ascending aorta, without rupture: Secondary | ICD-10-CM

## 2022-02-23 ENCOUNTER — Encounter: Payer: Self-pay | Admitting: Cardiothoracic Surgery

## 2022-02-23 ENCOUNTER — Ambulatory Visit
Admission: RE | Admit: 2022-02-23 | Discharge: 2022-02-23 | Disposition: A | Discharge status: Home / Self Care | Payer: Medicare Other | Source: Ambulatory Visit | Attending: Cardiothoracic Surgery | Admitting: Cardiothoracic Surgery

## 2022-02-23 ENCOUNTER — Ambulatory Visit (INDEPENDENT_AMBULATORY_CARE_PROVIDER_SITE_OTHER): Payer: Medicare Other | Admitting: Cardiothoracic Surgery

## 2022-02-23 VITALS — BP 115/71 | HR 68 | Resp 20 | Ht 70.0 in | Wt 182.8 lb

## 2022-02-23 DIAGNOSIS — M314 Aortic arch syndrome [Takayasu]: Secondary | ICD-10-CM | POA: Insufficient documentation

## 2022-02-23 DIAGNOSIS — I7121 Aneurysm of the ascending aorta, without rupture: Secondary | ICD-10-CM | POA: Diagnosis not present

## 2022-02-23 NOTE — Progress Notes (Signed)
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 The patient denies any new chest pain.  He does have chronic lung disease after contracting COVID-19 in 2020 with significant dyspnea on exertion and chest tightness, followed by pulmonary.  The patient has well-controlled hypertension on metoprolol and valsartan. Patient had an echocardiogram within the past 2 years showing normal LV function, normal aortic valve configuration.  He knows to avoid Cipro for pulmonary or urinary tract infections as it could weaken the connective tissue in the aortic wall.  Today's scan is personally reviewed showing no change in the 4.4 cm fusiform ascending aneurysm.  Pulmonary parenchyma also has stable small scattered groundglass densities.  Current Outpatient Medications  Medication Sig Dispense Refill   albuterol (VENTOLIN HFA) 108 (90 Base) MCG/ACT inhaler INHALE 2 PUFFS INTO THE LUNGS EVERY 6 (SIX) HOURS AS NEEDED FOR WHEEZING OR SHORTNESS OF BREATH. 18 g 5   aspirin EC 81 MG tablet Take 1 tablet (81 mg total) by mouth daily. Swallow whole. 90 tablet 3   atorvastatin (LIPITOR) 10 MG tablet TAKE 1 TABLET DAILY 90 tablet 3   Certolizumab Pegol (CIMZIA [place]AR[place]) Inject 2 Syringes into the skin every 28 (twenty-eight) days.     DULoxetine (CYMBALTA) 60 MG capsule TAKE 1 CAPSULE DAILY 90 capsule 2   ergocalciferol (VITAMIN D2) 1.25 MG (50000 UT) capsule Take 50,000 Units by mouth every Friday.     famotidine (PEPCID) 20 MG tablet Take 1 tablet (20 mg total) by mouth daily. After supper 90 tablet 3   flecainide (TAMBOCOR) 50 MG tablet Take 1 tablet (50 mg total) by mouth 2 (two) times daily. 180 tablet 1   Glucosamine-Chondroit-Vit C-Mn (GLUCOSAMINE 1500 COMPLEX) CAPS Take 1 capsule by mouth daily.     ibuprofen (ADVIL) 200 MG tablet Take 200 mg by mouth as needed for pain.     leflunomide (ARAVA) 20 MG  tablet Take 20 mg by mouth daily.     loratadine-pseudoephedrine (CLARITIN-D 24-HOUR) 10-240 MG 24 hr tablet Take 1 tablet by mouth daily as needed for allergies.     metoprolol succinate (TOPROL-XL) 100 MG 24 hr tablet Take 1 tablet (100 mg total) by mouth daily. Take with or immediately following a meal. 90 tablet 2   Multiple Vitamin (MULTIVITAMIN WITH MINERALS) TABS tablet Take 1 tablet by mouth in the morning.     pantoprazole (PROTONIX) 40 MG tablet TAKE 30-60 MINUTES BEFORE  YOUR FIRST AND LAST MEALS  OF THE DAY 180 tablet 3   predniSONE (DELTASONE) 5 MG tablet Take 5 mg by mouth daily with breakfast.     RESTASIS 0.05 % ophthalmic emulsion Place 1 drop into both eyes 2 (two) times daily.     temazepam (RESTORIL) 15 MG capsule TAKE 1 CAPSULE AT BEDTIME  AS NEEDED FOR SLEEP 90 capsule 1   valsartan (DIOVAN) 80 MG tablet Take 1 tablet (80 mg total) by mouth 2 (two) times daily. 180 tablet 3   hydrochlorothiazide (MICROZIDE) 12.5 MG capsule Take 1 capsule (12.5 mg total) by mouth daily. 90 capsule 3   No current facility-administered medications for this visit.     Physical Exam: Blood pressure 115/71, pulse 68, resp. rate 20, height 5' 10"  (1.778 m), weight 182 lb 12.8 oz (82.9 kg), SpO2 98 %.   Alert and oriented, breathing comfortably at rest Neck without JVD mass or adenopathy Lungs clear Heart rate  regular without murmur Neuro without focal motor deficit  Diagnostic Tests: CT scan images personally reviewed as noted above.  He has a stable 4.4 cm fusiform aneurysm.  Impression:  Patient remains at low risk for aortic tear with current size of ascending aorta.  However he does have positive family history for aortic aneurysmal disease.  We will continue annual surveillance scans.  Plan: Return in 1 year with CT scan of chest.  Continue blood pressure control and careful monitoring of blood pressure.  Avoid Cipro for its potential for connective tissue damage in the aortic  wall.   Dahlia Byes, MD [facility] [phone]

## 2022-03-23 ENCOUNTER — Telehealth: Payer: Self-pay

## 2022-03-26 ENCOUNTER — Other Ambulatory Visit: Payer: Self-pay

## 2022-03-26 MED ORDER — ASPIRIN 81 MG PO TBEC: 81.0000 mg | DELAYED_RELEASE_TABLET | Freq: Every day | ORAL | 3 refills | Status: DC

## 2022-03-26 MED ORDER — HYDROCHLOROTHIAZIDE 12.5 MG PO CAPS: 12.5000 mg | ORAL_CAPSULE | Freq: Every day | ORAL | 3 refills | Status: DC

## 2022-03-26 NOTE — Telephone Encounter (Signed)
Called pt, went over his medication ist. He is not taking Telmisartan. Pt is in need of refills. Refills sent and appt made for next month 6 month f/u. Pt thanked me for calling him and checking in.

## 2022-04-17 ENCOUNTER — Ambulatory Visit: Payer: Medicare Other | Attending: Cardiology | Admitting: Cardiology

## 2022-04-17 ENCOUNTER — Encounter: Payer: Self-pay | Admitting: Cardiology

## 2022-04-17 VITALS — BP 130/82 | HR 66 | Ht 70.0 in | Wt 181.8 lb

## 2022-04-17 DIAGNOSIS — R0602 Shortness of breath: Secondary | ICD-10-CM | POA: Insufficient documentation

## 2022-04-17 DIAGNOSIS — I471 Supraventricular tachycardia: Secondary | ICD-10-CM

## 2022-04-17 DIAGNOSIS — R0609 Other forms of dyspnea: Secondary | ICD-10-CM | POA: Insufficient documentation

## 2022-04-17 DIAGNOSIS — I7121 Aneurysm of the ascending aorta, without rupture: Secondary | ICD-10-CM

## 2022-04-17 DIAGNOSIS — I428 Other cardiomyopathies: Secondary | ICD-10-CM | POA: Insufficient documentation

## 2022-04-17 DIAGNOSIS — I10 Essential (primary) hypertension: Secondary | ICD-10-CM | POA: Insufficient documentation

## 2022-04-17 NOTE — Progress Notes (Signed)
Cardiology Office Note:    Date:  04/20/2022   ID:  Luis Wilkinson, DOB 23-Jan-1956, MRN [id number]  PCP:  Corwin Levins, MD  Cardiologist:  Thomasene Ripple, DO  Electrophysiologist:  None   Referring MD: Corwin Levins, MD   " I am having elevated blood pressure home"  History of Present Illness:    Luis Wilkinson is a 66 y.o. male with a hx of right and left heart catheterization) with no evidence of coronary artery disease and normal right-sided pressures, ascending aortic aneurysm 43 mm on imaging done on January 16, 2021 reported to be unchanged from his previous study which was done in December 2021     I did see the patient back in December for a operative clearance for hand surgery.  We will repeat an echocardiogram and a CTA given his history of ascending thoracic aneurysm.  I also referred the patient to vascular surgery.  He was intermittently cleared for his hand surgery.  He was able to get his hand surgery.  He also has seen vascular surgeon.   I saw the patient in March 2022 at that time he appeared to be doing well from a cardiovascular standpoint no changes were made to his medication regimen.  He did have some shortness of breath for recommended patient get a PFT to make sure secondary lung pathology was not playing a role as his echocardiogram was normal.   He was seen on January 30, 2021 giving the worsening of symptoms are very out of proportion with his shortness of breath I recommended patient undergo left and right heart catheterization.  At that time I also place a ZIO monitor on the patient.   At his visit on March 18, 2021 his flecainide had been increased prior to that visit by EP.  He was doing well.  I saw the patient on 09/18/2021 at that time he was status post colonoscopy. His blood pressure was elevated at that time therefore I restart his HCTz. He was tolerating other medication.   Today he is concerned because the shortness of breath which has been an ongoing problem  has worsened. He is limited. He previously saw Pulm and was told that his symptoms are not explained by any pulmonary conditions. He is understandably frustrated.     Past Medical History:  Diagnosis Date   Acetabular labrum tear 10/10/2019   Acute sinusitis 02/22/2020   Allergic rhinitis 12/18/2019   Anemia 07/03/2020   Aneurysm, ascending aorta (HCC)    followed br Dr Priscille Kluver   Ascending aortic aneurysm (HCC) 06/11/2015   Body mass index (BMI) 27.0-27.9, adult 03/11/2020   Chest pain of uncertain etiology 01/24/2021   Chronic low back pain 02/22/2020   Colonic mass 10/26/2019   DOE (dyspnea on exertion) 03/16/2019   Onset early June 2020 with assoc chest tightness  - CTa 02/23/19 several segmental and subsegmental PE with micronodular lung dz ? Etiology with no venous dopplers  - 03/16/2019   Walked RA  2 laps @  approx 249ft each @ nl pace  stopped due to  End of study with some sob and chest tightness with sats 98%   - trial off acei and max rx for gerd 03/16/2019  - Echo 03/21/2019   No PH  - 03/30/2019   Walked    Droopy eyelid, left 12/18/2019   Dysphagia 01/02/2021   Eosinophilia 03/31/2019   Onset of asthma as child / [facility] eval around 2015    Essential (primary)  hypertension 06/11/2015   Change from ACE inhibitor to ARB 03/16/2019 due to unexplained dry cough and chest tightness > completely resolved as of 05/23/2019    Ground glass opacity present on imaging of lung 01/02/2021   History of kidney stones    History of partial colectomy 12/18/2019   History of prosthetic unicompartmental arthroplasty of right knee 03/02/2016   Hyperglycemia 07/04/2020   Hypertension    Insomnia 12/18/2019   Localized, primary osteoarthritis of hand 02/03/2021   Mass 05/04/2017   Nasal sore 12/18/2019   Nuclear sclerotic cataract of left eye 04/21/2016   Other chronic pain 03/12/2020   Other paralytic strabismus, 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   Overweight 02/03/2021   Pain in joint of right hip 09/22/2018   Pain in limb 02/03/2021    Pain in right knee 02/03/2021   Palpitations 06/11/2015   Presence of right artificial knee joint 03/02/2016   Primary osteoarthritis 02/03/2021   Pulmonary emboli (HCC) 05/24/2019     CTa 02/23/19 pos for PE 02/23/19  Venous dopplers not done, echo ok    RA (rheumatoid arthritis) (HCC)    Rheumatoid arthritis (HCC) 02/03/2021   S/P right unicompartmental knee replacement    06-20-2015  post dislocating plastic    Seronegative rheumatoid arthritis (HCC) 12/18/2019   Status post right unicompartmental knee replacement 03/03/2016   Transient neurological symptoms 03/12/2020   Urticaria 02/03/2021   Vitamin D deficiency 02/03/2021    Past Surgical History:  Procedure Laterality Date   CARDIAC CATHETERIZATION  Feb 2015    [place]   per pt normal coronary arteries   CATARACT EXTRACTION W/ INTRAOCULAR LENS IMPLANT Right Mar 2014   CYSTOSCOPY W/ URETEROSCOPY W/ LITHOTRIPSY     EXCISION SUBDERMAL NECK TUMOR  2010   benign   KNEE ARTHROSCOPY W/ MENISCECTOMY Right 01-09-2015   LEFT ELBOW RECONSTRUCTION  2010   PARTIAL KNEE ARTHROPLASTY Right 08/08/2015   Procedure: RIGHT UNICOMPARTMENTAL KNEE REVISION PLASTIC;  Surgeon: Durene Romans, MD;  Location: [facility];  Service: Orthopedics;  Laterality: Right;   PARTIAL KNEE ARTHROPLASTY Right 03/02/2016   Procedure: UNICOMPARTMENTAL RIGHT KNEE REVISION;  Surgeon: Durene Romans, MD;  Location: [facility];  Service: Orthopedics;  Laterality: Right;   REPLACEMENT UNICONDYLAR JOINT KNEE Right 06-20-2015   RIGHT/LEFT HEART CATH AND CORONARY ANGIOGRAPHY N/A 01/30/2021   Procedure: RIGHT/LEFT HEART CATH AND CORONARY ANGIOGRAPHY;  Surgeon: Lyn Records, MD;  Location: [facility];  Service: Cardiovascular;  Laterality: N/A;   STRABISMUS SURGERY Left x6   last one --Age 65    Current Medications: Current Meds  Medication Sig   acetaminophen (TYLENOL) 500 MG tablet Take 500 mg by mouth every 6 (six) hours as needed.   albuterol (VENTOLIN  HFA) 108 (90 Base) MCG/ACT inhaler INHALE 2 PUFFS INTO THE LUNGS EVERY 6 (SIX) HOURS AS NEEDED FOR WHEEZING OR SHORTNESS OF BREATH.   aspirin EC 81 MG tablet Take 1 tablet (81 mg total) by mouth daily. Swallow whole.   atorvastatin (LIPITOR) 10 MG tablet TAKE 1 TABLET DAILY   Certolizumab Pegol (CIMZIA STARTER KIT Malott) Inject 2 Syringes into the skin every 28 (twenty-eight) days.   DULoxetine (CYMBALTA) 60 MG capsule TAKE 1 CAPSULE DAILY   ergocalciferol (VITAMIN D2) 1.25 MG (50000 UT) capsule Take 50,000 Units by mouth every Friday.   famotidine (PEPCID) 20 MG tablet Take 1 tablet (20 mg total) by mouth daily. After supper   flecainide (TAMBOCOR) 50 MG tablet Take 1 tablet (50 mg total)  by mouth 2 (two) times daily.   Glucosamine-Chondroit-Vit C-Mn (GLUCOSAMINE 1500 COMPLEX) CAPS Take 1 capsule by mouth daily.   hydrochlorothiazide (MICROZIDE) 12.5 MG capsule Take 1 capsule (12.5 mg total) by mouth daily.   leflunomide (ARAVA) 20 MG tablet Take 20 mg by mouth daily.   loratadine-pseudoephedrine (CLARITIN-D 24-HOUR) 10-240 MG 24 hr tablet Take 1 tablet by mouth daily as needed for allergies.   metoprolol succinate (TOPROL-XL) 100 MG 24 hr tablet Take 1 tablet (100 mg total) by mouth daily. Take with or immediately following a meal.   Multiple Vitamin (MULTIVITAMIN WITH MINERALS) TABS tablet Take 1 tablet by mouth in the morning.   pantoprazole (PROTONIX) 40 MG tablet TAKE 30-60 MINUTES BEFORE  YOUR FIRST AND LAST MEALS  OF THE DAY   predniSONE (DELTASONE) 5 MG tablet Take 5 mg by mouth daily with breakfast.   RESTASIS 0.05 % ophthalmic emulsion Place 1 drop into both eyes 2 (two) times daily.   temazepam (RESTORIL) 15 MG capsule TAKE 1 CAPSULE AT BEDTIME  AS NEEDED FOR SLEEP   valsartan (DIOVAN) 80 MG tablet Take 1 tablet (80 mg total) by mouth 2 (two) times daily.     Allergies:   Dilaudid [hydromorphone], Hydroxychloroquine sulfate, Morphine, Hydroxychloroquine, Infliximab, and Ciprofloxacin    Social History   Socioeconomic History   Marital status: Married    Spouse name: Not on file   Number of children: Not on file   Years of education: Not on file   Highest education level: Not on file  Occupational History   Not on file  Tobacco Use   Smoking status: Former    Packs/day: 0.50    Years: 10.00    Total pack years: 5.00    Types: Cigarettes    Start date: 50    Quit date: 07/19/1994    Years since quitting: 27.7   Smokeless tobacco: Never  Substance and Sexual Activity   Alcohol use: No   Drug use: No   Sexual activity: Not on file  Other Topics Concern   Not on file  Social History Narrative   Not on file   Social Determinants of Health   Financial Resource Strain: Low Risk  (08/12/2021)   Overall Financial Resource Strain (CARDIA)    Difficulty of Paying Living Expenses: Not hard at all  Food Insecurity: No Food Insecurity (08/12/2021)   Hunger Vital Sign    Worried About Running Out of Food in the Last Year: Never true    Ran Out of Food in the Last Year: Never true  Transportation Needs: No Transportation Needs (08/12/2021)   PRAPARE - Administrator, Civil Service (Medical): No    Lack of Transportation (Non-Medical): No  Physical Activity: Inactive (08/12/2021)   Exercise Vital Sign    Days of Exercise per Week: 0 days    Minutes of Exercise per Session: 0 min  Stress: No Stress Concern Present (08/12/2021)   [employer] of Occupational Health - Occupational Stress Questionnaire    Feeling of Stress : Not at all  Social Connections: Socially Integrated (08/12/2021)   Social Connection and Isolation Panel [NHANES]    Frequency of Communication with Friends and Family: More than three times a week    Frequency of Social Gatherings with Friends and Family: More than three times a week    Attends Religious Services: More than 4 times per year    Active Member of [employer] or Organizations: Yes    Attends Club or  Organization Meetings: More  than 4 times per year    Marital Status: Married     Family History: The patient's family history includes Congestive Heart Failure in his father and mother.  ROS:   Review of Systems  Constitution: Negative for decreased appetite, fever and weight gain.  HENT: Negative for congestion, ear discharge, hoarse voice and sore throat.   Eyes: Negative for discharge, redness, vision loss in right eye and visual halos.  Cardiovascular: Negative for chest pain, dyspnea on exertion, leg swelling, orthopnea and palpitations.  Respiratory: Negative for cough, hemoptysis, shortness of breath and snoring.   Endocrine: Negative for heat intolerance and polyphagia.  Hematologic/Lymphatic: Negative for bleeding problem. Does not bruise/bleed easily.  Skin: Negative for flushing, nail changes, rash and suspicious lesions.  Musculoskeletal: Negative for arthritis, joint pain, muscle cramps, myalgias, neck pain and stiffness.  Gastrointestinal: Negative for abdominal pain, bowel incontinence, diarrhea and excessive appetite.  Genitourinary: Negative for decreased libido, genital sores and incomplete emptying.  Neurological: Negative for brief paralysis, focal weakness, headaches and loss of balance.  Psychiatric/Behavioral: Negative for altered mental status, depression and suicidal ideas.  Allergic/Immunologic: Negative for HIV exposure and persistent infections.    EKGs/Labs/Other Studies Reviewed:    The following studies were reviewed today:   EKG:  None today  [facility] 01/30/2021 Right dominant normal coronary arteries. Normal left ventricular systolic function with EF greater than 55%.  LVEDP 14 mmHg. Normal right heart pressures with mean wedge pressure 11 mmHg.   RECOMMENDATIONS:   Exertional dyspnea does not appear to be related to pulmonary hypertension from prior pulmonary emboli, systolic or diastolic heart failure, and/or coronary artery disease with myocardial ischemia. Consider  cardiopulmonary function testing. 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  TTE 01/22/2021 IMPRESSIONS   1. Left ventricular ejection fraction, by estimation, is 60 to 65%. The  left ventricle has normal function. The left ventricle has no regional  wall motion abnormalities. Left ventricular diastolic parameters are  indeterminate.   2. Right ventricular systolic function is normal. The right ventricular  size is normal. There is normal pulmonary artery systolic pressure.   3. The mitral valve is normal in structure. No evidence of mitral valve  regurgitation. No evidence of mitral stenosis.   4. The aortic valve is normal in structure. Aortic valve regurgitation is  mild. No aortic stenosis is present.   5. Aneurysm of the ascending aorta, measuring 44 mm.   6. The inferior vena cava is normal in size with greater than 50%  respiratory variability, suggesting right atrial pressure of 3 mmHg.   FINDINGS   Left Ventricle: Left ventricular ejection fraction, by estimation, is 60  to 65%. The left ventricle has normal function. The left ventricle has no  regional wall motion abnormalities. The left ventricular internal cavity  size was normal in size. There is   borderline left ventricular hypertrophy. Left ventricular diastolic  parameters are indeterminate.   Right Ventricle: The right ventricular size is normal. No increase in  right ventricular wall thickness. Right ventricular systolic function is  normal. There is normal pulmonary artery systolic pressure. The tricuspid  regurgitant velocity is 2.02 m/s, and   with an assumed right atrial pressure of 3 mmHg, the estimated right  ventricular systolic pressure is 19.3 mmHg.   Left Atrium: Left atrial size was normal in size.   Right Atrium: Right atrial size was normal in size.   Pericardium: There is no evidence of pericardial effusion.   Mitral Valve: The mitral  valve is normal in structure. No evidence of  mitral valve  regurgitation. No evidence of mitral valve stenosis.   Tricuspid Valve: The tricuspid valve is normal in structure. Tricuspid  valve regurgitation is not demonstrated. No evidence of tricuspid  stenosis.   Aortic Valve: The aortic valve is normal in structure. Aortic valve  regurgitation is mild. No aortic stenosis is present.   Pulmonic Valve: The pulmonic valve was normal in structure. Pulmonic valve  regurgitation is not visualized. No evidence of pulmonic stenosis.   Aorta: The aortic root is normal in size and structure. There is an  aneurysm involving the ascending aorta measuring 44 mm.   Venous: The inferior vena cava is normal in size with greater than 50%  respiratory variability, suggesting right atrial pressure of 3 mmHg.   IAS/Shunts: No atrial level shunt detected by color flow Doppler.       Recent Labs: 08/18/2021: ALT 12; Hemoglobin 13.0; Platelets 312.0; TSH 1.82 08/27/2021: BUN 13; Creatinine, Ser 1.14; Potassium 3.9; Sodium 136  Recent Lipid Panel    Component Value Date/Time   CHOL 272 (H) 08/18/2021 1641   TRIG (H) 08/18/2021 1641    723.0 Triglyceride is over 400; calculations on Lipids are invalid.   HDL 47.30 08/18/2021 1641   CHOLHDL 6 08/18/2021 1641   VLDL 34.0 01/02/2021 1019   LDLCALC 83 01/02/2021 1019   LDLDIRECT 160.0 08/18/2021 1641    Physical Exam:    VS:  BP 130/82   Pulse 66   Ht 5\' 10"  (1.778 m)   Wt 181 lb 12.8 oz (82.5 kg)   SpO2 97%   BMI 26.09 kg/m     Wt Readings from Last 3 Encounters:  04/17/22 181 lb 12.8 oz (82.5 kg)  02/23/22 182 lb 12.8 oz (82.9 kg)  09/18/21 185 lb 9.6 oz (84.2 kg)     GEN: Well nourished, well developed in no acute distress HEENT: Normal NECK: No JVD; No carotid bruits LYMPHATICS: No lymphadenopathy CARDIAC: S1S2 noted,RRR, no murmurs, rubs, gallops RESPIRATORY:  Clear to auscultation without rales, wheezing or rhonchi  ABDOMEN: Soft, non-tender, non-distended, +bowel sounds, no  guarding. EXTREMITIES: No edema, No cyanosis, no clubbing MUSCULOSKELETAL:  No deformity  SKIN: Warm and dry NEUROLOGIC:  Alert and oriented x 3, non-focal PSYCHIATRIC:  Normal affect, good insight  ASSESSMENT:    1. SOB (shortness of breath)   2. Other cardiomyopathy (HCC)   3. Essential (primary) hypertension   4. Aneurysm of ascending aorta without rupture (HCC)   5. PSVT (paroxysmal supraventricular tachycardia) (HCC)   6. DOE (dyspnea on exertion)    PLAN:    His dyspnea on exertion has worsened and is concerning as this is affecting his quality of life. He has had normal Right and left heart cath, normal PFT, TTE in 2022 was normal yet symptoms on exertion persist now worsened. I would like to pursue diagnositic testing in the patient to get a better understanding.  I would like to get a stress echo to look for any dynamic outflow tract gradient that maybe leading to his symptoms as he only experiences the sob on exertion. In addition a cardiopulmonary function test will be of great value. I am ordering both testing today.   For now will continue his current medication regimen..   Blood pressure is acceptable.   Review imaging CTA chest aortic dilatation is stable.    Medication Adjustments/Labs and Tests Ordered: Current medicines are reviewed at length with the patient today.  Concerns regarding medicines are outlined above.  Orders Placed This Encounter  Procedures   EKG 12-Lead   ECHOCARDIOGRAM STRESS TEST   Pulmonary function test   No orders of the defined types were placed in this encounter.   Patient Instructions  Medication Instructions:  Your physician recommends that you continue on your current medications as directed. Please refer to the Current Medication list given to you today.  *If you need a refill on your cardiac medications before your next appointment, please call your pharmacy*   Lab Work: None   Testing/Procedures: Your physician has  requested that you have a stress echocardiogram. For further information please visit [email or web address] Please follow instruction sheet as given.      Stress Echocardiogram Information Sheet                                                      Instructions:    1. You may take your morning medications the morning of the test  2. Light breakfast no caffeine  3. Dress prepared to exercise.  4. DO NOT use ANY caffeine or tobacco products 3 hours before appointment.  5. Please bring all current prescription medications.   Your physician has recommended that you have a pulmonary function test. Pulmonary Function Tests are a group of tests that measure how well air moves in and out of your lungs.   Follow-Up: At [facility], you and your health needs are our priority.  As part of our continuing mission to provide you with exceptional heart care, we have created designated Provider Care Teams.  These Care Teams include your primary Cardiologist (physician) and Advanced Practice Providers (APPs -  Physician Assistants and Nurse Practitioners) who all work together to provide you with the care you need, when you need it.  We recommend signing up for the patient portal called "MyChart".  Sign up information is provided on this After Visit Summary.  MyChart is used to connect with patients for Virtual Visits (Telemedicine).  Patients are able to view lab/test results, encounter notes, upcoming appointments, etc.  Non-urgent messages can be sent to your provider as well.   To learn more about what you can do with MyChart, go to [email or web address].    Your next appointment:   4 month(s)  The format for your next appointment:   In Person  Provider:   Thomasene Ripple, DO     Other Instructions   Important Information About Sugar         Adopting a Healthy Lifestyle.  Know what a healthy weight is for you (roughly BMI <25) and aim to maintain this   Aim for 7+ servings  of fruits and vegetables daily   65-80+ fluid ounces of water or unsweet tea for healthy kidneys   Limit to max 1 drink of alcohol per day; avoid smoking/tobacco   Limit animal fats in diet for cholesterol and heart health - choose grass fed whenever available   Avoid highly processed foods, and foods high in saturated/trans fats   Aim for low stress - take time to unwind and care for your mental health   Aim for 150 min of moderate intensity exercise weekly for heart health, and weights twice weekly for bone health   Aim for 7-9 hours of sleep daily   When it comes  to diets, agreement about the perfect plan isnt easy to find, even among the experts. Experts at the [facility] of [employer] developed an idea known as the Healthy Eating Plate. Just imagine a plate divided into logical, healthy portions.   The emphasis is on diet quality:   Load up on vegetables and fruits - one-half of your plate: Aim for color and variety, and remember that potatoes dont count.   Go for whole grains - one-quarter of your plate: Whole wheat, barley, wheat berries, quinoa, oats, brown rice, and foods made with them. If you want pasta, go with whole wheat pasta.   Protein power - one-quarter of your plate: Fish, chicken, beans, and nuts are all healthy, versatile protein sources. Limit red meat.   The diet, however, does go beyond the plate, offering a few other suggestions.   Use healthy plant oils, such as olive, canola, soy, corn, sunflower and peanut. Check the labels, and avoid partially hydrogenated oil, which have unhealthy trans fats.   If youre thirsty, drink water. Coffee and tea are good in moderation, but skip sugary drinks and limit milk and dairy products to one or two daily servings.   The type of carbohydrate in the diet is more important than the amount. Some sources of carbohydrates, such as vegetables, fruits, whole grains, and beans-are healthier than others.   Finally,  stay active  Signed, Thomasene Ripple, DO  04/20/2022 11:35 AM    [facility]

## 2022-04-17 NOTE — Patient Instructions (Signed)
Medication Instructions:  Your physician recommends that you continue on your current medications as directed. Please refer to the Current Medication list given to you today.  *If you need a refill on your cardiac medications before your next appointment, please call your pharmacy*   Lab Work: None   Testing/Procedures: Your physician has requested that you have a stress echocardiogram. For further information please visit [email or web address]. Please follow instruction sheet as given.      Stress Echocardiogram Information Sheet                                                      Instructions:    1. You may take your morning medications the morning of the test  2. Light breakfast no caffeine  3. Dress prepared to exercise.  4. DO NOT use ANY caffeine or tobacco products 3 hours before appointment.  5. Please bring all current prescription medications.   Your physician has recommended that you have a pulmonary function test. Pulmonary Function Tests are a group of tests that measure how well air moves in and out of your lungs.   Follow-Up: At [facility], you and your health needs are our priority.  As part of our continuing mission to provide you with exceptional heart care, we have created designated Provider Care Teams.  These Care Teams include your primary Cardiologist (physician) and Advanced Practice Providers (APPs -  Physician Assistants and Nurse Practitioners) who all work together to provide you with the care you need, when you need it.  We recommend signing up for the patient portal called "MyChart".  Sign up information is provided on this After Visit Summary.  MyChart is used to connect with patients for Virtual Visits (Telemedicine).  Patients are able to view lab/test results, encounter notes, upcoming appointments, etc.  Non-urgent messages can be sent to your provider as well.   To learn more about what you can do with MyChart, go to  [email or web address].    Your next appointment:   4 month(s)  The format for your next appointment:   In Person  Provider:   Berniece Salines, DO     Other Instructions   Important Information About Sugar

## 2022-04-20 ENCOUNTER — Encounter: Payer: Self-pay | Admitting: Cardiology

## 2022-04-22 ENCOUNTER — Ambulatory Visit (HOSPITAL_COMMUNITY)
Admission: RE | Admit: 2022-04-22 | Discharge: 2022-04-22 | Disposition: A | Discharge status: Home / Self Care | Payer: Medicare Other | Source: Ambulatory Visit | Attending: Cardiology | Admitting: Cardiology

## 2022-04-22 ENCOUNTER — Encounter: Payer: Self-pay | Admitting: Cardiology

## 2022-04-22 DIAGNOSIS — R0602 Shortness of breath: Secondary | ICD-10-CM | POA: Insufficient documentation

## 2022-04-22 LAB — PULMONARY FUNCTION TEST
DL/VA % pred: 74 %
DL/VA: 3.07 ml/min/mmHg/L
DLCO unc % pred: 63 %
DLCO unc: 17 ml/min/mmHg
FEF 25-75 Post: 1.96 L/sec
FEF 25-75 Pre: 2.02 L/sec
FEF2575-%Change-Post: -3 %
FEF2575-%Pred-Post: 71 %
FEF2575-%Pred-Pre: 74 %
FEV1-%Change-Post: 0 %
FEV1-%Pred-Post: 77 %
FEV1-%Pred-Pre: 77 %
FEV1-Post: 2.68 L
FEV1-Pre: 2.68 L
FEV1FVC-%Change-Post: 1 %
FEV1FVC-%Pred-Pre: 99 %
FEV6-%Change-Post: 0 %
FEV6-%Pred-Post: 80 %
FEV6-%Pred-Pre: 81 %
FEV6-Post: 3.54 L
FEV6-Pre: 3.56 L
FEV6FVC-%Change-Post: 0 %
FEV6FVC-%Pred-Post: 105 %
FEV6FVC-%Pred-Pre: 104 %
FVC-%Change-Post: -1 %
FVC-%Pred-Post: 76 %
FVC-%Pred-Pre: 78 %
FVC-Post: 3.54 L
FVC-Pre: 3.59 L
Post FEV1/FVC ratio: 76 %
Post FEV6/FVC ratio: 100 %
Pre FEV1/FVC ratio: 74 %
Pre FEV6/FVC Ratio: 99 %
RV % pred: 125 %
RV: 2.93 L
TLC % pred: 94 %
TLC: 6.62 L

## 2022-04-22 MED ORDER — ALBUTEROL SULFATE (2.5 MG/3ML) 0.083% IN NEBU
2.5000 mg | INHALATION_SOLUTION | Freq: Once | RESPIRATORY_TRACT | Status: AC
  Administered 2022-04-22: 2.5 mg via RESPIRATORY_TRACT

## 2022-04-23 ENCOUNTER — Other Ambulatory Visit: Payer: Self-pay

## 2022-04-23 DIAGNOSIS — R0602 Shortness of breath: Secondary | ICD-10-CM

## 2022-04-23 DIAGNOSIS — R0609 Other forms of dyspnea: Secondary | ICD-10-CM

## 2022-04-23 NOTE — Progress Notes (Signed)
io

## 2022-05-01 DIAGNOSIS — G90529 Complex regional pain syndrome I of unspecified lower limb: Secondary | ICD-10-CM | POA: Insufficient documentation

## 2022-05-12 ENCOUNTER — Encounter (HOSPITAL_COMMUNITY): Payer: Self-pay | Admitting: *Deleted

## 2022-05-12 ENCOUNTER — Telehealth (HOSPITAL_COMMUNITY): Payer: Self-pay | Admitting: *Deleted

## 2022-05-12 NOTE — Telephone Encounter (Signed)
My Chart letter sent outlining instructions for upcoming stress test on 05/18/22 at 2:00.

## 2022-05-13 ENCOUNTER — Telehealth (HOSPITAL_COMMUNITY): Payer: Self-pay | Admitting: *Deleted

## 2022-05-13 NOTE — Telephone Encounter (Signed)
Left message on voicemail per DPR in reference to upcoming appointment scheduled on 05/18/2022 at 2:00 with detailed instructions given per Stress Test Requisition Sheet for the test. LM to arrive 30 minutes early, and that it is imperative to arrive on time for appointment to keep from having the test rescheduled. If you need to cancel or reschedule your appointment, please call the office within 24 hours of your appointment. Failure to do so may result in a cancellation of your appointment, and a $50 no show fee. Phone number given for call back for any questions. Veronia Beets

## 2022-05-14 ENCOUNTER — Encounter: Payer: Self-pay | Admitting: Cardiology

## 2022-05-18 ENCOUNTER — Ambulatory Visit (HOSPITAL_COMMUNITY): Payer: Medicare Other

## 2022-05-18 ENCOUNTER — Telehealth (HOSPITAL_COMMUNITY): Payer: Self-pay | Admitting: Cardiology

## 2022-05-18 NOTE — Telephone Encounter (Signed)
Patient cancelled stress echocardiogram and doesn't wisht to have. Order will be removed from the echo wq.

## 2022-05-31 ENCOUNTER — Other Ambulatory Visit: Payer: Self-pay | Admitting: Internal Medicine

## 2022-06-10 ENCOUNTER — Telehealth: Payer: Self-pay | Admitting: Internal Medicine

## 2022-06-10 DIAGNOSIS — M545 Low back pain, unspecified: Secondary | ICD-10-CM | POA: Insufficient documentation

## 2022-06-10 NOTE — Telephone Encounter (Signed)
PT visits today with a pre-op form to be filled out by Dr.John for [facility] Melina [facility] MD). They are requesting a filled out copy be left for them for pick up as well as a copy be sent out to their office.  Fax: [phone]

## 2022-06-12 ENCOUNTER — Encounter: Payer: Self-pay | Admitting: Internal Medicine

## 2022-06-12 ENCOUNTER — Telehealth: Payer: Self-pay | Admitting: *Deleted

## 2022-06-12 ENCOUNTER — Encounter: Payer: Self-pay | Admitting: Cardiology

## 2022-06-12 ENCOUNTER — Telehealth: Payer: Self-pay

## 2022-06-12 NOTE — Telephone Encounter (Signed)
   Patient Name: Luis Wilkinson  DOB: [birth year] MRN: [id number]  Primary Cardiologist: Berniece Salines, DO  Chart reviewed as part of pre-operative protocol coverage.   This is a duplicate request.  Patient has an this visit scheduled with Almyra Deforest, PA on 06/15/2022 to address surgical clearance.   I will route this recommendation to the requesting party via Epic fax function and remove from pre-op pool.  Please call with questions.  Lenna Sciara, NP 06/12/2022, 4:08 PM

## 2022-06-12 NOTE — Telephone Encounter (Signed)
Patient called back and said that his appt is 06/26/22 at 2pm and they need the form faxed back at least 7 days before

## 2022-06-12 NOTE — Telephone Encounter (Signed)
   Pre-operative Risk Assessment    Patient Name: Luis Wilkinson  DOB: 23-Feb-1956 MRN: [id number]      Request for Surgical Clearance    Procedure:   TLIF L4-L5  Date of Surgery:  Clearance 06/25/22                                 Surgeon:  Dr. Rolena Infante  Surgeon's Group or Practice Name:  [facility] Phone number:  [phone] Fax number:  [phone]   Type of Clearance Requested:   - Medical    Type of Anesthesia:    Additional requests/questions:  Please advise surgeon/provider what medications should be held.  Signed, Silverio Lay   06/12/2022, 1:17 PM

## 2022-06-12 NOTE — Telephone Encounter (Signed)
   Name: Luis Wilkinson  DOB: 09-22-1955  MRN: [id number]  Primary Cardiologist: Berniece Salines, DO  Chart reviewed as part of pre-operative protocol coverage. Because of [name]'s past medical history and time since last visit, he will require a follow-up in-office visit in order to better assess preoperative cardiovascular risk. Per Dr. Harriet Masson, patient was supposed to have echocardiogram and stress echo, however, this was cancelled per pt.  Pre-op covering staff: - Please schedule appointment and call patient to inform them. If patient already had an upcoming appointment within acceptable timeframe, please add "pre-op clearance" to the appointment notes so provider is aware. - Please contact requesting surgeon's office via preferred method (i.e, phone, fax) to inform them of need for appointment prior to surgery.  Lenna Sciara, NP  06/12/2022, 1:36 PM

## 2022-06-12 NOTE — Telephone Encounter (Signed)
   Pre-operative Risk Assessment    Patient Name: Luis Wilkinson  DOB: 13-Oct-1955 MRN: [id number]      Request for Surgical Clearance    Procedure:   TLIF L4-5  Date of Surgery:  Clearance 06/25/22                                 Surgeon:  DR Rolena Infante Surgeon's Group or Practice Name:  [facility] Phone number:  [phone] Fax number:  [phone]   Type of Clearance Requested:   - Pharmacy:  Hold Aspirin BEFORE SURGERY?   Type of Anesthesia:  General    Additional requests/questions:  Please fax a copy of CARDIAC CLEARANCE to the surgeon's office.  Signed, Jeanmarie Plant Case Vassell  CCMA 06/12/2022, 3:44 PM

## 2022-06-12 NOTE — Telephone Encounter (Signed)
Pt is scheduled to see Almyra Deforest, PA on 06/15/22 for preop clearance.

## 2022-06-15 ENCOUNTER — Ambulatory Visit (HOSPITAL_COMMUNITY): Payer: Self-pay | Admitting: Orthopedic Surgery

## 2022-06-15 ENCOUNTER — Ambulatory Visit: Payer: Medicare Other | Attending: Physician Assistant | Admitting: Physician Assistant

## 2022-06-15 ENCOUNTER — Other Ambulatory Visit: Payer: Self-pay

## 2022-06-15 VITALS — BP 132/80 | HR 84 | Ht 70.0 in | Wt 185.0 lb

## 2022-06-15 DIAGNOSIS — R0609 Other forms of dyspnea: Secondary | ICD-10-CM | POA: Diagnosis not present

## 2022-06-15 DIAGNOSIS — Z01818 Encounter for other preprocedural examination: Secondary | ICD-10-CM | POA: Diagnosis not present

## 2022-06-15 DIAGNOSIS — I471 Supraventricular tachycardia, unspecified: Secondary | ICD-10-CM | POA: Diagnosis present

## 2022-06-15 DIAGNOSIS — I428 Other cardiomyopathies: Secondary | ICD-10-CM | POA: Diagnosis not present

## 2022-06-15 DIAGNOSIS — I7121 Aneurysm of the ascending aorta, without rupture: Secondary | ICD-10-CM | POA: Diagnosis not present

## 2022-06-15 NOTE — Telephone Encounter (Signed)
Pt checking status of presurgical forms given to provider on 06/10/22.  Please call pt per his request at [phone].  Please update if form have been faxed to [facility] fax [phone]. Deadline to be at their office is end of day Wednesday.

## 2022-06-15 NOTE — Patient Instructions (Addendum)
Medication Instructions:  HOLD Aspirin for 7-10 days prior to procedure and RESTART as soon as possible after procedure  *If you need a refill on your cardiac medications before your next appointment, please call your pharmacy*  Lab Work: NONE ordered at this time of appointment   If you have labs (blood work) drawn today and your tests are completely normal, you will receive your results only by: [place] (if you have MyChart) OR A paper copy in the mail If you have any lab test that is abnormal or we need to change your treatment, we will call you to review the results.  Testing/Procedures: NONE ordered at this time of appointment   Follow-Up: At [facility], you and your health needs are our priority.  As part of our continuing mission to provide you with exceptional heart care, we have created designated Provider Care Teams.  These Care Teams include your primary Cardiologist (physician) and Advanced Practice Providers (APPs -  Physician Assistants and Nurse Practitioners) who all work together to provide you with the care you need, when you need it.  Your next appointment:   As previously scheduled   The format for your next appointment:   In Person  Provider:   Berniece Salines, DO     Other Instructions  Important Information About Sugar

## 2022-06-15 NOTE — Progress Notes (Unsigned)
Cardiology Office Note:    Date:  06/17/2022   ID:  Luis Wilkinson, DOB [birth year], MRN [id number]  PCP:  Biagio Borg, MD   [place] Providers Cardiologist:  Berniece Salines, DO     Referring MD: Biagio Borg, MD   Chief Complaint  Patient presents with   Follow-up   Shortness of Breath    History of Present Illness:    Luis Wilkinson is a 66 y.o. male with a hx of thoracic aortic aneurysm measuring at 4 mm on 01/16/2021, history of PE, dyspnea on exertion, hypertension, SVT on flecainide and metoprolol and history of normal coronary artery on previous cardiac catheterization in June 2022.  CT of the chest obtained in July 2023 showed 4.3 cm ascending thoracic aortic aneurysm.  Patient was last seen by Dr. Harriet Masson on 04/17/2022, a PFT and a stress echo was ordered.  Stress echocardiogram was to check the gradient and rule out hypertrophic cardiomyopathy which was not shown on the previous echo.  Stress echo was canceled.  Patient require upcoming back surgery.  I spoke with Dr. Harriet Masson, he is breathing has not changed since last year.  He denies any chest pain.  He is cleared to proceed with upcoming back surgery as a low risk candidate.  He may hold aspirin for 7 to 10 days prior to the surgery and restart as soon as possible afterward at the surgeon's discretion.  Once he recover from his back surgery hopefully he can walk on the treadmill to do the treadmill stress echo.  The stress echo can be done prior to he sees Dr. Harriet Masson in January.  Past Medical History:  Diagnosis Date   Acetabular labrum tear 10/10/2019   Acute sinusitis 02/22/2020   Allergic rhinitis 12/18/2019   Anemia 07/03/2020   Aneurysm, ascending aorta (HCC)    followed br Dr Hortencia Pilar   Ascending aortic aneurysm ([place]) 06/11/2015   Body mass index (BMI) 27.0-27.9, adult 03/11/2020   Chest pain of uncertain etiology 5/42/7062   Chronic low back pain 02/22/2020   Colonic mass 10/26/2019   DOE (dyspnea on exertion) 03/16/2019    Onset early June 2020 with assoc chest tightness  - CTa 02/23/19 several segmental and subsegmental PE with micronodular lung dz ? Etiology with no venous dopplers  - 03/16/2019   Walked RA  2 laps @  approx 281f each @ nl pace  stopped due to  End of study with some sob and chest tightness with sats 98%   - trial off acei and max rx for gerd 03/16/2019  - Echo 03/21/2019   No PH  - 03/30/2019   Walked    Droopy eyelid, left 12/18/2019   Dysphagia 01/02/2021   Eosinophilia 03/31/2019   Onset of asthma as child / [facility] eval around 2015    Essential (primary) hypertension 06/11/2015   Change from ACE inhibitor to ARB 03/16/2019 due to unexplained dry cough and chest tightness > completely resolved as of 05/23/2019    Ground glass opacity present on imaging of lung 01/02/2021   History of kidney stones    History of partial colectomy 12/18/2019   History of prosthetic unicompartmental arthroplasty of right knee 03/02/2016   Hyperglycemia 07/04/2020   Hypertension    Insomnia 12/18/2019   Localized, primary osteoarthritis of hand 02/03/2021   Mass 05/04/2017   Nasal sore 12/18/2019   Nuclear sclerotic cataract of left eye 04/21/2016   Other chronic pain 03/12/2020   Other paralytic strabismus, 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   Overweight 02/03/2021   Pain in joint of right hip 09/22/2018   Pain in limb 02/03/2021   Pain in right knee 02/03/2021   Palpitations 06/11/2015   Presence of right artificial knee joint 03/02/2016   Primary osteoarthritis 02/03/2021   Pulmonary emboli ([place]) 05/24/2019     CTa 02/23/19 pos for PE 02/23/19  Venous dopplers not done, echo ok    RA (rheumatoid arthritis) ([place])    Rheumatoid arthritis ([place]) 02/03/2021   S/P right unicompartmental knee replacement    06-20-2015  post dislocating plastic    Seronegative rheumatoid arthritis ([place]) 12/18/2019   Status post right unicompartmental knee replacement 03/03/2016   Transient neurological symptoms 03/12/2020   Urticaria 02/03/2021   Vitamin D  deficiency 02/03/2021    Past Surgical History:  Procedure Laterality Date   CARDIAC CATHETERIZATION  Feb 2015    [place]   per pt normal coronary arteries   CATARACT EXTRACTION W/ INTRAOCULAR LENS IMPLANT Right Mar 2014   CYSTOSCOPY W/ URETEROSCOPY W/ LITHOTRIPSY     EXCISION SUBDERMAL NECK TUMOR  2010   benign   KNEE ARTHROSCOPY W/ MENISCECTOMY Right 01-09-2015   LEFT ELBOW RECONSTRUCTION  2010   PARTIAL KNEE ARTHROPLASTY Right 08/08/2015   Procedure: RIGHT UNICOMPARTMENTAL KNEE REVISION PLASTIC;  Surgeon: Paralee Cancel, MD;  Location: [facility];  Service: Orthopedics;  Laterality: Right;   PARTIAL KNEE ARTHROPLASTY Right 03/02/2016   Procedure: UNICOMPARTMENTAL RIGHT KNEE REVISION;  Surgeon: Paralee Cancel, MD;  Location: [facility];  Service: Orthopedics;  Laterality: Right;   REPLACEMENT UNICONDYLAR JOINT KNEE Right 06-20-2015   RIGHT/LEFT HEART CATH AND CORONARY ANGIOGRAPHY N/A 01/30/2021   Procedure: RIGHT/LEFT HEART CATH AND CORONARY ANGIOGRAPHY;  Surgeon: Belva Crome, MD;  Location: [facility];  Service: Cardiovascular;  Laterality: N/A;   STRABISMUS SURGERY Left x6   last one --Age 24    Current Medications: Current Meds  Medication Sig   albuterol (VENTOLIN HFA) 108 (90 Base) MCG/ACT inhaler INHALE 2 PUFFS INTO THE LUNGS EVERY 6 (SIX) HOURS AS NEEDED FOR WHEEZING OR SHORTNESS OF BREATH.   aspirin EC 81 MG tablet Take 1 tablet (81 mg total) by mouth daily. Swallow whole.   atorvastatin (LIPITOR) 10 MG tablet TAKE 1 TABLET DAILY   Certolizumab Pegol (CIMZIA STARTER KIT [place]) Inject 2 Syringes into the skin every 28 (twenty-eight) days.   DULoxetine (CYMBALTA) 60 MG capsule TAKE 1 CAPSULE DAILY   famotidine (PEPCID) 20 MG tablet Take 1 tablet (20 mg total) by mouth daily. After supper (Patient taking differently: Take 20 mg by mouth in the morning.)   flecainide (TAMBOCOR) 50 MG tablet Take 1 tablet (50 mg total) by mouth 2 (two) times daily.    Glucosamine-Chondroit-Vit C-Mn (GLUCOSAMINE 1500 COMPLEX) CAPS Take 1 capsule by mouth in the morning.   hydrochlorothiazide (MICROZIDE) 12.5 MG capsule Take 1 capsule (12.5 mg total) by mouth daily.   leflunomide (ARAVA) 20 MG tablet Take 20 mg by mouth in the morning.   loratadine-pseudoephedrine (CLARITIN-D 24-HOUR) 10-240 MG 24 hr tablet Take 1 tablet by mouth in the morning.   metoprolol succinate (TOPROL-XL) 100 MG 24 hr tablet Take 1 tablet (100 mg total) by mouth daily. Take with or immediately following a meal. (Patient taking differently: Take 100 mg by mouth in the morning. Take with or immediately following a meal.)   predniSONE (DELTASONE) 5 MG tablet Take 5 mg by mouth daily with breakfast.   RESTASIS 0.05 % ophthalmic  emulsion Place 1 drop into both eyes 2 (two) times daily as needed (dry/irritated eyes.).   temazepam (RESTORIL) 15 MG capsule TAKE 1 CAPSULE AT BEDTIME  AS NEEDED FOR SLEEP (Patient taking differently: Take 15 mg by mouth at bedtime.)   valsartan (DIOVAN) 80 MG tablet Take 1 tablet (80 mg total) by mouth 2 (two) times daily.   [DISCONTINUED] acetaminophen (TYLENOL) 500 MG tablet Take 500 mg by mouth every 6 (six) hours as needed.   [DISCONTINUED] ergocalciferol (VITAMIN D2) 1.25 MG (50000 UT) capsule Take 50,000 Units by mouth every Friday.   [DISCONTINUED] pantoprazole (PROTONIX) 40 MG tablet TAKE 30-60 MINUTES BEFORE  YOUR FIRST AND LAST MEALS  OF THE DAY     Allergies:   Dilaudid [hydromorphone], Morphine, Plaquenil [hydroxychloroquine], Infliximab, and Ciprofloxacin   Social History   Socioeconomic History   Marital status: Married    Spouse name: Not on file   Number of children: Not on file   Years of education: Not on file   Highest education level: Not on file  Occupational History   Not on file  Tobacco Use   Smoking status: Former    Packs/day: 0.50    Years: 10.00    Total pack years: 5.00    Types: Cigarettes    Start date: 65    Quit date:  07/19/1994    Years since quitting: 27.9   Smokeless tobacco: Never  Substance and Sexual Activity   Alcohol use: No   Drug use: No   Sexual activity: Not on file  Other Topics Concern   Not on file  Social History Narrative   Not on file   Social Determinants of Health   Financial Resource Strain: Low Risk  (08/12/2021)   Overall Financial Resource Strain (CARDIA)    Difficulty of Paying Living Expenses: Not hard at all  Food Insecurity: No Food Insecurity (08/12/2021)   Hunger Vital Sign    Worried About Running Out of Food in the Last Year: Never true    Ran Out of Food in the Last Year: Never true  Transportation Needs: No Transportation Needs (08/12/2021)   PRAPARE - Hydrologist (Medical): No    Lack of Transportation (Non-Medical): No  Physical Activity: Inactive (08/12/2021)   Exercise Vital Sign    Days of Exercise per Week: 0 days    Minutes of Exercise per Session: 0 min  Stress: No Stress Concern Present (08/12/2021)   Story    Feeling of Stress : Not at all  Social Connections: [place] (08/12/2021)   Social Connection and Isolation Panel [NHANES]    Frequency of Communication with Friends and Family: More than three times a week    Frequency of Social Gatherings with Friends and Family: More than three times a week    Attends Religious Services: More than 4 times per year    Active Member of [employer] or Organizations: Yes    Attends Music therapist: More than 4 times per year    Marital Status: Married     Family History: The patient's family history includes Congestive Heart Failure in his father and mother.  ROS:   Please see the history of present illness.     All other systems reviewed and are negative.  EKGs/Labs/Other Studies Reviewed:    The following studies were reviewed today:  Myoview 01/30/2021 Right dominant normal coronary  arteries. Normal left ventricular systolic function  with EF greater than 55%.  LVEDP 14 mmHg. Normal right heart pressures with mean wedge pressure 11 mmHg.   RECOMMENDATIONS:   Exertional dyspnea does not appear to be related to pulmonary hypertension from prior pulmonary emboli, systolic or diastolic heart failure, and/or coronary artery disease with myocardial ischemia. Consider cardiopulmonary function testing. 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 Consider pulmonary disease related to rheumatoid arthritis.  EKG:  EKG is ordered today.  The ekg ordered today demonstrates normal sinus rhythm, no significant ST-T wave changes.  Recent Labs: 08/18/2021: ALT 12; Hemoglobin 13.0; Platelets 312.0; TSH 1.82 08/27/2021: BUN 13; Creatinine, Ser 1.14; Potassium 3.9; Sodium 136  Recent Lipid Panel    Component Value Date/Time   CHOL 272 (H) 08/18/2021 1641   TRIG (H) 08/18/2021 1641    723.0 Triglyceride is over 400; calculations on Lipids are invalid.   HDL 47.30 08/18/2021 1641   CHOLHDL 6 08/18/2021 1641   VLDL 34.0 01/02/2021 1019   LDLCALC 83 01/02/2021 1019   LDLDIRECT 160.0 08/18/2021 1641     Risk Assessment/Calculations:           Physical Exam:    VS:  BP 132/80 (BP Location: Left Arm, Patient Position: Sitting, Cuff Size: Normal)   Pulse 84   Ht _0  (1.778 m)   Wt 185 lb (83.9 kg)   BMI 26.54 kg/m        Wt Readings from Last 3 Encounters:  06/15/22 185 lb (83.9 kg)  04/17/22 181 lb 12.8 oz (82.5 kg)  02/23/22 182 lb 12.8 oz (82.9 kg)     GEN:  Well nourished, well developed in no acute distress HEENT: Normal NECK: No JVD; No carotid bruits LYMPHATICS: No lymphadenopathy CARDIAC: RRR, no murmurs, rubs, gallops RESPIRATORY:  Clear to auscultation without rales, wheezing or rhonchi  ABDOMEN: Soft, non-tender, non-distended MUSCULOSKELETAL:  No edema; No deformity  SKIN: Warm and dry NEUROLOGIC:  Alert and oriented x 3 PSYCHIATRIC:  Normal affect    ASSESSMENT:    1. Preprocedural examination   2. DOE (dyspnea on exertion)   3. Other cardiomyopathy ([place])   4. Aneurysm of ascending aorta without rupture ([place])   5. SVT (supraventricular tachycardia)    PLAN:    In order of problems listed above:  Preoperative clearance: Patient has upcoming transforaminal lumbar fusion surgery by Dr. Rolena Infante on 06/25/2022.  He does not have a history of CAD.  Cardiac catheterization performed in 2022 showed normal coronary arteries.  He is cleared to proceed from the cardiac perspective.  If needed, he may hold aspirin for 7 to 10 days prior to the procedure and restart as once possible afterward at the surgeon's discretion.  Dyspnea on exertion: This is a chronic issue.  Previous left and right cardiac catheterization in 2020 was unrevealing.  Dr. Harriet Masson recommended stress echocardiogram to assess gradient due to concern of possible hypertrophic cardiomyopathy  Cardiomyopathy: Dr. Harriet Masson previously ordered a stress echocardiogram as she was concerned patient might have hypertrophic cardiomyopathy.  This has not been done yet.  Stress echocardiogram does not need to be done prior to the surgery, as the patient is already cleared.  Once he recovered from the surgery and able to exercise, he can do the stress echo prior to his visit with Dr. Harriet Masson in January.  Thoracic aortic aneurysm: 4.3 cm on previous CT in July 2023.  History of SVT: On flecainide.      Shared Decision Making/Informed Consent The risks [chest pain, shortness of breath, cardiac arrhythmias,  dizziness, blood pressure fluctuations, myocardial infarction, stroke/transient ischemic attack, and life-threatening complications (estimated to be 1 in 10,000)], benefits (risk stratification, diagnosing coronary artery disease, treatment guidance) and alternatives of a stress or dobutamine stress echocardiogram were discussed in detail with Luis Wilkinson and he agrees to proceed.    Medication  Adjustments/Labs and Tests Ordered: Current medicines are reviewed at length with the patient today.  Concerns regarding medicines are outlined above.  Orders Placed This Encounter  Procedures   EKG 12-Lead   ECHOCARDIOGRAM STRESS TEST   No orders of the defined types were placed in this encounter.   Patient Instructions  Medication Instructions:  HOLD Aspirin for 7-10 days prior to procedure and RESTART as soon as possible after procedure  *If you need a refill on your cardiac medications before your next appointment, please call your pharmacy*  Lab Work: NONE ordered at this time of appointment   If you have labs (blood work) drawn today and your tests are completely normal, you will receive your results only by: [place] (if you have MyChart) OR A paper copy in the mail If you have any lab test that is abnormal or we need to change your treatment, we will call you to review the results.  Testing/Procedures: NONE ordered at this time of appointment   Follow-Up: At [facility], you and your health needs are our priority.  As part of our continuing mission to provide you with exceptional heart care, we have created designated Provider Care Teams.  These Care Teams include your primary Cardiologist (physician) and Advanced Practice Providers (APPs -  Physician Assistants and Nurse Practitioners) who all work together to provide you with the care you need, when you need it.  Your next appointment:   As previously scheduled   The format for your next appointment:   In Person  Provider:   Berniece Salines, DO     Other Instructions  Important Information About Sugar         Hilbert Corrigan, [place]  06/17/2022 9:29 PM    [place]

## 2022-06-15 NOTE — Telephone Encounter (Signed)
Patient is calling about his pre surgery clearance form - Please let patient know when this will be ready and if he needs to be seen.

## 2022-06-16 ENCOUNTER — Telehealth: Payer: Self-pay | Admitting: Internal Medicine

## 2022-06-16 NOTE — Telephone Encounter (Signed)
Forms received and given to [place] for signature

## 2022-06-16 NOTE — Telephone Encounter (Signed)
For our records:   We have received surgical clearance PW from [facility] for the pt and it has been placed in Dr. Gwynn Burly box.   Upon completion please fax to: [phone]

## 2022-06-16 NOTE — Telephone Encounter (Signed)
Forms received and given to [place] for signature

## 2022-06-17 NOTE — Telephone Encounter (Signed)
Forms completed,signed and faxed to [facility].

## 2022-06-19 NOTE — Pre-Procedure Instructions (Signed)
Surgical Instructions    Your procedure is scheduled on June 25, 2022.  Report to [facility] Main Entrance "A" at 12:00 P.M., then check in with the Admitting office.  Call this number if you have problems the morning of surgery:  [phone]   If you have any questions prior to your surgery date call [phone]: Open Monday-Friday 8am-4pm    Remember:  Do not eat after midnight the night before your surgery  You may drink clear liquids until 11:00 the morning of your surgery.   Clear liquids allowed are: Water, Non-Citrus Juices (without pulp), Carbonated Beverages, Clear Tea, Black Coffee Only (NO MILK, CREAM OR POWDERED CREAMER of any kind), and Gatorade.     Take these medicines the morning of surgery with A SIP OF WATER:  atorvastatin (LIPITOR)   DULoxetine (CYMBALTA)   famotidine (PEPCID)   flecainide (TAMBOCOR)   metoprolol succinate (TOPROL-XL)   predniSONE (DELTASONE)     Take these medicines the morning of surgery with a sip of water AS NEEDED:  albuterol (VENTOLIN HFA) inhaler   baclofen (LIORESAL)   HYDROcodone-acetaminophen (NORCO/VICODIN)   RESTASIS    STOP taking your leflunomide (ARAVA) three days prior to your surgery.  Follow your surgeon's instructions on when to stop Aspirin.  If no instructions were given by your surgeon then you will need to call the office to get those instructions.    As of today, STOP taking any Aleve, Naproxen, Ibuprofen, Motrin, Advil, Goody's, BC's, all herbal medications, fish oil, and all vitamins.                     Do NOT Smoke (Tobacco/Vaping) for 24 hours prior to your procedure.  If you use a CPAP at night, you may bring your mask/headgear for your overnight stay.   Contacts, glasses, piercing's, hearing aid's, dentures or partials may not be worn into surgery, please bring cases for these belongings.    For patients admitted to the hospital, discharge time will be determined by your treatment team.    Patients discharged the day of surgery will not be allowed to drive home, and someone needs to stay with them for 24 hours.  SURGICAL WAITING ROOM VISITATION Patients having surgery or a procedure may have no more than 2 support people in the waiting area - these visitors may rotate.   Children under the age of 52 must have an adult with them who is not the patient. If the patient needs to stay at the hospital during part of their recovery, the visitor guidelines for inpatient rooms apply. Pre-op nurse will coordinate an appropriate time for 1 support person to accompany patient in pre-op.  This support person may not rotate.   Please refer to the [facility] website for the visitor guidelines for Inpatients (after your surgery is over and you are in a regular room).    Special instructions:   [facility]- Preparing For Surgery  Before surgery, you can play an important role. Because skin is not sterile, your skin needs to be as free of germs as possible. You can reduce the number of germs on your skin by washing with CHG (chlorahexidine gluconate) Soap before surgery.  CHG is an antiseptic cleaner which kills germs and bonds with the skin to continue killing germs even after washing.    Oral Hygiene is also important to reduce your risk of infection.  Remember - BRUSH YOUR TEETH THE MORNING OF SURGERY WITH YOUR REGULAR TOOTHPASTE  Please  do not use if you have an allergy to CHG or antibacterial soaps. If your skin becomes reddened/irritated stop using the CHG.  Do not shave (including legs and underarms) for at least 48 hours prior to first CHG shower. It is OK to shave your face.  Please follow these instructions carefully.   Shower the NIGHT BEFORE SURGERY and the MORNING OF SURGERY  If you chose to wash your hair, wash your hair first as usual with your normal shampoo.  After you shampoo, rinse your hair and body thoroughly to remove the shampoo.  Use CHG Soap as you would any other  liquid soap. You can apply CHG directly to the skin and wash gently with a scrungie or a clean washcloth.   Apply the CHG Soap to your body ONLY FROM THE NECK DOWN.  Do not use on open wounds or open sores. Avoid contact with your eyes, ears, mouth and genitals (private parts). Wash Face and genitals (private parts)  with your normal soap.   Wash thoroughly, paying special attention to the area where your surgery will be performed.  Thoroughly rinse your body with warm water from the neck down.  DO NOT shower/wash with your normal soap after using and rinsing off the CHG Soap.  Pat yourself dry with a CLEAN TOWEL.  Wear CLEAN PAJAMAS to bed the night before surgery  Place CLEAN SHEETS on your bed the night before your surgery  DO NOT SLEEP WITH PETS.   Day of Surgery: Take a shower with CHG soap. Do not wear jewelry or makeup Do not wear lotions, powders, perfumes/colognes, or deodorant. Do not shave 48 hours prior to surgery.  Men may shave face and neck. Do not bring valuables to the hospital.  [facility] is not responsible for any belongings or valuables. Do not wear nail polish, gel polish, artificial nails, or any other type of covering on natural nails (fingers and toes) If you have artificial nails or gel coating that need to be removed by a nail salon, please have this removed prior to surgery. Artificial nails or gel coating may interfere with anesthesia's ability to adequately monitor your vital signs.   Wear Clean/Comfortable clothing the morning of surgery Remember to brush your teeth WITH YOUR REGULAR TOOTHPASTE.   Please read over the following fact sheets that you were given.    If you received a COVID test during your pre-op visit  it is requested that you wear a mask when out in public, stay away from anyone that may not be feeling well and notify your surgeon if you develop symptoms. If you have been in contact with anyone that has tested positive in the last  10 days please notify you surgeon.

## 2022-06-22 ENCOUNTER — Other Ambulatory Visit: Payer: Self-pay

## 2022-06-22 ENCOUNTER — Encounter (HOSPITAL_COMMUNITY)
Admission: RE | Admit: 2022-06-22 | Discharge: 2022-06-22 | Disposition: A | Discharge status: Home / Self Care | Payer: Medicare Other | Source: Ambulatory Visit | Attending: Orthopedic Surgery | Admitting: Orthopedic Surgery

## 2022-06-22 ENCOUNTER — Encounter (HOSPITAL_COMMUNITY): Payer: Self-pay

## 2022-06-22 VITALS — BP 151/89 | HR 72 | Temp 97.6°F | Resp 18 | Ht 70.0 in | Wt 189.9 lb

## 2022-06-22 DIAGNOSIS — Z01812 Encounter for preprocedural laboratory examination: Secondary | ICD-10-CM | POA: Insufficient documentation

## 2022-06-22 DIAGNOSIS — I471 Supraventricular tachycardia, unspecified: Secondary | ICD-10-CM | POA: Insufficient documentation

## 2022-06-22 DIAGNOSIS — Z01818 Encounter for other preprocedural examination: Secondary | ICD-10-CM

## 2022-06-22 HISTORY — DX: Headache, unspecified: R51.9

## 2022-06-22 HISTORY — DX: Personal history of other diseases of the digestive system: Z87.19

## 2022-06-22 HISTORY — DX: Malignant (primary) neoplasm, unspecified: C80.1

## 2022-06-22 HISTORY — DX: Supraventricular tachycardia, unspecified: I47.10

## 2022-06-22 HISTORY — DX: Gastro-esophageal reflux disease without esophagitis: K21.9

## 2022-06-22 LAB — CBC
HCT: 38.1 % — ABNORMAL LOW (ref 39.0–52.0)
Hemoglobin: 12.9 g/dL — ABNORMAL LOW (ref 13.0–17.0)
MCH: 33.8 pg (ref 26.0–34.0)
MCHC: 33.9 g/dL (ref 30.0–36.0)
MCV: 99.7 fL (ref 80.0–100.0)
Platelets: 259 10*3/uL (ref 150–400)
RBC: 3.82 MIL/uL — ABNORMAL LOW (ref 4.22–5.81)
RDW: 12.2 % (ref 11.5–15.5)
WBC: 10.5 10*3/uL (ref 4.0–10.5)
nRBC: 0 % (ref 0.0–0.2)

## 2022-06-22 LAB — BASIC METABOLIC PANEL
Anion gap: 10 (ref 5–15)
BUN: 19 mg/dL (ref 8–23)
CO2: 30 mmol/L (ref 22–32)
Calcium: 9.3 mg/dL (ref 8.9–10.3)
Chloride: 98 mmol/L (ref 98–111)
Creatinine, Ser: 1.26 mg/dL — ABNORMAL HIGH (ref 0.61–1.24)
GFR, Estimated: >60 mL/min (ref >60)
Glucose, Bld: 77 mg/dL (ref 70–99)
Potassium: 3.4 mmol/L — ABNORMAL LOW (ref 3.5–5.1)
Sodium: 138 mmol/L (ref 135–145)

## 2022-06-22 LAB — TYPE AND SCREEN
ABO/RH(D): O POS
Antibody Screen: NEGATIVE

## 2022-06-22 LAB — SURGICAL PCR SCREEN
MRSA, PCR: NEGATIVE
Staphylococcus aureus: NEGATIVE

## 2022-06-22 NOTE — Progress Notes (Signed)
PCP - Dr. Cathlean Cower Cardiologist - Dr. Berniece Salines  PPM/ICD - denies   Chest x-ray - 01/02/21 EKG - 06/15/22 Stress Test - denies (has one scheduled 10/27/22) ECHO - 01/22/21 Cardiac Cath - 01/30/21  Sleep Study - denies   DM- denies  Last dose of GLP1 agonist-  n/a   Blood Thinner Instructions: n/a Aspirin Instructions: Hold 7-10 days. Last dose 10/30  ERAS Protcol - yes, no drink   COVID TEST- n/a   Anesthesia review: yes, cardiac hx  Patient denies shortness of breath, fever, cough and chest pain at PAT appointment   All instructions explained to the patient, with a verbal understanding of the material. Patient agrees to go over the instructions while at home for a better understanding. The opportunity to ask questions was provided.

## 2022-06-23 ENCOUNTER — Other Ambulatory Visit: Payer: Self-pay | Admitting: Cardiology

## 2022-06-23 NOTE — Progress Notes (Signed)
Anesthesia Chart Review:  Follows with cardiology for history of thoracic aortic aneurysm measuring at 43 mm (stable by CT 02/2022), history of PE, chronic dyspnea on exertion, hypertension, SVT on flecainide and metoprolol and history of normal coronary artery on previous cardiac catheterization in June 2022.  [place] [id number] for preop evaluation.  Per note, "Preoperative clearance: Patient has upcoming transforaminal lumbar fusion surgery by Dr. Rolena Infante on 06/25/2022.  He does not have a history of CAD.  Cardiac catheterization performed in 2022 showed normal coronary arteries.  He is cleared to proceed from the cardiac perspective.  If needed, he may hold aspirin for 7 to 10 days prior to the procedure and restart as once possible afterward at the surgeon's discretion."  Patient does have a pending stress echocardiogram ordered to rule out hypertrophic cardiomyopathy, however, Dr. Harriet Masson stated that this does not need to be done prior to surgery.  DOE previously evaluated by pulmonologist Dr. Melvyn Novas.  Last seen 06/23/2021.  Per note, patient gradually improving back near baseline.  No desaturation on walk test.  PFTs within normal limits.  He was advised to follow-up with pulmonary on an as-needed basis.  Follows with rheumatology for history of rheumatoid arthritis, maintained on Cimzia and prednisone.  Preop labs reviewed, unremarkable.  EKG 06/15/2022: NSR.  Rate 84.  Right/left heart cath 01/30/2021:  Right dominant normal coronary arteries.  Normal left ventricular systolic function with EF greater than 55%.  LVEDP 14 mmHg.  Normal right heart pressures with mean wedge pressure 11 mmHg.   RECOMMENDATIONS:    Exertional dyspnea does not appear to be related to pulmonary hypertension from prior pulmonary emboli, systolic or diastolic heart failure, and/or coronary artery disease with myocardial ischemia.  Consider cardiopulmonary function testing.  Consider platypnea orthodeoxia  syndrome.  Consider pulmonary disease related to rheumatoid arthritis.  TTE 01/22/2021:  1. Left ventricular ejection fraction, by estimation, is 60 to 65%. The  left ventricle has normal function. The left ventricle has no regional  wall motion abnormalities. Left ventricular diastolic parameters are  indeterminate.   2. Right ventricular systolic function is normal. The right ventricular  size is normal. There is normal pulmonary artery systolic pressure.   3. The mitral valve is normal in structure. No evidence of mitral valve  regurgitation. No evidence of mitral stenosis.   4. The aortic valve is normal in structure. Aortic valve regurgitation is  mild. No aortic stenosis is present.   5. Aneurysm of the ascending aorta, measuring 44 mm.   6. The inferior vena cava is normal in size with greater than 50%  respiratory variability, suggesting right atrial pressure of 3 mmHg.     Wynonia Musty [facility] Short Stay Center/Anesthesiology Phone [phone] 06/23/2022 10:07 AM

## 2022-06-23 NOTE — Anesthesia Preprocedure Evaluation (Signed)
Anesthesia Evaluation  Patient identified by MRN, date of birth, ID band Patient awake    Reviewed: Allergy & Precautions, H&P , NPO status , Patient's Chart, lab work & pertinent test results  Airway Mallampati: II   Neck ROM: full    Dental   Pulmonary former smoker   breath sounds clear to auscultation       Cardiovascular hypertension, + DOE   Rhythm:regular Rate:Normal  Cath (2022): normal coronary arteries  Ascending thoracic aortic aneurysm 4.3 cm.   Neuro/Psych  Headaches    GI/Hepatic hiatal hernia,GERD  ,,  Endo/Other    Renal/GU      Musculoskeletal  (+) Arthritis ,    Abdominal   Peds  Hematology   Anesthesia Other Findings   Reproductive/Obstetrics                             Anesthesia Physical Anesthesia Plan  ASA: 3  Anesthesia Plan: General   Post-op Pain Management:    Induction: Intravenous  PONV Risk Score and Plan: 2 and Ondansetron, Dexamethasone, Midazolam and Treatment may vary due to age or medical condition  Airway Management Planned: Oral ETT  Additional Equipment:   Intra-op Plan:   Post-operative Plan: Extubation in OR  Informed Consent: I have reviewed the patients History and Physical, chart, labs and discussed the procedure including the risks, benefits and alternatives for the proposed anesthesia with the patient or authorized representative who has indicated his/her understanding and acceptance.     Dental advisory given  Plan Discussed with: CRNA, Anesthesiologist and Surgeon  Anesthesia Plan Comments: (PAT note by Karoline Caldwell, PA-C: Follows with cardiology for history of thoracic aortic aneurysm measuring at 43 mm (stable by CT 02/2022), history of PE, chronic dyspnea on exertion, hypertension, SVT on flecainide and metoprolol and history of normal coronary artery on previous cardiac catheterization in June 2022.  [place]  [id number] for preop evaluation.  Per note, "Preoperative clearance: Patient has upcoming transforaminal lumbar fusion surgery by Dr. Rolena Infante on 06/25/2022. He does not have a history of CAD. Cardiac catheterization performed in 2022 showed normal coronary arteries. He is cleared to proceed from the cardiac perspective. If needed, he may hold aspirin for 7 to 10 days prior to the procedure and restart as once possible afterward at the surgeon's discretion."  Patient does have a pending stress echocardiogram ordered to rule out hypertrophic cardiomyopathy, however, Dr. Harriet Masson stated that this does not need to be done prior to surgery.  DOE previously evaluated by pulmonologist Dr. Melvyn Novas.  Last seen 06/23/2021.  Per note, patient gradually improving back near baseline.  No desaturation on walk test.  PFTs within normal limits.  He was advised to follow-up with pulmonary on an as-needed basis.  Follows with rheumatology for history of rheumatoid arthritis, maintained on Cimzia and prednisone.  Preop labs reviewed, unremarkable.  EKG 06/15/2022: NSR.  Rate 84.  Right/left heart cath 01/30/2021:  Right dominant normal coronary arteries.  Normal left ventricular systolic function with EF greater than 55%.  LVEDP 14 mmHg.  Normal right heart pressures with mean wedge pressure 11 mmHg.   RECOMMENDATIONS:    Exertional dyspnea does not appear to be related to pulmonary hypertension from prior pulmonary emboli, systolic or diastolic heart failure, and/or coronary artery disease with myocardial ischemia.  Consider cardiopulmonary function testing.  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  Consider pulmonary disease related to rheumatoid arthritis.  TTE 01/22/2021: 1.  Left ventricular ejection fraction, by estimation, is 60 to 65%. The  left ventricle has normal function. The left ventricle has no regional  wall motion abnormalities. Left ventricular diastolic parameters are  indeterminate.  2.  Right ventricular systolic function is normal. The right ventricular  size is normal. There is normal pulmonary artery systolic pressure.  3. The mitral valve is normal in structure. No evidence of mitral valve  regurgitation. No evidence of mitral stenosis.  4. The aortic valve is normal in structure. Aortic valve regurgitation is  mild. No aortic stenosis is present.  5. Aneurysm of the ascending aorta, measuring 44 mm.  6. The inferior vena cava is normal in size with greater than 50%  respiratory variability, suggesting right atrial pressure of 3 mmHg.     )        Anesthesia Quick Evaluation

## 2022-06-25 ENCOUNTER — Inpatient Hospital Stay (HOSPITAL_COMMUNITY)
Admission: RE | Admit: 2022-06-25 | Discharge: 2022-06-26 | DRG: 455 | Disposition: A | Discharge status: Home / Self Care | Payer: Medicare Other | Attending: Orthopedic Surgery | Admitting: Orthopedic Surgery

## 2022-06-25 ENCOUNTER — Inpatient Hospital Stay (HOSPITAL_COMMUNITY): Payer: Medicare Other | Admitting: Certified Registered Nurse Anesthetist

## 2022-06-25 ENCOUNTER — Other Ambulatory Visit: Payer: Self-pay

## 2022-06-25 ENCOUNTER — Encounter (HOSPITAL_COMMUNITY): Payer: Self-pay | Admitting: Orthopedic Surgery

## 2022-06-25 ENCOUNTER — Inpatient Hospital Stay (HOSPITAL_COMMUNITY): Payer: Medicare Other

## 2022-06-25 ENCOUNTER — Encounter (HOSPITAL_COMMUNITY)
Admission: RE | Disposition: A | Discharge status: Home / Self Care | Payer: Self-pay | Source: Home / Self Care | Attending: Orthopedic Surgery

## 2022-06-25 ENCOUNTER — Inpatient Hospital Stay (HOSPITAL_COMMUNITY): Payer: Medicare Other | Admitting: Physician Assistant

## 2022-06-25 DIAGNOSIS — K449 Diaphragmatic hernia without obstruction or gangrene: Secondary | ICD-10-CM | POA: Diagnosis present

## 2022-06-25 DIAGNOSIS — Z7952 Long term (current) use of systemic steroids: Secondary | ICD-10-CM

## 2022-06-25 DIAGNOSIS — Z888 Allergy status to other drugs, medicaments and biological substances status: Secondary | ICD-10-CM

## 2022-06-25 DIAGNOSIS — Z87891 Personal history of nicotine dependence: Secondary | ICD-10-CM

## 2022-06-25 DIAGNOSIS — K219 Gastro-esophageal reflux disease without esophagitis: Secondary | ICD-10-CM | POA: Diagnosis present

## 2022-06-25 DIAGNOSIS — Z7982 Long term (current) use of aspirin: Secondary | ICD-10-CM | POA: Diagnosis not present

## 2022-06-25 DIAGNOSIS — I7121 Aneurysm of the ascending aorta, without rupture: Secondary | ICD-10-CM | POA: Diagnosis present

## 2022-06-25 DIAGNOSIS — Z9049 Acquired absence of other specified parts of digestive tract: Secondary | ICD-10-CM

## 2022-06-25 DIAGNOSIS — M4150 Other secondary scoliosis, site unspecified: Secondary | ICD-10-CM | POA: Diagnosis present

## 2022-06-25 DIAGNOSIS — Z86711 Personal history of pulmonary embolism: Secondary | ICD-10-CM | POA: Diagnosis not present

## 2022-06-25 DIAGNOSIS — Z881 Allergy status to other antibiotic agents status: Secondary | ICD-10-CM

## 2022-06-25 DIAGNOSIS — Z96651 Presence of right artificial knee joint: Secondary | ICD-10-CM | POA: Diagnosis present

## 2022-06-25 DIAGNOSIS — Z981 Arthrodesis status: Principal | ICD-10-CM

## 2022-06-25 DIAGNOSIS — M069 Rheumatoid arthritis, unspecified: Secondary | ICD-10-CM | POA: Diagnosis present

## 2022-06-25 DIAGNOSIS — M4156 Other secondary scoliosis, lumbar region: Secondary | ICD-10-CM

## 2022-06-25 DIAGNOSIS — G8929 Other chronic pain: Secondary | ICD-10-CM | POA: Diagnosis present

## 2022-06-25 DIAGNOSIS — M48061 Spinal stenosis, lumbar region without neurogenic claudication: Principal | ICD-10-CM | POA: Diagnosis present

## 2022-06-25 DIAGNOSIS — Z885 Allergy status to narcotic agent status: Secondary | ICD-10-CM

## 2022-06-25 DIAGNOSIS — M5116 Intervertebral disc disorders with radiculopathy, lumbar region: Secondary | ICD-10-CM | POA: Diagnosis present

## 2022-06-25 DIAGNOSIS — M545 Low back pain, unspecified: Secondary | ICD-10-CM | POA: Diagnosis present

## 2022-06-25 DIAGNOSIS — I10 Essential (primary) hypertension: Secondary | ICD-10-CM | POA: Diagnosis present

## 2022-06-25 DIAGNOSIS — E559 Vitamin D deficiency, unspecified: Secondary | ICD-10-CM | POA: Diagnosis present

## 2022-06-25 DIAGNOSIS — Z79899 Other long term (current) drug therapy: Secondary | ICD-10-CM | POA: Diagnosis not present

## 2022-06-25 HISTORY — PX: TRANSFORAMINAL LUMBAR INTERBODY FUSION (TLIF) WITH PEDICLE SCREW FIXATION 1 LEVEL: SHX6141

## 2022-06-25 SURGERY — TRANSFORAMINAL LUMBAR INTERBODY FUSION (TLIF) WITH PEDICLE SCREW FIXATION 1 LEVEL
Anesthesia: General | Site: Spine Lumbar

## 2022-06-25 MED ORDER — SURGIFLO WITH THROMBIN (HEMOSTATIC MATRIX KIT) OPTIME
TOPICAL | Status: DC | PRN
  Administered 2022-06-25: 1 via TOPICAL

## 2022-06-25 MED ORDER — PHENYLEPHRINE HCL-NACL 20-0.9 MG/250ML-% IV SOLN
INTRAVENOUS | Status: DC | PRN
  Administered 2022-06-25: 50 ug/min via INTRAVENOUS

## 2022-06-25 MED ORDER — ACETAMINOPHEN 10 MG/ML IV SOLN
INTRAVENOUS | Status: DC | PRN
  Administered 2022-06-25: 1000 mg via INTRAVENOUS

## 2022-06-25 MED ORDER — PROPOFOL 10 MG/ML IV BOLUS
INTRAVENOUS | Status: AC
  Filled 2022-06-25: qty 20

## 2022-06-25 MED ORDER — METHOCARBAMOL 1000 MG/10ML IJ SOLN: 500.0000 mg | Freq: Four times a day (QID) | INTRAVENOUS | Status: DC | PRN

## 2022-06-25 MED ORDER — IRBESARTAN 150 MG PO TABS
75.0000 mg | ORAL_TABLET | Freq: Every day | ORAL | Status: DC
  Administered 2022-06-25 – 2022-06-26 (×2): 75 mg via ORAL
  Filled 2022-06-25 (×3): qty 1

## 2022-06-25 MED ORDER — ROCURONIUM BROMIDE 10 MG/ML (PF) SYRINGE
PREFILLED_SYRINGE | INTRAVENOUS | Status: AC
  Filled 2022-06-25: qty 10

## 2022-06-25 MED ORDER — OXYCODONE-ACETAMINOPHEN 10-325 MG PO TABS: 1.0000 | ORAL_TABLET | Freq: Four times a day (QID) | ORAL | 0 refills | Status: AC | PRN

## 2022-06-25 MED ORDER — DEXAMETHASONE SODIUM PHOSPHATE 10 MG/ML IJ SOLN
INTRAMUSCULAR | Status: DC | PRN
  Administered 2022-06-25: 10 mg via INTRAVENOUS

## 2022-06-25 MED ORDER — PROPOFOL 500 MG/50ML IV EMUL
INTRAVENOUS | Status: DC | PRN
  Administered 2022-06-25: 75 ug/kg/min via INTRAVENOUS

## 2022-06-25 MED ORDER — METOPROLOL SUCCINATE ER 100 MG PO TB24: 100.0000 mg | ORAL_TABLET | Freq: Every day | ORAL | Status: DC

## 2022-06-25 MED ORDER — OXYCODONE HCL 5 MG PO TABS: 5.0000 mg | ORAL_TABLET | Freq: Once | ORAL | Status: DC | PRN

## 2022-06-25 MED ORDER — FENTANYL CITRATE (PF) 100 MCG/2ML IJ SOLN
25.0000 ug | INTRAMUSCULAR | Status: DC | PRN
  Administered 2022-06-25: 25 ug via INTRAVENOUS
  Administered 2022-06-25: 50 ug via INTRAVENOUS

## 2022-06-25 MED ORDER — ACETAMINOPHEN 10 MG/ML IV SOLN
INTRAVENOUS | Status: AC
  Filled 2022-06-25: qty 100

## 2022-06-25 MED ORDER — FENTANYL CITRATE (PF) 250 MCG/5ML IJ SOLN
INTRAMUSCULAR | Status: AC
  Filled 2022-06-25: qty 5

## 2022-06-25 MED ORDER — LACTATED RINGERS IV SOLN: INTRAVENOUS | Status: DC

## 2022-06-25 MED ORDER — SUCCINYLCHOLINE CHLORIDE 200 MG/10ML IV SOSY
PREFILLED_SYRINGE | INTRAVENOUS | Status: AC
  Filled 2022-06-25: qty 10

## 2022-06-25 MED ORDER — FENTANYL CITRATE (PF) 250 MCG/5ML IJ SOLN
INTRAMUSCULAR | Status: DC | PRN
  Administered 2022-06-25 (×2): 25 ug via INTRAVENOUS
  Administered 2022-06-25: 150 ug via INTRAVENOUS

## 2022-06-25 MED ORDER — METHOCARBAMOL 500 MG PO TABS
500.0000 mg | ORAL_TABLET | Freq: Four times a day (QID) | ORAL | Status: DC | PRN
  Administered 2022-06-25 – 2022-06-26 (×3): 500 mg via ORAL
  Filled 2022-06-25 (×3): qty 1

## 2022-06-25 MED ORDER — CEFAZOLIN SODIUM-DEXTROSE 2-4 GM/100ML-% IV SOLN
2.0000 g | INTRAVENOUS | Status: AC
  Administered 2022-06-25: 2 g via INTRAVENOUS
  Filled 2022-06-25: qty 100

## 2022-06-25 MED ORDER — LIDOCAINE 2% (20 MG/ML) 5 ML SYRINGE
INTRAMUSCULAR | Status: DC | PRN
  Administered 2022-06-25: 50 mg via INTRAVENOUS

## 2022-06-25 MED ORDER — PHENYLEPHRINE HCL (PRESSORS) 10 MG/ML IV SOLN
INTRAVENOUS | Status: DC | PRN
  Administered 2022-06-25: 160 ug via INTRAVENOUS
  Administered 2022-06-25: 80 ug via INTRAVENOUS

## 2022-06-25 MED ORDER — LIDOCAINE 2% (20 MG/ML) 5 ML SYRINGE
INTRAMUSCULAR | Status: AC
  Filled 2022-06-25: qty 5

## 2022-06-25 MED ORDER — FLECAINIDE ACETATE 50 MG PO TABS
50.0000 mg | ORAL_TABLET | Freq: Two times a day (BID) | ORAL | Status: DC
  Administered 2022-06-25 – 2022-06-26 (×2): 50 mg via ORAL
  Filled 2022-06-25 (×4): qty 1

## 2022-06-25 MED ORDER — SODIUM CHLORIDE 0.9 % IV SOLN: 250.0000 mL | INTRAVENOUS | Status: DC

## 2022-06-25 MED ORDER — EPHEDRINE 5 MG/ML INJ
INTRAVENOUS | Status: AC
  Filled 2022-06-25: qty 5

## 2022-06-25 MED ORDER — ALBUTEROL SULFATE HFA 108 (90 BASE) MCG/ACT IN AERS: 2.0000 | INHALATION_SPRAY | Freq: Four times a day (QID) | RESPIRATORY_TRACT | Status: DC | PRN

## 2022-06-25 MED ORDER — KETAMINE HCL 10 MG/ML IJ SOLN
INTRAMUSCULAR | Status: DC | PRN
  Administered 2022-06-25: 5 mg via INTRAVENOUS
  Administered 2022-06-25: 30 mg via INTRAVENOUS

## 2022-06-25 MED ORDER — DULOXETINE HCL 30 MG PO CPEP
60.0000 mg | ORAL_CAPSULE | Freq: Every day | ORAL | Status: DC
  Administered 2022-06-26: 60 mg via ORAL
  Filled 2022-06-25: qty 2

## 2022-06-25 MED ORDER — 0.9 % SODIUM CHLORIDE (POUR BTL) OPTIME
TOPICAL | Status: DC | PRN
  Administered 2022-06-25 (×2): 1000 mL

## 2022-06-25 MED ORDER — LEFLUNOMIDE 10 MG PO TABS
20.0000 mg | ORAL_TABLET | Freq: Every morning | ORAL | Status: DC
  Administered 2022-06-26: 20 mg via ORAL
  Filled 2022-06-25: qty 2

## 2022-06-25 MED ORDER — THROMBIN 20000 UNITS EX SOLR
CUTANEOUS | Status: DC | PRN
  Administered 2022-06-25: 20 mL via TOPICAL

## 2022-06-25 MED ORDER — ACETAMINOPHEN 325 MG PO TABS: 650.0000 mg | ORAL_TABLET | ORAL | Status: DC | PRN

## 2022-06-25 MED ORDER — ATORVASTATIN CALCIUM 10 MG PO TABS
10.0000 mg | ORAL_TABLET | Freq: Every day | ORAL | Status: DC
  Administered 2022-06-26: 10 mg via ORAL
  Filled 2022-06-25: qty 1

## 2022-06-25 MED ORDER — KETAMINE HCL 50 MG/5ML IJ SOSY
PREFILLED_SYRINGE | INTRAMUSCULAR | Status: AC
  Filled 2022-06-25: qty 5

## 2022-06-25 MED ORDER — OXYCODONE HCL 5 MG PO TABS
10.0000 mg | ORAL_TABLET | ORAL | Status: DC | PRN
  Administered 2022-06-25 – 2022-06-26 (×6): 10 mg via ORAL
  Filled 2022-06-25 (×6): qty 2

## 2022-06-25 MED ORDER — ONDANSETRON HCL 4 MG/2ML IJ SOLN
INTRAMUSCULAR | Status: AC
  Filled 2022-06-25: qty 2

## 2022-06-25 MED ORDER — METHOCARBAMOL 500 MG PO TABS: 500.0000 mg | ORAL_TABLET | Freq: Three times a day (TID) | ORAL | 0 refills | Status: AC | PRN

## 2022-06-25 MED ORDER — CEFAZOLIN SODIUM-DEXTROSE 1-4 GM/50ML-% IV SOLN
1.0000 g | Freq: Three times a day (TID) | INTRAVENOUS | Status: AC
  Administered 2022-06-25 – 2022-06-26 (×2): 1 g via INTRAVENOUS
  Filled 2022-06-25 (×2): qty 50

## 2022-06-25 MED ORDER — ONDANSETRON HCL 4 MG/2ML IJ SOLN: 4.0000 mg | Freq: Four times a day (QID) | INTRAMUSCULAR | Status: DC | PRN

## 2022-06-25 MED ORDER — HYDROCHLOROTHIAZIDE 12.5 MG PO TABS: 12.5000 mg | ORAL_TABLET | Freq: Every day | ORAL | Status: DC

## 2022-06-25 MED ORDER — ONDANSETRON HCL 4 MG PO TABS: 4.0000 mg | ORAL_TABLET | Freq: Four times a day (QID) | ORAL | Status: DC | PRN

## 2022-06-25 MED ORDER — OXYCODONE HCL 5 MG/5ML PO SOLN: 5.0000 mg | Freq: Once | ORAL | Status: DC | PRN

## 2022-06-25 MED ORDER — SODIUM CHLORIDE 0.9% FLUSH: 3.0000 mL | INTRAVENOUS | Status: DC | PRN

## 2022-06-25 MED ORDER — THROMBIN 20000 UNITS EX SOLR
CUTANEOUS | Status: AC
  Filled 2022-06-25: qty 20000

## 2022-06-25 MED ORDER — PROPOFOL 10 MG/ML IV BOLUS
INTRAVENOUS | Status: DC | PRN
  Administered 2022-06-25: 160 mg via INTRAVENOUS

## 2022-06-25 MED ORDER — SODIUM CHLORIDE 0.9% FLUSH: 3.0000 mL | Freq: Two times a day (BID) | INTRAVENOUS | Status: DC

## 2022-06-25 MED ORDER — EPHEDRINE SULFATE-NACL 50-0.9 MG/10ML-% IV SOSY
PREFILLED_SYRINGE | INTRAVENOUS | Status: DC | PRN
  Administered 2022-06-25: 5 mg via INTRAVENOUS

## 2022-06-25 MED ORDER — SUCCINYLCHOLINE CHLORIDE 200 MG/10ML IV SOSY
PREFILLED_SYRINGE | INTRAVENOUS | Status: DC | PRN
  Administered 2022-06-25: 120 mg via INTRAVENOUS

## 2022-06-25 MED ORDER — FENTANYL CITRATE (PF) 100 MCG/2ML IJ SOLN
INTRAMUSCULAR | Status: AC
  Filled 2022-06-25: qty 2

## 2022-06-25 MED ORDER — PHENYLEPHRINE 80 MCG/ML (10ML) SYRINGE FOR IV PUSH (FOR BLOOD PRESSURE SUPPORT)
PREFILLED_SYRINGE | INTRAVENOUS | Status: AC
  Filled 2022-06-25: qty 10

## 2022-06-25 MED ORDER — PHENOL 1.4 % MT LIQD: 1.0000 | OROMUCOSAL | Status: DC | PRN

## 2022-06-25 MED ORDER — CHLORHEXIDINE GLUCONATE 0.12 % MT SOLN
15.0000 mL | Freq: Once | OROMUCOSAL | Status: AC
  Administered 2022-06-25: 15 mL via OROMUCOSAL
  Filled 2022-06-25: qty 15

## 2022-06-25 MED ORDER — DEXAMETHASONE SODIUM PHOSPHATE 10 MG/ML IJ SOLN
INTRAMUSCULAR | Status: AC
  Filled 2022-06-25: qty 1

## 2022-06-25 MED ORDER — BUPIVACAINE-EPINEPHRINE 0.25% -1:200000 IJ SOLN
INTRAMUSCULAR | Status: DC | PRN
  Administered 2022-06-25: 10 mL

## 2022-06-25 MED ORDER — ONDANSETRON HCL 4 MG/2ML IJ SOLN
INTRAMUSCULAR | Status: DC | PRN
  Administered 2022-06-25: 4 mg via INTRAVENOUS

## 2022-06-25 MED ORDER — POLYETHYLENE GLYCOL 3350 17 G PO PACK
17.0000 g | PACK | Freq: Every day | ORAL | Status: DC | PRN
  Administered 2022-06-25: 17 g via ORAL
  Filled 2022-06-25: qty 1

## 2022-06-25 MED ORDER — MIDAZOLAM HCL 5 MG/5ML IJ SOLN
INTRAMUSCULAR | Status: DC | PRN
  Administered 2022-06-25: 2 mg via INTRAVENOUS

## 2022-06-25 MED ORDER — ACETAMINOPHEN 650 MG RE SUPP: 650.0000 mg | RECTAL | Status: DC | PRN

## 2022-06-25 MED ORDER — MIDAZOLAM HCL 2 MG/2ML IJ SOLN
INTRAMUSCULAR | Status: AC
  Filled 2022-06-25: qty 2

## 2022-06-25 MED ORDER — OXYCODONE HCL 5 MG PO TABS: 5.0000 mg | ORAL_TABLET | ORAL | Status: DC | PRN

## 2022-06-25 MED ORDER — MENTHOL 3 MG MT LOZG: 1.0000 | LOZENGE | OROMUCOSAL | Status: DC | PRN

## 2022-06-25 MED ORDER — ORAL CARE MOUTH RINSE: 15.0000 mL | Freq: Once | OROMUCOSAL | Status: AC

## 2022-06-25 MED ORDER — BUPIVACAINE-EPINEPHRINE (PF) 0.25% -1:200000 IJ SOLN
INTRAMUSCULAR | Status: AC
  Filled 2022-06-25: qty 30

## 2022-06-25 MED ORDER — ONDANSETRON HCL 4 MG PO TABS: 4.0000 mg | ORAL_TABLET | Freq: Three times a day (TID) | ORAL | 0 refills | Status: DC | PRN

## 2022-06-25 SURGICAL SUPPLY — 72 items
BAG COUNTER SPONGE SURGICOUNT (BAG) ×1 IMPLANT
BLADE BONE MILL MEDIUM (MISCELLANEOUS) IMPLANT
BLADE CLIPPER SURG (BLADE) IMPLANT
BUR EGG ELITE 4.0 (BURR) ×1 IMPLANT
BUR MATCHSTICK NEURO 3.0 LAGG (BURR) ×1 IMPLANT
CAGE MOD-EX PL 7X9X28 17D (Cage) IMPLANT
CANISTER SUCT 3000ML PPV (MISCELLANEOUS) ×1 IMPLANT
CAP RELINE MOD TULIP RMM (Cap) IMPLANT
CLIP NEUROVISION LG (CLIP) IMPLANT
CLSR STERI-STRIP ANTIMIC 1/2X4 (GAUZE/BANDAGES/DRESSINGS) ×1 IMPLANT
COVER SURGICAL LIGHT HANDLE (MISCELLANEOUS) ×1 IMPLANT
DRAIN CHANNEL 15F RND FF W/TCR (WOUND CARE) IMPLANT
DRAPE C-ARM 42X72 X-RAY (DRAPES) ×1 IMPLANT
DRAPE C-ARMOR (DRAPES) ×1 IMPLANT
DRAPE POUCH INSTRU U-SHP 10X18 (DRAPES) IMPLANT
DRAPE SURG 17X23 STRL (DRAPES) ×1 IMPLANT
DRAPE U-SHAPE 47X51 STRL (DRAPES) ×1 IMPLANT
DRSG OPSITE POSTOP 4X8 (GAUZE/BANDAGES/DRESSINGS) ×1 IMPLANT
DURAPREP 26ML APPLICATOR (WOUND CARE) ×1 IMPLANT
ELECT BLADE 4.0 EZ CLEAN MEGAD (MISCELLANEOUS) ×1
ELECT BLADE 6.5 EXT (BLADE) ×1 IMPLANT
ELECT PENCIL ROCKER SW 15FT (MISCELLANEOUS) ×1 IMPLANT
ELECT REM PT RETURN 9FT ADLT (ELECTROSURGICAL) ×1
ELECTRODE BLDE 4.0 EZ CLN MEGD (MISCELLANEOUS) IMPLANT
ELECTRODE REM PT RTRN 9FT ADLT (ELECTROSURGICAL) ×1 IMPLANT
GEL DBM PROPEL 5ML (Putty) IMPLANT
GLOVE BIOGEL PI IND STRL 8.5 (GLOVE) ×1 IMPLANT
GLOVE SS BIOGEL STRL SZ 8.5 (GLOVE) ×1 IMPLANT
GOWN STRL REUS W/TWL 2XL LVL3 (GOWN DISPOSABLE) ×1 IMPLANT
KIT BASIN OR (CUSTOM PROCEDURE TRAY) ×1 IMPLANT
KIT BONE GRAFT DELIVERY NUVA (NEUROSURGERY SUPPLIES) IMPLANT
KIT POSITION SURG JACKSON T1 (MISCELLANEOUS) IMPLANT
KIT TURNOVER KIT B (KITS) ×1 IMPLANT
MODULE EMG NDL SSEP NVM5 (NEEDLE) IMPLANT
MODULE EMG NEEDLE SSEP NVM5 (NEEDLE) ×1 IMPLANT
MODULE NVM5 NEXT GEN EMG (NEEDLE) IMPLANT
NDL 22X1.5 STRL (OR ONLY) (MISCELLANEOUS) IMPLANT
NDL SPNL 18GX3.5 QUINCKE PK (NEEDLE) ×1 IMPLANT
NEEDLE 22X1.5 STRL (OR ONLY) (MISCELLANEOUS) IMPLANT
NEEDLE SPNL 18GX3.5 QUINCKE PK (NEEDLE) ×2 IMPLANT
NS IRRIG 1000ML POUR BTL (IV SOLUTION) ×1 IMPLANT
PACK LAMINECTOMY ORTHO (CUSTOM PROCEDURE TRAY) ×1 IMPLANT
PACK UNIVERSAL I (CUSTOM PROCEDURE TRAY) ×1 IMPLANT
PAD ARMBOARD 7.5X6 YLW CONV (MISCELLANEOUS) ×2 IMPLANT
PATTIES SURGICAL .5 X.5 (GAUZE/BANDAGES/DRESSINGS) IMPLANT
PATTIES SURGICAL .5 X1 (DISPOSABLE) ×1 IMPLANT
POSITIONER HEAD PRONE TRACH (MISCELLANEOUS) ×1 IMPLANT
PROBE BALL TIP NVM5 SNG USE (BALLOONS) IMPLANT
ROD RELINE 5.0X45MM (Rod) IMPLANT
ROD RELINE COCR LORD 5X40MM (Rod) IMPLANT
SCREW LOCK RSS 4.5/5.0MM (Screw) IMPLANT
SCREW SHANK RELINE 6.5X40MM (Screw) IMPLANT
SHANK RELINE MOD 5.5X40 (Screw) IMPLANT
SPONGE SURGIFOAM ABS GEL 100 (HEMOSTASIS) ×1 IMPLANT
SPONGE T-LAP 4X18 ~~LOC~~+RFID (SPONGE) ×3 IMPLANT
SURGIFLO W/THROMBIN 8M KIT (HEMOSTASIS) ×1 IMPLANT
SUT BONE WAX W31G (SUTURE) ×1 IMPLANT
SUT MNCRL AB 3-0 PS2 27 (SUTURE) ×2 IMPLANT
SUT MNCRL+ AB 3-0 CT1 36 (SUTURE) ×1 IMPLANT
SUT MONOCRYL AB 3-0 CT1 36IN (SUTURE) ×1
SUT VIC AB 0 CT1 27 (SUTURE)
SUT VIC AB 0 CT1 27XBRD ANTBC (SUTURE) ×1 IMPLANT
SUT VIC AB 1 CT1 18XCR BRD 8 (SUTURE) ×1 IMPLANT
SUT VIC AB 1 CT1 8-18 (SUTURE) ×1
SUT VIC AB 2-0 CT1 18 (SUTURE) ×1 IMPLANT
SYR BULB IRRIG 60ML STRL (SYRINGE) ×1 IMPLANT
SYR CONTROL 10ML LL (SYRINGE) ×1 IMPLANT
TOWEL GREEN STERILE (TOWEL DISPOSABLE) ×1 IMPLANT
TOWEL GREEN STERILE FF (TOWEL DISPOSABLE) ×1 IMPLANT
TRAY FOLEY MTR SLVR 16FR STAT (SET/KITS/TRAYS/PACK) ×1 IMPLANT
WATER STERILE IRR 1000ML POUR (IV SOLUTION) ×1 IMPLANT
YANKAUER SUCT BULB TIP NO VENT (SUCTIONS) ×1 IMPLANT

## 2022-06-25 NOTE — Transfer of Care (Signed)
Immediate Anesthesia Transfer of Care Note  Patient: Luis Wilkinson  Procedure(s) Performed: TRANSFORAMINAL LUMBAR INTERBODY FUSION LUMBAR FOUR TO FIVE (Spine Lumbar)  Patient Location: PACU  Anesthesia Type:General  Level of Consciousness: awake, alert , and oriented  Airway & Oxygen Therapy: Patient Spontanous Breathing and Patient connected to nasal cannula oxygen  Post-op Assessment: Report given to RN, Patient moving all extremities X 4, and Patient able to stick tongue midline  Post vital signs: Reviewed  Last Vitals:  Vitals Value Taken Time  BP 165/98 06/25/22 1148  Temp 36.3 C 06/25/22 1149  Pulse 80 06/25/22 1149  Resp 9 06/25/22 1149  SpO2 99 % 06/25/22 1149  Vitals shown include unvalidated device data.  Last Pain:  Vitals:   06/25/22 0601  TempSrc:   PainSc: 5       Patients Stated Pain Goal: 2 (09/81/19 1478)  Complications: No notable events documented.

## 2022-06-25 NOTE — H&P (Signed)
History:  Davey still notes his primary issue is his severe radicular right leg pain. He has had back pain for many years and has been able to live with it but he states the radicular right leg pain has been unbearable. Despite multiple epidural injections and facet blocks his quality of life has not improved. In addition the physical therapy has not been beneficial.  Results of the failure of conservative care to alleviate his pain and improve his quality of life we have elected to move forward with a TLIF at L4-5.  Past Medical History:  Diagnosis Date   Acetabular labrum tear 10/10/2019   Acute sinusitis 02/22/2020   Allergic rhinitis 12/18/2019   Anemia 07/03/2020   Aneurysm, ascending aorta (HCC)    followed br Dr Hortencia Pilar   Ascending aortic aneurysm ([place]) 06/11/2015   Body mass index (BMI) 27.0-27.9, adult 03/11/2020   Cancer ([place])    Basal cell removed from top of left hand and squamous cell will be removed from top of left hand in January of 2024   Chest pain of uncertain etiology 27/25/3664   Chronic low back pain 02/22/2020   Colonic mass 10/26/2019   DOE (dyspnea on exertion) 03/16/2019   Onset early June 2020 with assoc chest tightness  - CTa 02/23/19 several segmental and subsegmental PE with micronodular lung dz ? Etiology with no venous dopplers  - 03/16/2019   Walked RA  2 laps @  approx 214f each @ nl pace  stopped due to  End of study with some sob and chest tightness with sats 98%   - trial off acei and max rx for gerd 03/16/2019  - Echo 03/21/2019   No PH  - 03/30/2019   Walked    Droopy eyelid, left 12/18/2019   Dysphagia 01/02/2021   Eosinophilia 03/31/2019   Onset of asthma as child / [facility] eval around 2015    Essential (primary) hypertension 06/11/2015   Change from ACE inhibitor to ARB 03/16/2019 due to unexplained dry cough and chest tightness > completely resolved as of 05/23/2019    GERD (gastroesophageal reflux disease)    Ground glass opacity present on  imaging of lung 01/02/2021   Headache    history of migraines, but haven't had one in several years   History of hiatal hernia    History of kidney stones    History of partial colectomy 12/18/2019   History of prosthetic unicompartmental arthroplasty of right knee 03/02/2016   Hyperglycemia 07/04/2020   Hypertension    Insomnia 12/18/2019   Localized, primary osteoarthritis of hand 02/03/2021   Mass 05/04/2017   Nasal sore 12/18/2019   Nuclear sclerotic cataract of left eye 04/21/2016   Other chronic pain 03/12/2020   Other paralytic strabismus, 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   Overweight 02/03/2021   Pain in joint of right hip 09/22/2018   Pain in limb 02/03/2021   Pain in right knee 02/03/2021   Palpitations 06/11/2015   Presence of right artificial knee joint 03/02/2016   Primary osteoarthritis 02/03/2021   Pulmonary emboli ([place] 05/24/2019     CTa 02/23/19 pos for PE 02/23/19  Venous dopplers not done, echo ok    RA (rheumatoid arthritis) ([place]    Rheumatoid arthritis ([place] 02/03/2021   S/P right unicompartmental knee replacement    06-20-2015  post dislocating plastic    Seronegative rheumatoid arthritis ([place] 12/18/2019   Status post right unicompartmental knee replacement 03/03/2016   SVT (supraventricular tachycardia)    Transient neurological  symptoms 03/12/2020   Urticaria 02/03/2021   Vitamin D deficiency 02/03/2021    Allergies  Allergen Reactions   Dilaudid [Hydromorphone] Shortness Of Breath and Other (See Comments)    Chest tight   Morphine Shortness Of Breath    SEVERE   Plaquenil [Hydroxychloroquine] Itching, Swelling and Rash   Infliximab Rash and Other (See Comments)    (REMICADE)SEVERE   Ciprofloxacin Other (See Comments)    Thoracic aortic aneurysm    No current facility-administered medications on file prior to encounter.   Current Outpatient Medications on File Prior to Encounter  Medication Sig Dispense Refill   albuterol (VENTOLIN HFA) 108 (90 Base)  MCG/ACT inhaler INHALE 2 PUFFS INTO THE LUNGS EVERY 6 (SIX) HOURS AS NEEDED FOR WHEEZING OR SHORTNESS OF BREATH. 18 g 5   aspirin EC 81 MG tablet Take 1 tablet (81 mg total) by mouth daily. Swallow whole. 90 tablet 3   atorvastatin (LIPITOR) 10 MG tablet TAKE 1 TABLET DAILY 90 tablet 3   baclofen (LIORESAL) 10 MG tablet Take 10 mg by mouth 3 (three) times daily as needed for muscle spasms.     DULoxetine (CYMBALTA) 60 MG capsule TAKE 1 CAPSULE DAILY 90 capsule 2   famotidine (PEPCID) 20 MG tablet Take 1 tablet (20 mg total) by mouth daily. After supper (Patient taking differently: Take 20 mg by mouth in the morning.) 90 tablet 3   flecainide (TAMBOCOR) 50 MG tablet Take 1 tablet (50 mg total) by mouth 2 (two) times daily. 180 tablet 1   fluorouracil (EFUDEX) 5 % cream Apply 1 Application topically in the morning and at bedtime.     Glucosamine-Chondroit-Vit C-Mn (GLUCOSAMINE 1500 COMPLEX) CAPS Take 1 capsule by mouth in the morning.     hydrochlorothiazide (MICROZIDE) 12.5 MG capsule Take 1 capsule (12.5 mg total) by mouth daily. 90 capsule 3   HYDROcodone-acetaminophen (NORCO/VICODIN) 5-325 MG tablet Take 1 tablet by mouth 3 (three) times daily as needed (pain).     ibuprofen (ADVIL) 200 MG tablet Take 800 mg by mouth every 8 (eight) hours as needed (pain.).     leflunomide (ARAVA) 20 MG tablet Take 20 mg by mouth in the morning.     loratadine-pseudoephedrine (CLARITIN-D 24-HOUR) 10-240 MG 24 hr tablet Take 1 tablet by mouth in the morning.     predniSONE (DELTASONE) 5 MG tablet Take 5 mg by mouth daily with breakfast.     RESTASIS 0.05 % ophthalmic emulsion Place 1 drop into both eyes 2 (two) times daily as needed (dry/irritated eyes.).     temazepam (RESTORIL) 15 MG capsule TAKE 1 CAPSULE AT BEDTIME  AS NEEDED FOR SLEEP (Patient taking differently: Take 15 mg by mouth at bedtime.) 90 capsule 0   valsartan (DIOVAN) 80 MG tablet Take 1 tablet (80 mg total) by mouth 2 (two) times daily. 570 tablet  3   Certolizumab Pegol (CIMZIA STARTER KIT [place]) Inject 2 Syringes into the skin every 28 (twenty-eight) days.      Physical Exam: Vitals:   06/25/22 0547  BP: (!) 166/92  Pulse: 78  Resp: 18  Temp: 98.4 F (36.9 C)  SpO2: 96%   Body mass index is 26.11 kg/m. Clinical exam: Timmothy Sours is a pleasant individual, who appears younger than their stated age.  He is alert and orientated 3.  No shortness of breath, chest pain.  Abdomen is soft and non-tender, negative loss of bowel and bladder control, no rebound tenderness.  Negative: skin lesions abrasions contusions  Peripheral pulses:  2+ dorsalis pedis/posterior tibialis pulses bilaterally. LE compartments are: Soft and nontender.  Gait pattern: Altered gait pattern due to progressive back buttock and radicular right leg pain.  Assistive devices: Walker  Neuro: Positive numbness and dysesthesias in the L4 and 5 dermatome on the right side. Patient notes generalized weakness due to significant pain in the right lower extremity. Trace weakness of his EHL and tibialis anterior on the right. No radicular left leg pain. Negative Babinski test, no clonus, negative nerve root tension signs.  Musculoskeletal: Severe low back pain with extension, rotation, and deep palpation. No SI joint pain.  Imaging: X-rays of the lumbar spine demonstrate an 18 degree concave to the left degenerative scoliosis of the lumbar spine L2-4. Multilevel degenerative disc disease in the lumbar spine no acute fracture. Significant collapse of the disks L2-S1 AP view of the lumbar spine with a cephalad tilt demonstrates significant collapse of the L4-5 disc as well as a enlarged left transverse process of L5.  X-rays of the right knee demonstrate no fracture or progression in arthritis. Status post right unicompartmental knee replacement.  Lumbar MRI: completed on 06/03/2022. Severe disc space narrowing L2-S1. Small disc protrusion at L1/2 that does not contact the  neural elements. L2-3: Modic endplate changes are noted with left posterior lateral disc protrusion reducing severe left neuroforaminal stenosis and lateral recess stenosis. No significant compression on the right side. L3-4: Left lateral recess and foraminal stenosis is noted moderate in severity. No significant right-sided neural compression. L4-5 there is severe right facet arthropathy causing marked foraminal and lateral recess stenosis acting the traversing L5 and exiting L4 nerve root. There is also a facet cyst further compromising the central canal volume. There is moderate to severe facet arthropathy at L5-S1 with moderate right foraminal stenosis.   A/P: Summary: Luis Wilkinson returns today for follow-up with his wife. On exam he still notes his primary issue is his severe radicular right leg pain. He has had back pain for many years and has been able to live with it but he states currently new onset of radicular right leg pain has been unbearable. Despite multiple epidural injections and facet blocks his quality of life has not improved. In addition the physical therapy has not been beneficial.   I have gone over the MRI and images with the patient and his wife in great detail. I believe his back pain is multifactorial. The degenerative scoliosis, multilevel degenerative disc disease, and spinal stenosis will all contribute to his severe back pain. In order to address this he would need a very extensive anterior posterior spinal fusion. Alternatively, I believe the primary source of the debilitating right radicular leg pain is the L4-5 stenosis. In order to address this he would need a Gill decompression on the right side which would result in potential instability. As result he would also require a fusion following the decompression. This could result in increased back pain due to worsening of his curvature and the need for additional surgery. After much deliberation and discussing the pros and cons of  an extensive spinal surgery versus the TLIF at L4-5 he is elected to move forward with a TLIF at L4-5 which I think is his best option.   I have gone over risks and benefits of the spinal fusion as well as alternatives and all of their questions were addressed. Risks and benefits of spinal fusion: Infection, bleeding, death, stroke, paralysis, ongoing or worse pain, need for additional surgery, nonunion, leak of spinal fluid, adjacent  segment degeneration requiring additional fusion surgery, Injury to abdominal vessels that can require anterior surgery to stop bleeding. Malposition of the cage and/or pedicle screws that could require additional surgery. Loss of bowel and bladder control. Postoperative hematoma causing neurologic compression that could require urgent or emergent re-operation.  Planned surgical procedure: TLIF L4-5. We will use NuVasive expandable intervertebral cage along with cortical pedicle screws. We used autograft from the decompression along with DBX mix for additional allograft.

## 2022-06-25 NOTE — Brief Op Note (Signed)
06/25/2022  11:56 AM  PATIENT:  Luis Wilkinson  66 y.o. male  PRE-OPERATIVE DIAGNOSIS:  Degenerative scoliosis with right leg radiculopathy and L4-5 facet cyst and spinal stenosis  POST-OPERATIVE DIAGNOSIS:  Degenerative scoliosis with right leg radiculopathy and L4-5 facet cyst and spinal stenosis  PROCEDURE:  Procedure(s) with comments: TRANSFORAMINAL LUMBAR INTERBODY FUSION LUMBAR FOUR TO FIVE (N/A) - 4 hrs 3 C-Bed  SURGEON:  Surgeon(s) and Role:    Melina Schools, MD - Primary  PHYSICIAN ASSISTANT:   ASSISTANTS: none   ANESTHESIA:   general  EBL:  125 mL   BLOOD ADMINISTERED:none  DRAINS: none   LOCAL MEDICATIONS USED:  MARCAINE     SPECIMEN:  No Specimen  DISPOSITION OF SPECIMEN:  N/A  COUNTS:  YES  TOURNIQUET:  * No tourniquets in log *  DICTATION: .Dragon Dictation  PLAN OF CARE: Admit to inpatient   PATIENT DISPOSITION:  PACU - hemodynamically stable.

## 2022-06-25 NOTE — Anesthesia Postprocedure Evaluation (Signed)
Anesthesia Post Note  Patient: Luis Wilkinson  Procedure(s) Performed: TRANSFORAMINAL LUMBAR INTERBODY FUSION LUMBAR FOUR TO FIVE (Spine Lumbar)     Patient location during evaluation: PACU Anesthesia Type: General Level of consciousness: awake and alert Pain management: pain level controlled Vital Signs Assessment: post-procedure vital signs reviewed and stable Respiratory status: spontaneous breathing, nonlabored ventilation, respiratory function stable and patient connected to nasal cannula oxygen Cardiovascular status: blood pressure returned to baseline and stable Postop Assessment: no apparent nausea or vomiting Anesthetic complications: no   No notable events documented.  Last Vitals:  Vitals:   06/25/22 1300 06/25/22 1332  BP: (!) 159/96 (!) 159/93  Pulse: 93 84  Resp: 17 18  Temp:    SpO2: 95% 98%    Last Pain:  Vitals:   06/25/22 1245  TempSrc:   PainSc: [place]

## 2022-06-25 NOTE — Op Note (Signed)
OPERATIVE REPORT  DATE OF SURGERY: 06/25/2022  PATIENT NAME:  Luis Wilkinson MRN: [id number] DOB: July 02, 1956  PCP: Biagio Borg, MD  PRE-OPERATIVE DIAGNOSIS: Degenerative lumbar disc disease with degenerative scoliosis and severe foraminal stenosis at L4-5.  Positive right lower extremity radiculopathy  POST-OPERATIVE DIAGNOSIS: Same  PROCEDURE:   TLIF L4-5  SURGEON:  Melina Schools, MD  PHYSICIAN ASSISTANT: None  ANESTHESIA:   General  EBL: 244 ml   Complications: None  Implants: NuVasive expandable TLIF cage (Mod EX PL) 7 x 9 x 28 mm: Expanded to anterior height of 9.6 mm and posterior height of 9 mm.  NuVasive cortical pedicle screws: 5.5 x 40 mm length L4, and 6.5 x 40 mm length at L5.  Graft: Autograft with supplemental DBX  Neuromonitoring: No adverse free running EMG or SSEP activity.  All 4 pedicle screws were directly stimulated.  Right side: L4 was positive at 30 mA and L5 to 35 mA.  Left side: L5 4 was positive at greater than 40 mA, and L5 at 20 mA.  BRIEF HISTORY: Luis Wilkinson is a 66 y.o. male who presented to my office with severe chronic back pain and acute onset of debilitating radicular right leg pain.  Imaging demonstrated multilevel degenerative disc disease with degenerative scoliosis and advanced right facet arthropathy causing marked lateral recess and foraminal stenosis.  Unfortunately conservative measures did not improve his quality of life and so we elected to move forward with surgery.  All appropriate risks, benefits, and alternatives to surgery were discussed with the patient and consent was obtained.  PROCEDURE DETAILS: Patient was brought into the operating room and was properly positioned on the operating room table.  After induction with general anesthesia the patient was endotracheally intubated.  A timeout was taken to confirm all important data: including patient, procedure, and the level. Teds, SCD's were applied.   Foley was placed by the  nurse, and the neuro monitoring representative placed all appropriate needles.  Patient was turned prone onto the [facility] spine frame.  All bony prominences well-padded and the back was prepped and draped in a standard fashion.  Using fluoroscopy I marked out the superior portion of the L4 pedicle and the inferior portion of the L5 pedicle.  I then drew my incision site and infiltrated with quarter percent Marcaine with epinephrine.  Midline incision was made and sharp dissection was carried out down to the deep fascia.  The fascia was sharply incised and I stripped the paraspinal muscles to expose the L4 and L5 spinous processes as well as the L3-4 and L4-5 facet complexes bilaterally.  Once I had the appropriate exposure I then proceeded with the screw fixation.  High-speed bur was placed on the 7 o'clock position of the facet complex at L3-4 confirming satisfactory position on the L4 pedicle I broached the cortex and then placed the pedicle awl.  I advanced the pedicle all starting at the 7 o'clock position on the right pedicle and aimed for the 1 o'clock position.  As a advanced once I was beyond the midpoint of the pedicle on AP view I confirmed I was at the satisfactory depth on the lateral.  I advanced into the vertebral body and then removed the pedicle I then probed the hole with a ball-tipped feeler to confirm that there was no breach.  I then tapped and then repalpated the hole.  I then placed the appropriate sized pedicle screw which had excellent fixation.  I then went to the contralateral side  and using the same technique I placed a cortical pedicle screw.  The only difference was that I started at the 5 o'clock position and aiming towards the 11 o'clock position.  Once the L4 pedicle screws were properly positioned I then exposed the L4 4 5 facet complex and identified the appropriate starting position with AP fluoroscopy.  I breached the cortex and then advanced the pedicle all using fluoroscopy  guidance.  Once I firmed satisfactory depth on fluoroscopy I removed the all and then palpated the hole with a ball-tipped feeler.  I then tapped and palpated and then placed the screw.  Using the same technique I placed the screw on the contralateral side.  At this point with all 4 pedicle screws in place I then directly stimulated.  There was activity ranging from 20 to greater than 40 mA.  With satisfactory positioning using fluoroscopy in both the AP and lateral planes and based on neuro monitoring I proceeded with the decompression.  Remove the entire facet capsule bilaterally at the L4-5 facet joint in order to facilitate the fusion.  I then removed the majority of the L4 spinous process using a double-action Leksell rongeur as well as the L4 lamina.  All of this bone was kept for the arthrodesis.  I then used an osteotome to resect the inferior L4 facet complex in its entirety.  Then resected the significant osteophyte that had developed along the superior aspect of the L5 facet.  I then identified the lateral border of the pars and began resecting the pars in its entirety.  I proceeded medially.  At this point I then dissected through the ligamentum flavum and created a plane and resected the ligamentum flavum with the Kerrison rongeurs.  There was significant adhesion to the remaining portion of the medial aspect of the facet complex to the thecal sac.  This correlated with the area of maximum compression of the lateral recess.  I gently dissected through this area into the lateral recess.  Great care was taken not to pierce the thecal sac and create a durotomy.  At this point I was able to completely resect the medial aspect of the facet in order to decompress the lateral recess.  The L4 nerve root was now visible in the foramen and I remove the remaining overhanging bone and soft tissue to adequately decompress it.  There were very large epidural veins which came into view and these were identified and  isolated and coagulated with bipolar electrocautery.  I then continued inferiorly until I was able to palpate the medial border of the L5 pedicle.  I then continued to resect the bone to completely decompress the lateral recess.  I then identified the L5 nerve root and traced into the foramen and performed a generous foraminotomy.  The leading edge of the L5 lamina was also trimmed down in order to adequately decompress the thecal sac, the L5, and L4 nerve roots.  I then dissected to expose the posterior annulus.  The nerve root was protected and I incised the annulus with a 15 blade scalpel.  Using pituitary rongeurs, curettes and Kerrison rongeurs I resected all of the disc material.  Sidecutting curettes were used to ensure that we were able to reach the contralateral side and remove the disc material.  Once the discectomy was complete I irrigated the wound copiously normal saline.  Trials were placed and then the final implant was obtained.  The exiting L4 nerve root was identified and protected as  was the thecal sac.  The implant was then inserted and malleted to the appropriate depth.  I confirmed satisfactory position in both the AP and lateral planes.  I also confirmed with direct visualization of the posterior aspect of the cage was countersunk within the disc space and was not prominent or contacting the neural elements.  I then expanded the anterior and posterior aspects of the cage.  Once I had adequate fit I then backfilled the cage with DBX mix.  Additional autograft bone was then placed lateral to the cage in order to facilitate fusion.  I then exposed the left L4-5 facet and using a high-speed bur decorticated this area.  I also decorticated a portion of the L4 transverse process.  I then packed the posterior lateral gutter and the L4-5 facet complex with the remaining autograft bone.  Polyaxial heads were then applied to the screws and the appropriate size rod was affixed into the polyaxial  heads and locked in place with a locking caps.  All locking caps were then torqued and tightened according manufacture standards.  The remaining portion of DBX mix was then used over the autograft in the left posterior lateral gutter.  The wound on the right side was then copes irrigated with normal saline and I confirmed hemostasis.  I was able to easily palpate using my [facility] elevator superiorly above the L4 pedicle confirming satisfactory decompression.  I was able to directly visualize the L4 nerve root in the foramen and it was no longer under compression.  There was still some adhesive scarlike material contacting the thecal sac.  I did debride this as best I could without risking a durotomy.  I was able to easily pass my nerve hook underneath the thecal sac and along the L5 nerve root into the foramen.  The entire area was adequately decompressed there was no residual compression.  Once I confirmed hemostasis after final irrigation a thrombin-soaked Gelfoam patty was placed over the exposed laminotomy site and nerve root.  The polyaxial heads were then attached and the appropriate rod was secured into place.  The locking caps were attached and torqued according manufacture standards.  At this point the final irrigation I removed the retractors and make sure that hemostasis.  Final x-rays were taken which demonstrated satisfactory position of the intervertebral cage and the posterior pedicle screw construct.  The deep fascia was then closed with interrupted #1 Vicryl suture, then superficial with 2-0 Vicryl suture and finally 3-0 Monocryl for the skin.  Steri-Strips and dry dressing were applied and the patient was extubated and transferred the PACU without incident.  The end of the case all needle and sponge counts were correct.  There were no adverse intraoperative events.  Melina Schools, MD 06/25/2022 11:40 AM

## 2022-06-25 NOTE — Anesthesia Procedure Notes (Signed)
Procedure Name: Intubation Date/Time: 06/25/2022 7:41 AM  Performed by: Maude Leriche, CRNAPre-anesthesia Checklist: Patient identified, Emergency Drugs available, Suction available and Patient being monitored Patient Re-evaluated:Patient Re-evaluated prior to induction Oxygen Delivery Method: Circle system utilized Preoxygenation: Pre-oxygenation with 100% oxygen Induction Type: IV induction Ventilation: Mask ventilation without difficulty Laryngoscope Size: Miller and 2 Grade View: Grade II Tube type: Oral Tube size: 7.5 mm Number of attempts: 1 Airway Equipment and Method: Stylet and Bite block Placement Confirmation: ETT inserted through vocal cords under direct vision, positive ETCO2 and breath sounds checked- equal and bilateral Secured at: 23 cm Tube secured with: Tape Dental Injury: Teeth and Oropharynx as per pre-operative assessment

## 2022-06-25 NOTE — Discharge Instructions (Signed)

## 2022-06-26 ENCOUNTER — Encounter (HOSPITAL_COMMUNITY): Payer: Self-pay | Admitting: Orthopedic Surgery

## 2022-06-26 MED ORDER — METOPROLOL SUCCINATE ER 100 MG PO TB24
100.0000 mg | ORAL_TABLET | Freq: Every day | ORAL | Status: DC
  Administered 2022-06-26: 100 mg via ORAL
  Filled 2022-06-26: qty 1

## 2022-06-26 MED ORDER — HYDROCHLOROTHIAZIDE 12.5 MG PO TABS
12.5000 mg | ORAL_TABLET | Freq: Every day | ORAL | Status: DC
  Administered 2022-06-26: 12.5 mg via ORAL
  Filled 2022-06-26: qty 1

## 2022-06-26 NOTE — Evaluation (Signed)
Physical Therapy Evaluation Patient Details Name: Luis Wilkinson MRN: [id number] DOB: August 22, 1955 Today's Date: 06/26/2022  History of Present Illness  66 y.o. male s/p TLIF L4-5 on 06/25/22. PMH: anemia,AAA, HTN, GERD, hx of PE, RA, SVT.  Clinical Impression  Patient admitted following above procedure. Patient will have wife to assist at discharge. Educated patient on back precautions, brace wear, activity progression, and car transfers, patient verbalized understanding. Patient functioning at modI level with no AD. Able to negotiate stairs with B rails to safely access home. No further skilled PT needs identified acutely. No PT follow up recommended at this time.      Recommendations for follow up therapy are one component of a multi-disciplinary discharge planning process, led by the attending physician.  Recommendations may be updated based on patient status, additional functional criteria and insurance authorization.  Follow Up Recommendations No PT follow up      Assistance Recommended at Discharge PRN  Patient can return home with the following       Equipment Recommendations None recommended by PT  Recommendations for Other Services       Functional Status Assessment Patient has had a recent decline in their functional status and demonstrates the ability to make significant improvements in function in a reasonable and predictable amount of time.     Precautions / Restrictions Precautions Precautions: Back Precaution Booklet Issued: Yes (comment) Required Braces or Orthoses: Spinal Brace Spinal Brace: Lumbar corset Restrictions Weight Bearing Restrictions: No      Mobility  Bed Mobility               General bed mobility comments: up with OT on arrival    Transfers Overall transfer level: Modified independent Equipment used: None                    Ambulation/Gait Ambulation/Gait assistance: Modified independent (Device/Increase time) Gait  Distance (Feet): 300 Feet Assistive device: None Gait Pattern/deviations: WFL(Within Functional Limits) Gait velocity: decreased        Stairs Stairs: Yes Stairs assistance: Modified independent (Device/Increase time) Stair Management: Two rails, Alternating pattern, Forwards Number of Stairs: 10    Wheelchair Mobility    Modified Rankin (Stroke Patients Only)       Balance Overall balance assessment: No apparent balance deficits (not formally assessed)                                           Pertinent Vitals/Pain Pain Assessment Pain Assessment: Faces Faces Pain Scale: Hurts a little bit Pain Location: incision site Pain Descriptors / Indicators: Discomfort Pain Intervention(s): Monitored during session    Home Living Family/patient expects to be discharged to:: Private residence Living Arrangements: Spouse/significant other Available Help at Discharge: Family Type of Home: House Home Access: Stairs to enter Entrance Stairs-Rails: Psychiatric nurse of Steps: 7     Home Equipment: None      Prior Function Prior Level of Function : Independent/Modified Independent                     Hand Dominance        Extremity/Trunk Assessment   Upper Extremity Assessment Upper Extremity Assessment: Defer to OT evaluation    Lower Extremity Assessment Lower Extremity Assessment: Overall WFL for tasks assessed    Cervical / Trunk Assessment Cervical / Trunk Assessment: Back Surgery  Communication  Communication: No difficulties  Cognition Arousal/Alertness: Awake/alert Behavior During Therapy: WFL for tasks assessed/performed Overall Cognitive Status: Within Functional Limits for tasks assessed                                          General Comments      Exercises     Assessment/Plan    PT Assessment Patient does not need any further PT services  PT Problem List         PT  Treatment Interventions      PT Goals (Current goals can be found in the Care Plan section)  Acute Rehab PT Goals Patient Stated Goal: to go home PT Goal Formulation: All assessment and education complete, DC therapy    Frequency       Co-evaluation               AM-PAC PT "6 Clicks" Mobility  Outcome Measure Help needed turning from your back to your side while in a flat bed without using bedrails?: None Help needed moving from lying on your back to sitting on the side of a flat bed without using bedrails?: None Help needed moving to and from a bed to a chair (including a wheelchair)?: None Help needed standing up from a chair using your arms (e.g., wheelchair or bedside chair)?: None Help needed to walk in hospital room?: None Help needed climbing 3-5 steps with a railing? : None 6 Click Score: 24    End of Session Equipment Utilized During Treatment: Back brace Activity Tolerance: Patient tolerated treatment well Patient left: in bed;with call bell/phone within reach Nurse Communication: Mobility status PT Visit Diagnosis: Muscle weakness (generalized) (M62.81)    Time: 2426-8341 PT Time Calculation (min) (ACUTE ONLY): 15 min   Charges:   PT Evaluation $PT Eval Low Complexity: 1 Low          Gaudencio Chesnut A. Gilford Rile PT, DPT Acute Rehabilitation Services Office [phone]   Linna Hoff 06/26/2022, 9:16 AM

## 2022-06-26 NOTE — Plan of Care (Signed)
  Problem: Skin Integrity: Goal: Risk for impaired skin integrity will decrease Outcome: Completed/Met   Problem: Education: Goal: Ability to verbalize activity precautions or restrictions will improve Outcome: Completed/Met Goal: Knowledge of the prescribed therapeutic regimen will improve Outcome: Completed/Met Goal: Understanding of discharge needs will improve Outcome: Completed/Met   Problem: Activity: Goal: Ability to avoid complications of mobility impairment will improve Outcome: Completed/Met Goal: Ability to tolerate increased activity will improve Outcome: Completed/Met Goal: Will remain free from falls Outcome: Completed/Met   Problem: Bowel/Gastric: Goal: Gastrointestinal status for postoperative course will improve Outcome: Completed/Met   Problem: Clinical Measurements: Goal: Ability to maintain clinical measurements within normal limits will improve Outcome: Completed/Met Goal: Postoperative complications will be avoided or minimized Outcome: Completed/Met Goal: Diagnostic test results will improve Outcome: Completed/Met   Problem: Pain Management: Goal: Pain level will decrease Outcome: Completed/Met   Problem: Skin Integrity: Goal: Will show signs of wound healing Outcome: Completed/Met   Problem: Health Behavior/Discharge Planning: Goal: Identification of resources available to assist in meeting health care needs will improve Outcome: Completed/Met   Problem: Bladder/Genitourinary: Goal: Urinary functional status for postoperative course will improve Outcome: Completed/Met   

## 2022-06-26 NOTE — Evaluation (Signed)
Occupational Therapy Evaluation Patient Details Name: Luis Wilkinson MRN: [id number] DOB: 1956-01-14 Today's Date: 06/26/2022   History of Present Illness 66 y.o. male s/p TLIF L4-5 on 06/25/22. PMH: anemia,AAA, HTN, GERD, hx of PE, RA, SVT.   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 Upon eval, pt performing all ADL with modified independence within precautions. Educated and demonstrating use of compensatory techniques for bed mobility, LB dressing, grooming, toileting, shower transfers. Recommend discharge home with no OT follow up at this time. OT to sign off. Re-consult if change in status. Thank you for this order.      Recommendations for follow up therapy are one component of a multi-disciplinary discharge planning process, led by the attending physician.  Recommendations may be updated based on patient status, additional functional criteria and insurance authorization.   Follow Up Recommendations  No OT follow up     Assistance Recommended at Discharge PRN  Patient can return home with the following Assist for transportation    Functional Status Assessment  Patient has had a recent decline in their functional status and demonstrates the ability to make significant improvements in function in a reasonable and predictable amount of time.  Equipment Recommendations  None recommended by OT    Recommendations for Other Services       Precautions / Restrictions Precautions Precautions: Back Precaution Booklet Issued: Yes (comment) Precaution Comments: all education reviewed in context of ADL Required Braces or Orthoses: Spinal Brace Spinal Brace: Lumbar corset Restrictions Weight Bearing Restrictions: No      Mobility Bed Mobility Overal bed mobility: Needs Assistance Bed Mobility: Rolling, Sidelying to Sit Rolling: Modified independent (Device/Increase  time) Sidelying to sit: Supervision       General bed mobility comments: Supervision and 1-2 cues for technique    Transfers Overall transfer level: Modified independent Equipment used: None                      Balance Overall balance assessment: No apparent balance deficits (not formally assessed)                                         ADL either performed or assessed with clinical judgement   ADL Overall ADL's : Modified independent                                       General ADL Comments: All education reviewed and pt performing within precautions     Vision Baseline Vision/History: 0 No visual deficits Ability to See in Adequate Light: 0 Adequate Patient Visual Report: No change from baseline Vision Assessment?: No apparent visual deficits     Perception Perception Perception Tested?: No   Praxis Praxis Praxis tested?: Not tested    Pertinent Vitals/Pain Pain Assessment Pain Assessment: Faces Faces Pain Scale: Hurts a little bit Pain Location: incision site Pain Descriptors / Indicators: Discomfort Pain Intervention(s): Limited activity within patient's tolerance, Monitored during session     Hand Dominance     Extremity/Trunk Assessment Upper Extremity Assessment Upper Extremity Assessment: Overall WFL for tasks assessed   Lower Extremity Assessment Lower Extremity Assessment: Overall WFL for tasks assessed   Cervical / Trunk Assessment  Cervical / Trunk Assessment: Back Surgery   Communication Communication Communication: No difficulties   Cognition Arousal/Alertness: Awake/alert Behavior During Therapy: WFL for tasks assessed/performed Overall Cognitive Status: Within Functional Limits for tasks assessed                                 General Comments: Able to recall 3/3 precautions     General Comments  VSS    Exercises     Shoulder Instructions      Home Living  Family/patient expects to be discharged to:: Private residence Living Arrangements: Spouse/significant other Available Help at Discharge: Family Type of Home: House Home Access: Stairs to enter Technical brewer of Steps: 7 Entrance Stairs-Rails: Right;Left Home Layout: One level     Bathroom Shower/Tub: Occupational psychologist: [place]: None          Prior Functioning/Environment Prior Level of Function : Independent/Modified Independent                        OT Problem List: Decreased strength;Decreased activity tolerance;Pain      OT Treatment/Interventions:      OT Goals(Current goals can be found in the care plan section) Acute Rehab OT Goals Patient Stated Goal: go home OT Goal Formulation: With patient  OT Frequency:      Co-evaluation              AM-PAC OT "6 Clicks" Daily Activity     Outcome Measure Help from another person eating meals?: None Help from another person taking care of personal grooming?: None Help from another person toileting, which includes using toliet, bedpan, or urinal?: None Help from another person bathing (including washing, rinsing, drying)?: None Help from another person to put on and taking off regular upper body clothing?: None Help from another person to put on and taking off regular lower body clothing?: None 6 Click Score: 24   End of Session Equipment Utilized During Treatment: Back brace Nurse Communication: Mobility status  Activity Tolerance: Patient tolerated treatment well Patient left: Other (comment) (In hall- handoff to PT)  OT Visit Diagnosis: Muscle weakness (generalized) (M62.81);Pain Pain - part of body:  (back)                Time: 6256-3893 OT Time Calculation (min): 16 min Charges:  OT General Charges $OT Visit: 1 Visit OT Evaluation $OT Eval Low Complexity: 1 Low  Shanda Howells, OTR/L [facility] Acute Rehabilitation Office: [phone]   Magnus Ivan 06/26/2022, 10:06 AM

## 2022-06-26 NOTE — Progress Notes (Signed)
Surgical site clean and dry, patient alert and oriented, void,  pass gas,VSS. D/c instructions explain and copy given to the patient. All questions answered. Pt. D/c home per order.

## 2022-06-26 NOTE — Discharge Summary (Signed)
Patient ID: Luis Wilkinson MRN: [id number] DOB/AGE: [birth year] 65 y.o.  Admit date: 06/25/2022 Discharge date: 06/26/2022  Admission Diagnoses:  Principal Problem:   S/P lumbar fusion   Discharge Diagnoses:  Principal Problem:   S/P lumbar fusion  status post Procedure(s): TRANSFORAMINAL LUMBAR INTERBODY FUSION LUMBAR FOUR TO FIVE  Past Medical History:  Diagnosis Date   Acetabular labrum tear 10/10/2019   Acute sinusitis 02/22/2020   Allergic rhinitis 12/18/2019   Anemia 07/03/2020   Aneurysm, ascending aorta ([place])    followed br Dr Hortencia Pilar   Ascending aortic aneurysm ([place]) 06/11/2015   Body mass index (BMI) 27.0-27.9, adult 03/11/2020   Cancer ([place])    Basal cell removed from top of left hand and 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 Etiology with no venous dopplers  - 03/16/2019   Walked RA  2 laps @  approx 253f each @ nl pace  stopped due to  End of study with some sob and chest tightness with sats 98%   - trial off acei and max rx for gerd 03/16/2019  - Echo 03/21/2019   No PH  - 03/30/2019   Walked    Droopy eyelid, left 12/18/2019   Dysphagia 01/02/2021   Eosinophilia 03/31/2019   Onset of asthma as child / [facility] eval around 2015    Essential (primary) hypertension 06/11/2015   Change from ACE inhibitor to ARB 03/16/2019 due to unexplained dry cough and chest tightness > completely resolved as of 05/23/2019    GERD (gastroesophageal reflux disease)    Ground glass opacity present on imaging of lung 01/02/2021   Headache    history of migraines, but haven't had one in several years   History of hiatal hernia    History of kidney stones    History of partial  colectomy 12/18/2019   History of prosthetic unicompartmental arthroplasty of right knee 03/02/2016   Hyperglycemia 07/04/2020   Hypertension    Insomnia 12/18/2019   Localized, primary osteoarthritis of hand 02/03/2021   Mass 05/04/2017   Nasal sore 12/18/2019   Nuclear sclerotic cataract of left eye 04/21/2016   Other chronic pain 03/12/2020   Other paralytic strabismus, 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   Overweight 02/03/2021   Pain in joint of right hip 09/22/2018   Pain in limb 02/03/2021   Pain in right knee 02/03/2021   Palpitations 06/11/2015   Presence of right artificial knee joint 03/02/2016   Primary osteoarthritis 02/03/2021   Pulmonary emboli ([place] 05/24/2019     CTa 02/23/19 pos for PE 02/23/19  Venous dopplers not done, echo ok    [place] ([place]umatoid arthritis) [place]    Rheumatoid arthritis ([place] 02/03/2021   S/P right unicompartmental knee replacement    06-20-2015  post dislocating plastic    Seronegative rheumatoid arthritis ([place] 12/18/2019   Status post right unicompartmental knee replacement 03/03/2016   SVT (supraventricular tachycardia)    Transient neurological symptoms 03/12/2020   Urticaria 02/03/2021   Vitamin D deficiency 02/03/2021    Surgeries: Procedure(s): TRANSFORAMINAL LUMBAR INTERBODY FUSION LUMBAR FOUR TO FIVE on 06/25/2022   Consultants:   Discharged Condition: Improved  Hospital Course: DCayman Wilkinson an 66y.o.  male who was admitted 06/25/2022 for operative treatment of S/P lumbar fusion. Patient failed conservative treatments (please see the history and physical for the specifics) and had severe unremitting pain that affects sleep, daily activities and work/hobbies. After pre-op clearance, the patient was taken to the operating room on 06/25/2022 and underwent  Procedure(s): TRANSFORAMINAL LUMBAR INTERBODY FUSION LUMBAR FOUR TO FIVE.    Patient was given perioperative antibiotics:  Anti-infectives (From admission, onward)    Start     Dose/Rate  Route Frequency Ordered Stop   06/25/22 1600  ceFAZolin (ANCEF) IVPB 1 g/50 mL premix        1 g 100 mL/hr over 30 Minutes Intravenous Every 8 hours 06/25/22 1328 06/26/22 0113   06/25/22 0555  ceFAZolin (ANCEF) IVPB 2g/100 mL premix        2 g 200 mL/hr over 30 Minutes Intravenous 30 min pre-op 06/25/22 0555 06/25/22 0743        Patient was given sequential compression devices and early ambulation to prevent DVT.   Patient benefited maximally from hospital stay and there were no complications. At the time of discharge, the patient was urinating/moving their bowels without difficulty, tolerating a regular diet, pain is controlled with oral pain medications and they have been cleared by PT/OT.   Recent vital signs: Patient Vitals for the past 24 hrs:  BP Temp Temp src Pulse Resp SpO2  06/26/22 1119 (!) 158/92 98.5 F (36.9 C) Oral 68 18 95 %  06/26/22 0756 (!) 132/91 98.5 F (36.9 C) Oral 74 18 98 %  06/26/22 0434 (!) 174/103 98.4 F (36.9 C) Oral 72 18 99 %  06/26/22 0147 (!) 154/101 -- -- 81 -- --  06/26/22 0123 (!) 192/112 98.8 F (37.1 C) Oral 79 20 99 %  06/25/22 2007 (!) 159/101 98.4 F (36.9 C) Oral 88 20 96 %  06/25/22 1711 (!) 148/97 98.1 F (36.7 C) Oral 90 18 98 %  06/25/22 1332 (!) 159/93 -- -- 84 18 98 %  06/25/22 1300 (!) 159/96 -- -- 93 17 95 %  06/25/22 1245 (!) 158/101 -- -- 92 10 90 %  06/25/22 1230 131/85 -- -- 92 13 97 %     Recent laboratory studies: No results for input(s): "WBC", "HGB", "HCT", "PLT", "NA", "K", "CL", "CO2", "BUN", "CREATININE", "GLUCOSE", "INR", "CALCIUM" in the last 72 hours.  Invalid input(s): "PT", "2"   Discharge Medications:   Allergies as of 06/26/2022       Reactions   Dilaudid [hydromorphone] Shortness Of Breath, Other (See Comments)   Chest tight   Morphine Shortness Of Breath   SEVERE   Plaquenil [hydroxychloroquine] Itching, Swelling, Rash   Infliximab Rash, Other (See Comments)   (REMICADE)SEVERE   Ciprofloxacin  Other (See Comments)   Thoracic aortic aneurysm        Medication List     STOP taking these medications    aspirin EC 81 MG tablet   baclofen 10 MG tablet Commonly known as: LIORESAL   CIMZIA STARTER KIT [place]   fluorouracil 5 % cream Commonly known as: EFUDEX   HYDROcodone-acetaminophen 5-325 MG tablet Commonly known as: NORCO/VICODIN   ibuprofen 200 MG tablet Commonly known as: ADVIL   predniSONE 5 MG tablet Commonly known as: DELTASONE       TAKE these medications    albuterol 108 (90 Base) MCG/ACT inhaler Commonly known as: VENTOLIN HFA INHALE 2 PUFFS INTO THE LUNGS EVERY 6 (SIX) HOURS AS NEEDED FOR WHEEZING OR SHORTNESS OF  BREATH.   atorvastatin 10 MG tablet Commonly known as: LIPITOR TAKE 1 TABLET DAILY   DULoxetine 60 MG capsule Commonly known as: CYMBALTA TAKE 1 CAPSULE DAILY   famotidine 20 MG tablet Commonly known as: PEPCID Take 1 tablet (20 mg total) by mouth daily. After supper What changed:  when to take this additional instructions   flecainide 50 MG tablet Commonly known as: TAMBOCOR Take 1 tablet (50 mg total) by mouth 2 (two) times daily.   Glucosamine 1500 Complex Caps Take 1 capsule by mouth in the morning.   hydrochlorothiazide 12.5 MG capsule Commonly known as: MICROZIDE Take 1 capsule (12.5 mg total) by mouth daily.   leflunomide 20 MG tablet Commonly known as: ARAVA Take 20 mg by mouth in the morning.   loratadine-pseudoephedrine 10-240 MG 24 hr tablet Commonly known as: CLARITIN-D 24-hour Take 1 tablet by mouth in the morning.   methocarbamol 500 MG tablet Commonly known as: ROBAXIN Take 1 tablet (500 mg total) by mouth every 8 (eight) hours as needed for up to 5 days for muscle spasms.   metoprolol succinate 100 MG 24 hr tablet Commonly known as: TOPROL-XL TAKE 1 TABLET DAILY WITH ORIMMEDIATELY FOLLOWING A    MEAL ( DOSE INCREASE )   ondansetron 4 MG tablet Commonly known as: Zofran Take 1 tablet (4 mg total)  by mouth every 8 (eight) hours as needed for nausea or vomiting.   oxyCODONE-acetaminophen 10-325 MG tablet Commonly known as: Percocet Take 1 tablet by mouth every 6 (six) hours as needed for up to 5 days for pain.   Restasis 0.05 % ophthalmic emulsion Generic drug: cycloSPORINE Place 1 drop into both eyes 2 (two) times daily as needed (dry/irritated eyes.).   temazepam 15 MG capsule Commonly known as: RESTORIL TAKE 1 CAPSULE AT BEDTIME  AS NEEDED FOR SLEEP What changed: See the new instructions.   valsartan 80 MG tablet Commonly known as: Diovan Take 1 tablet (80 mg total) by mouth 2 (two) times daily.        Diagnostic Studies: DG Lumbar Spine 2-3 Views  Result Date: 06/25/2022 CLINICAL DATA:  Lumbar surgery. EXAM: LUMBAR SPINE - 2-3 VIEW COMPARISON:  Lumbar spine MRI 03/09/2020 FLUOROSCOPY: Radiation Exposure Index (as provided by the fluoroscopic device): 140.18 mGy Kerma FINDINGS: Three intraoperative spot fluoroscopic images are provided. Fusion has been performed at L4-5 with placement of an interbody spacer and bilateral pedicle screws and interconnecting rods. IMPRESSION: Intraoperative images during L4-5 fusion. Electronically Signed   By: Logan Bores M.D.   On: 06/25/2022 11:30   DG C-Arm 1-60 Min-No Report  Result Date: 06/25/2022 Fluoroscopy was utilized by the requesting physician.  No radiographic interpretation.   DG C-Arm 1-60 Min-No Report  Result Date: 06/25/2022 Fluoroscopy was utilized by the requesting physician.  No radiographic interpretation.   DG C-Arm 1-60 Min-No Report  Result Date: 06/25/2022 Fluoroscopy was utilized by the requesting physician.  No radiographic interpretation.   DG C-Arm 1-60 Min-No Report  Result Date: 06/25/2022 Fluoroscopy was utilized by the requesting physician.  No radiographic interpretation.    Discharge Instructions     Incentive spirometry RT   Complete by: As directed         Follow-up Information      Melina Schools, MD. Schedule an appointment as soon as possible for a visit in 2 week(s).   Specialty: Orthopedic Surgery Why: If symptoms worsen, For wound re-check, For suture removal Contact information: [address] [place] [address]  [phone]                 Discharge Plan:  discharge to home  Disposition: Patient is doing exceptionally well status post L4-5 TLIF.  He notes complete resolution of his radicular right leg pain.  He has been ambulating the halls without assistance.  His only complaint is incisional back pain which is well controlled with the oral medications.  He is voiding spontaneously and has had positive flatus.  He is neurologically intact with no focal deficits, compartments in the lower extremity are soft and nontender, and he has intact peripheral pulses bilaterally.  At the time of discharge he was given appropriate instructions and medications were called into his pharmacy.  These include Percocet, Robaxin, and Zofran.  Patient was given appropriate instructions and he will follow-up with me in 2 weeks for wound check.    Signed: Dahlia Bailiff for Dr. Melina Schools [facility] [phone] 06/26/2022, 12:19 PM

## 2022-06-30 ENCOUNTER — Encounter: Payer: Self-pay | Admitting: *Deleted

## 2022-06-30 ENCOUNTER — Telehealth: Payer: Self-pay | Admitting: *Deleted

## 2022-06-30 NOTE — Patient Outreach (Signed)
  Care Coordination [facility] Note Transition Care Management Unsuccessful Follow-up Telephone Call  Date of discharge and from where:  Friday, 06/26/22- [place]; lumbar 4-5 fusion  Attempts:  1st Attempt  Reason for unsuccessful TCM follow-up call:  Left voice message  Oneta Rack, RN, BSN, CCRN Alumnus RN CM Care Coordination/ Transition of [place] Management [phone]: direct office

## 2022-07-01 ENCOUNTER — Telehealth: Payer: Self-pay | Admitting: *Deleted

## 2022-07-01 ENCOUNTER — Encounter: Payer: Self-pay | Admitting: *Deleted

## 2022-07-01 NOTE — Patient Outreach (Signed)
  Care Coordination [facility] Note Transition Care Management Follow-up Telephone Call Date of discharge and from where: Friday, 06/26/22 [facility]; lumbar 4-5 fusion How have you been since you were released from the hospital? "I am doing really great-- feeling better every day; the pain I had before the surgery is completely gone, now I am just dealing with post-op pain and soreness, but Dr. Rolena Infante told me to expect that.  My wife is keeping a close eye out on me- she is here helping me get better and looking at the incision to make sure it looks good.  I am able to do everything for myself to take care of myself, right now, the only thing I need help with is the driving because I have not yet been released to resume driving.  I never have taken any vaccines, it is just my personal preference.  Will you please send me a message on MyChart with the numbers for the CEO and the Patient Experience Department?  I want to share my feedback about my hospital visit with them." Any questions or concerns? No  Items Reviewed: Did the pt receive and understand the discharge instructions provided? Yes  Medications obtained and verified? Yes  Other? No  Any new allergies since your discharge? No  Dietary orders reviewed? Yes Do you have support at home? Yes  wife assisting with care needs as indicated/ needed  Home Care and Equipment/Supplies: Were home health services ordered? no If so, what is the name of the agency? N/A  Has the agency set up a time to come to the patient's home? not applicable Were any new equipment or medical supplies ordered?  No What is the name of the medical supply agency? N/A Were you able to get the supplies/equipment? No N/A Do you have any questions related to the use of the equipment or supplies? No N/A  Functional Questionnaire: (I = Independent and D = Dependent) ADLs: I  wife assisting with care needs as indicated  Bathing/Dressing- I  Meal Prep- I  wife assisting with  care needs as indicated  Eating- I  Maintaining continence- I  Transferring/Ambulation- I  Managing Meds- I  Follow up appointments reviewed:  PCP Hospital f/u appt confirmed? No  Scheduled to see - on - @ [facility] f/u appt confirmed? Yes  Scheduled to see Dr. Rolena Infante, neurosurgery on Friday 07/10/22 @ 2:20 pm Are transportation arrangements needed? No  If their condition worsens, is the pt aware to call PCP or go to the Emergency Dept.? Yes Was the patient provided with contact information for the PCP's office or ED? No- reports has phone numbers for all care providers Was to pt encouraged to call back with questions or concerns? Yes  SDOH assessments and interventions completed:   Yes  Care Coordination Interventions Activated:  Yes   Care Coordination Interventions:  Provided education around use of post-op pain medication, signs/ symptoms incisional infection along with corresponding action plan; post-op activity limitations/ need to stay active but not over-do activity; discussed need for flu vaccine with patient/ basic infection control/ prevention in light of his decision to not obtain flu vaccine; provided numbers as requested through [place] for patient to share his feedback around hospital visit      Encounter Outcome:  Pt. Visit Completed    Oneta Rack, RN, BSN, CCRN Alumnus RN CM Care Coordination/ Transition of [place] Management [phone]: direct office

## 2022-07-01 NOTE — Patient Instructions (Signed)
Visit Information  Thank you for taking time to visit with me today.  It was a pleasure talking with you today and I hope you continue to recuperate well after your recent surgery. Please don't hesitate to contact me if I can be of assistance to you.   As you requested, the number to the Patient Experience Department is: [phone] The e-mail address for the [facility] is:  [email or web address]  Oneta Rack, RN, BSN, CCRN Alumnus RN CM Care Coordination/ Transition of Care- [place] Management [phone]: direct office   If you are experiencing a Mental Health or [place] or need someone to talk to, please  call the Suicide and Crisis Lifeline: 988 call the [place] National Suicide Prevention Lifeline: (713)617-0007 or TTY: 435-877-0559 TTY 603-616-1553) to talk to a trained counselor call 1-800-273-TALK (toll free, 24 hour hotline) go to [facility] [address] [place] [phone]) call the [place]: [phone] call 911   Patient verbalizes understanding of instructions and care plan provided today and agrees to view in [place]. Active MyChart status and patient understanding of how to access instructions and care plan via MyChart confirmed with patient.     No further follow up required: no further or ongoing care coordination needs identified today

## 2022-07-14 ENCOUNTER — Other Ambulatory Visit (HOSPITAL_COMMUNITY): Payer: Medicare Other

## 2022-07-20 ENCOUNTER — Other Ambulatory Visit: Payer: Self-pay | Admitting: Internal Medicine

## 2022-07-27 ENCOUNTER — Other Ambulatory Visit: Payer: Self-pay | Admitting: Internal Medicine

## 2022-08-12 ENCOUNTER — Telehealth: Payer: Self-pay | Admitting: Internal Medicine

## 2022-08-12 NOTE — Telephone Encounter (Signed)
Left message for patient to call back to schedule Medicare Annual Wellness Visit   Last AWV  08/12/21  Please schedule at anytime with [facility] [place] if patient calls the office back.     Any questions, please call me at [phone]

## 2022-08-25 ENCOUNTER — Encounter: Payer: Medicare Other | Admitting: Internal Medicine

## 2022-08-28 ENCOUNTER — Ambulatory Visit: Payer: Medicare Other | Attending: Cardiology | Admitting: Cardiology

## 2022-08-28 ENCOUNTER — Encounter: Payer: Self-pay | Admitting: Cardiology

## 2022-08-28 VITALS — BP 130/90 | HR 64 | Ht 70.0 in | Wt 190.6 lb

## 2022-08-28 DIAGNOSIS — I10 Essential (primary) hypertension: Secondary | ICD-10-CM | POA: Insufficient documentation

## 2022-08-28 DIAGNOSIS — I7121 Aneurysm of the ascending aorta, without rupture: Secondary | ICD-10-CM | POA: Insufficient documentation

## 2022-08-28 DIAGNOSIS — I471 Supraventricular tachycardia, unspecified: Secondary | ICD-10-CM | POA: Diagnosis not present

## 2022-08-28 DIAGNOSIS — R0609 Other forms of dyspnea: Secondary | ICD-10-CM | POA: Insufficient documentation

## 2022-08-28 NOTE — Progress Notes (Signed)
Cardiology Office Note:    Date:  08/29/2022   ID:  Luis Wilkinson, DOB [birth year], MRN [id number]  PCP:  Biagio Borg, MD  Cardiologist:  Berniece Salines, DO  Electrophysiologist:  None   Referring MD: Biagio Borg, MD   " I am having elevated blood pressure home"  History of Present Illness:    Luis Wilkinson is a 67 y.o. male with a hx of right and left heart catheterization) with no evidence of coronary artery disease and normal right-sided pressures, ascending aortic aneurysm 43 mm on imaging done on January 16, 2021 reported to be unchanged from his previous study which was done in December 2021     I did see the patient back in December for a operative clearance for hand surgery.  We will repeat an echocardiogram and a CTA given his history of ascending thoracic aneurysm.  I also referred the patient to vascular surgery.  He was intermittently cleared for his hand surgery.  He was able to get his hand surgery.  He also has seen vascular surgeon.   I saw the patient in March 2022 at that time he appeared to be doing well from a cardiovascular standpoint no changes were made to his medication regimen.  He did have some shortness of breath for recommended patient get a PFT to make sure secondary lung pathology was not playing a role as his echocardiogram was normal.   He was seen on January 30, 2021 giving the worsening of symptoms are very out of proportion with his shortness of breath I recommended patient undergo left and right heart catheterization.  At that time I also place a ZIO monitor on the patient.   At his visit on March 18, 2021 his flecainide had been increased prior to that visit by EP.  He was doing well.  I saw the patient on 09/18/2021 at that time he was status post colonoscopy. His blood pressure was elevated at that time therefore I restart his HCTz. He was tolerating other medication.   Today he is concerned because the shortness of breath which has been an ongoing problem  has worsened. He is limited. He previously saw Pulm and was told that his symptoms are not explained by any pulmonary conditions. He is understandably frustrated.    Past Medical History:  Diagnosis Date   Acetabular labrum tear 10/10/2019   Acute sinusitis 02/22/2020   Allergic rhinitis 12/18/2019   Anemia 07/03/2020   Aneurysm, ascending aorta (HCC)    followed br Dr Hortencia Pilar   Ascending aortic aneurysm ([place]) 06/11/2015   Body mass index (BMI) 27.0-27.9, adult 03/11/2020   Cancer ([place])    Basal cell removed from top of left hand and squamous cell will be removed from top of left hand in January of 2024   Chest pain of uncertain etiology 51/76/1607   Chronic low back pain 02/22/2020   Colonic mass 10/26/2019   DOE (dyspnea on exertion) 03/16/2019   Onset early June 2020 with assoc chest tightness  - CTa 02/23/19 several segmental and subsegmental PE with micronodular lung dz ? Etiology with no venous dopplers  - 03/16/2019   Walked RA  2 laps @  approx 247f each @ nl pace  stopped due to  End of study with some sob and chest tightness with sats 98%   - trial off acei and max rx for gerd 03/16/2019  - Echo 03/21/2019   No PH  - 03/30/2019   Walked  Droopy eyelid, left 12/18/2019   Dysphagia 01/02/2021   Eosinophilia 03/31/2019   Onset of asthma as child / [facility] eval around 2015    Essential (primary) hypertension 06/11/2015   Change from ACE inhibitor to ARB 03/16/2019 due to unexplained dry cough and chest tightness > completely resolved as of 05/23/2019    GERD (gastroesophageal reflux disease)    Ground glass opacity present on imaging of lung 01/02/2021   Headache    history of migraines, but haven't had one in several years   History of hiatal hernia    History of kidney stones    History of partial colectomy 12/18/2019   History of prosthetic unicompartmental arthroplasty of right knee 03/02/2016   Hyperglycemia 07/04/2020   Hypertension    Insomnia 12/18/2019   Localized,  primary osteoarthritis of hand 02/03/2021   Mass 05/04/2017   Nasal sore 12/18/2019   Nuclear sclerotic cataract of left eye 04/21/2016   Other chronic pain 03/12/2020   Other paralytic strabismus, 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   Overweight 02/03/2021   Pain in joint of right hip 09/22/2018   Pain in limb 02/03/2021   Pain in right knee 02/03/2021   Palpitations 06/11/2015   Presence of right artificial knee joint 03/02/2016   Primary osteoarthritis 02/03/2021   Pulmonary emboli ([place]) 05/24/2019     CTa 02/23/19 pos for PE 02/23/19  Venous dopplers not done, echo ok    RA (rheumatoid arthritis) ([place])    Rheumatoid arthritis ([place]) 02/03/2021   S/P right unicompartmental knee replacement    06-20-2015  post dislocating plastic    Seronegative rheumatoid arthritis ([place]) 12/18/2019   Status post right unicompartmental knee replacement 03/03/2016   SVT (supraventricular tachycardia)    Transient neurological symptoms 03/12/2020   Urticaria 02/03/2021   Vitamin D deficiency 02/03/2021    Past Surgical History:  Procedure Laterality Date   CARDIAC CATHETERIZATION  Feb 2015    [place]   per pt normal coronary arteries   CATARACT EXTRACTION W/ INTRAOCULAR LENS IMPLANT Bilateral 10/08/2012   CYSTOSCOPY W/ URETEROSCOPY W/ LITHOTRIPSY     EXCISION SUBDERMAL NECK TUMOR  08/10/2008   benign   KNEE ARTHROSCOPY W/ MENISCECTOMY Right 01/09/2015   LEFT ELBOW RECONSTRUCTION  08/10/2008   PARTIAL COLECTOMY  2019   PARTIAL KNEE ARTHROPLASTY Right 08/08/2015   Procedure: RIGHT UNICOMPARTMENTAL KNEE REVISION PLASTIC;  Surgeon: Paralee Cancel, MD;  Location: [facility];  Service: Orthopedics;  Laterality: Right;   PARTIAL KNEE ARTHROPLASTY Right 03/02/2016   Procedure: UNICOMPARTMENTAL RIGHT KNEE REVISION;  Surgeon: Paralee Cancel, MD;  Location: [facility];  Service: Orthopedics;  Laterality: Right;   REPLACEMENT UNICONDYLAR JOINT KNEE Right 06/20/2015   RIGHT/LEFT HEART CATH AND CORONARY  ANGIOGRAPHY N/A 01/30/2021   Procedure: RIGHT/LEFT HEART CATH AND CORONARY ANGIOGRAPHY;  Surgeon: Belva Crome, MD;  Location: [facility];  Service: Cardiovascular;  Laterality: N/A;   STRABISMUS SURGERY Left x6   last one --Age 62   TONSILLECTOMY     removed as a child   TRANSFORAMINAL LUMBAR INTERBODY FUSION (TLIF) WITH PEDICLE SCREW FIXATION 1 LEVEL N/A 06/25/2022   Procedure: TRANSFORAMINAL LUMBAR INTERBODY FUSION LUMBAR FOUR TO FIVE;  Surgeon: Melina Schools, MD;  Location: [facility];  Service: Orthopedics;  Laterality: N/A;  4 hrs 3 C-Bed    Current Medications: Current Meds  Medication Sig   albuterol (VENTOLIN HFA) 108 (90 Base) MCG/ACT inhaler INHALE 2 PUFFS INTO THE LUNGS EVERY 6 (SIX) HOURS AS NEEDED FOR WHEEZING OR  SHORTNESS OF BREATH.   atorvastatin (LIPITOR) 10 MG tablet TAKE 1 TABLET DAILY   DULoxetine (CYMBALTA) 60 MG capsule TAKE 1 CAPSULE DAILY   famotidine (PEPCID) 20 MG tablet Take 1 tablet (20 mg total) by mouth daily. After supper (Patient taking differently: Take 20 mg by mouth in the morning.)   flecainide (TAMBOCOR) 50 MG tablet Take 1 tablet (50 mg total) by mouth 2 (two) times daily.   Glucosamine-Chondroit-Vit C-Mn (GLUCOSAMINE 1500 COMPLEX) CAPS Take 1 capsule by mouth in the morning.   hydrochlorothiazide (MICROZIDE) 12.5 MG capsule Take 1 capsule (12.5 mg total) by mouth daily.   leflunomide (ARAVA) 20 MG tablet Take 20 mg by mouth in the morning.   loratadine-pseudoephedrine (CLARITIN-D 24-HOUR) 10-240 MG 24 hr tablet Take 1 tablet by mouth in the morning.   metoprolol succinate (TOPROL-XL) 100 MG 24 hr tablet TAKE 1 TABLET DAILY WITH ORIMMEDIATELY FOLLOWING A    MEAL ( DOSE INCREASE )   ondansetron (ZOFRAN) 4 MG tablet Take 1 tablet (4 mg total) by mouth every 8 (eight) hours as needed for nausea or vomiting.   RESTASIS 0.05 % ophthalmic emulsion Place 1 drop into both eyes 2 (two) times daily as needed (dry/irritated eyes.).   temazepam (RESTORIL) 15  MG capsule TAKE 1 CAPSULE AT BEDTIME  AS NEEDED FOR SLEEP   valsartan (DIOVAN) 80 MG tablet Take 1 tablet (80 mg total) by mouth 2 (two) times daily.     Allergies:   Dilaudid [hydromorphone], Morphine, Plaquenil [hydroxychloroquine], Infliximab, and Ciprofloxacin   Social History   Socioeconomic History   Marital status: Married    Spouse name: Not on file   Number of children: 2   Years of education: Not on file   Highest education level: Not on file  Occupational History   Not on file  Tobacco Use   Smoking status: Former    Packs/day: 0.50    Years: 10.00    Total pack years: 5.00    Types: Cigarettes    Start date: 7    Quit date: 07/19/1994    Years since quitting: 28.1   Smokeless tobacco: Never  Vaping Use   Vaping Use: Never used  Substance and Sexual Activity   Alcohol use: No   Drug use: No   Sexual activity: Not on file  Other Topics Concern   Not on file  Social History Narrative   Not on file   Social Determinants of Health   Financial Resource Strain: Low Risk  (08/12/2021)   Overall Financial Resource Strain (CARDIA)    Difficulty of Paying Living Expenses: Not hard at all  Food Insecurity: No Food Insecurity (07/01/2022)   Hunger Vital Sign    Worried About Running Out of Food in the Last Year: Never true    [place] in the Last Year: Never true  Transportation Needs: No Transportation Needs (07/01/2022)   PRAPARE - Hydrologist (Medical): No    Lack of Transportation (Non-Medical): No  Physical Activity: Inactive (08/12/2021)   Exercise Vital Sign    Days of Exercise per Week: 0 days    Minutes of Exercise per Session: 0 min  Stress: No Stress Concern Present (08/12/2021)   [place]    Feeling of Stress : Not at all  Social Connections: [place] (08/12/2021)   Social Connection and Isolation Panel [NHANES]    Frequency of  Communication with Friends and  Family: More than three times a week    Frequency of Social Gatherings with Friends and Family: More than three times a week    Attends Religious Services: More than 4 times per year    Active Member of [employer] or Organizations: Yes    Attends Music therapist: More than 4 times per year    Marital Status: Married     Family History: The patient's family history includes Congestive Heart Failure in his father and mother.  ROS:   Review of Systems  Constitution: Negative for decreased appetite, fever and weight gain.  HENT: Negative for congestion, ear discharge, hoarse voice and sore throat.   Eyes: Negative for discharge, redness, vision loss in right eye and visual halos.  Cardiovascular: Negative for chest pain, dyspnea on exertion, leg swelling, orthopnea and palpitations.  Respiratory: Negative for cough, hemoptysis, shortness of breath and snoring.   Endocrine: Negative for heat intolerance and polyphagia.  Hematologic/Lymphatic: Negative for bleeding problem. Does not bruise/bleed easily.  Skin: Negative for flushing, nail changes, rash and suspicious lesions.  Musculoskeletal: Negative for arthritis, joint pain, muscle cramps, myalgias, neck pain and stiffness.  Gastrointestinal: Negative for abdominal pain, bowel incontinence, diarrhea and excessive appetite.  Genitourinary: Negative for decreased libido, genital sores and incomplete emptying.  Neurological: Negative for brief paralysis, focal weakness, headaches and loss of balance.  Psychiatric/Behavioral: Negative for altered mental status, depression and suicidal ideas.  Allergic/Immunologic: Negative for HIV exposure and persistent infections.    EKGs/Labs/Other Studies Reviewed:    The following studies were reviewed today:   EKG:  None today  [facility] 01/30/2021 Right dominant normal coronary arteries. Normal left ventricular systolic function with EF greater than 55%.  LVEDP  14 mmHg. Normal right heart pressures with mean wedge pressure 11 mmHg.   RECOMMENDATIONS:   Exertional dyspnea does not appear to be related to pulmonary hypertension from prior pulmonary emboli, systolic or diastolic heart failure, and/or coronary artery disease with myocardial ischemia. Consider cardiopulmonary function testing. 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  TTE 01/22/2021 IMPRESSIONS   1. Left ventricular ejection fraction, by estimation, is 60 to 65%. The  left ventricle has normal function. The left ventricle has no regional  wall motion abnormalities. Left ventricular diastolic parameters are  indeterminate.   2. Right ventricular systolic function is normal. The right ventricular  size is normal. There is normal pulmonary artery systolic pressure.   3. The mitral valve is normal in structure. No evidence of mitral valve  regurgitation. No evidence of mitral stenosis.   4. The aortic valve is normal in structure. Aortic valve regurgitation is  mild. No aortic stenosis is present.   5. Aneurysm of the ascending aorta, measuring 44 mm.   6. The inferior vena cava is normal in size with greater than 50%  respiratory variability, suggesting right atrial pressure of 3 mmHg.   FINDINGS   Left Ventricle: Left ventricular ejection fraction, by estimation, is 60  to 65%. The left ventricle has normal function. The left ventricle has no  regional wall motion abnormalities. The left ventricular internal cavity  size was normal in size. There is   borderline left ventricular hypertrophy. Left ventricular diastolic  parameters are indeterminate.   Right Ventricle: The right ventricular size is normal. No increase in  right ventricular wall thickness. Right ventricular systolic function is  normal. There is normal pulmonary artery systolic pressure. The tricuspid  regurgitant velocity is 2.02 m/s, and   with an assumed right atrial pressure  of 3 mmHg, the estimated right   ventricular systolic pressure is 42.3 mmHg.   Left Atrium: Left atrial size was normal in size.   Right Atrium: Right atrial size was normal in size.   Pericardium: There is no evidence of pericardial effusion.   Mitral Valve: The mitral valve is normal in structure. No evidence of  mitral valve regurgitation. No evidence of mitral valve stenosis.   Tricuspid Valve: The tricuspid valve is normal in structure. Tricuspid  valve regurgitation is not demonstrated. No evidence of tricuspid  stenosis.   Aortic Valve: The aortic valve is normal in structure. Aortic valve  regurgitation is mild. No aortic stenosis is present.   Pulmonic Valve: The pulmonic valve was normal in structure. Pulmonic valve  regurgitation is not visualized. No evidence of pulmonic stenosis.   Aorta: The aortic root is normal in size and structure. There is an  aneurysm involving the ascending aorta measuring 44 mm.   Venous: The inferior vena cava is normal in size with greater than 50%  respiratory variability, suggesting right atrial pressure of 3 mmHg.   IAS/Shunts: No atrial level shunt detected by color flow Doppler.       Recent Labs: 06/22/2022: BUN 19; Creatinine, Ser 1.26; Hemoglobin 12.9; Platelets 259; Potassium 3.4; Sodium 138  Recent Lipid Panel    Component Value Date/Time   CHOL 272 (H) 08/18/2021 1641   TRIG (H) 08/18/2021 1641    723.0 Triglyceride is over 400; calculations on Lipids are invalid.   HDL 47.30 08/18/2021 1641   CHOLHDL 6 08/18/2021 1641   VLDL 34.0 01/02/2021 1019   LDLCALC 83 01/02/2021 1019   LDLDIRECT 160.0 08/18/2021 1641    Physical Exam:    VS:  BP (!) 130/90   Pulse 64   Ht '5\' 10"'$  (1.778 m)   Wt 86.5 kg   SpO2 97%   BMI 27.35 kg/m     Wt Readings from Last 3 Encounters:  08/28/22 86.5 kg  06/25/22 82.6 kg  06/22/22 86.1 kg     GEN: Well nourished, well developed in no acute distress HEENT: Normal NECK: No JVD; No carotid bruits LYMPHATICS: No  lymphadenopathy CARDIAC: S1S2 noted,RRR, no murmurs, rubs, gallops RESPIRATORY:  Clear to auscultation without rales, wheezing or rhonchi  ABDOMEN: Soft, non-tender, non-distended, +bowel sounds, no guarding. EXTREMITIES: No edema, No cyanosis, no clubbing MUSCULOSKELETAL:  No deformity  SKIN: Warm and dry NEUROLOGIC:  Alert and oriented x 3, non-focal PSYCHIATRIC:  Normal affect, good insight  ASSESSMENT:    1. Essential (primary) hypertension   2. PSVT (paroxysmal supraventricular tachycardia)   3. DOE (dyspnea on exertion)   4. Aneurysm of ascending aorta without rupture (HCC)     PLAN:    He continues to be dyspneic on exertion and this is affecting his is affecting his quality of life. He has had normal Right and left heart cath, normal PFT, TTE in 2022 was normal yet symptoms on exertion persist now worsened. I would like to pursue diagnositic testing in the patient to get a better understanding.  Stress echo was postponed due to his back surgery.  He has now had his back surgery and is recovered.  Plan for his stress echo to look for any dynamic outflow tract gradient that maybe leading to his symptoms as he only experiences the sob on exertion.  For now will continue his current medication regimen.  Spoke with the patient if his stress echo is negative it will be beneficial to  speak with his rheumatologist to consider changing out his leflunomide as this medication can likely cause shortness of breath.  He tells me that his blood pressure fluctuates at home clinical okay to have the patient take his blood pressure daily and send me that information in 2 weeks.  After my review we will likely adjust his antihypertensive medications.  Blood pressure is acceptable.   Review imaging CTA chest aortic dilatation is stable.    Medication Adjustments/Labs and Tests Ordered: Current medicines are reviewed at length with the patient today.  Concerns regarding medicines are outlined  above.  No orders of the defined types were placed in this encounter.  No orders of the defined types were placed in this encounter.   Patient Instructions  Medication Instructions:  Your physician recommends that you continue on your current medications as directed. Please refer to the Current Medication list given to you today.   Please take your blood pressure daily for 2 weeks and send in a MyChart message. Please include heart rates.   HOW TO TAKE YOUR BLOOD PRESSURE: Rest 5 minutes before taking your blood pressure. Don't smoke or drink caffeinated beverages for at least 30 minutes before. Take your blood pressure before (not after) you eat. Sit comfortably with your back supported and both feet on the floor (don't cross your legs). Elevate your arm to heart level on a table or a desk. Use the proper sized cuff. It should fit smoothly and snugly around your bare upper arm. There should be enough room to slip a fingertip under the cuff. The bottom edge of the cuff should be 1 inch above the crease of the elbow. Ideally, take 3 measurements at one sitting and record the average.  *If you need a refill on your cardiac medications before your next appointment, please call your pharmacy*   Lab Work: None   Testing/Procedures: None   Follow-Up: At [facility], you and your health needs are our priority.  As part of our continuing mission to provide you with exceptional heart care, we have created designated Provider Care Teams.  These Care Teams include your primary Cardiologist (physician) and Advanced Practice Providers (APPs -  Physician Assistants and Nurse Practitioners) who all work together to provide you with the care you need, when you need it.  We recommend signing up for the patient portal called "MyChart".  Sign up information is provided on this After Visit Summary.  MyChart is used to connect with patients for Virtual Visits (Telemedicine).  Patients are  able to view lab/test results, encounter notes, upcoming appointments, etc.  Non-urgent messages can be sent to your provider as well.   To learn more about what you can do with MyChart, go to [email or web address].    Your next appointment:   4 month(s)  Provider:   Berniece Salines, DO     Other Instructions     Adopting a Healthy Lifestyle.  Know what a healthy weight is for you (roughly BMI <25) and aim to maintain this   Aim for 7+ servings of fruits and vegetables daily   65-80+ fluid ounces of water or unsweet tea for healthy kidneys   Limit to max 1 drink of alcohol per day; avoid smoking/tobacco   Limit animal fats in diet for cholesterol and heart health - choose grass fed whenever available   Avoid highly processed foods, and foods high in saturated/trans fats   Aim for low stress - take time to unwind and care for  your mental health   Aim for 150 min of moderate intensity exercise weekly for heart health, and weights twice weekly for bone health   Aim for 7-9 hours of sleep daily   When it comes to diets, agreement about the perfect plan isnt easy to find, even among the experts. Experts at the [employer] developed an idea known as the Healthy Eating Plate. Just imagine a plate divided into logical, healthy portions.   The emphasis is on diet quality:   Load up on vegetables and fruits - one-half of your plate: Aim for color and variety, and remember that potatoes dont count.   Go for whole grains - one-quarter of your plate: Whole wheat, barley, wheat berries, quinoa, oats, brown rice, and foods made with them. If you want pasta, go with whole wheat pasta.   Protein power - one-quarter of your plate: Fish, chicken, beans, and nuts are all healthy, versatile protein sources. Limit red meat.   The diet, however, does go beyond the plate, offering a few other suggestions.   Use healthy plant oils, such as olive, canola, soy, corn, sunflower  and peanut. Check the labels, and avoid partially hydrogenated oil, which have unhealthy trans fats.   If youre thirsty, drink water. Coffee and tea are good in moderation, but skip sugary drinks and limit milk and dairy products to one or two daily servings.   The type of carbohydrate in the diet is more important than the amount. Some sources of carbohydrates, such as vegetables, fruits, whole grains, and beans-are healthier than others.   Finally, stay active  Signed, Berniece Salines, DO  08/29/2022 10:06 PM    [facility]

## 2022-08-28 NOTE — Patient Instructions (Signed)
Medication Instructions:  Your physician recommends that you continue on your current medications as directed. Please refer to the Current Medication list given to you today.   Please take your blood pressure daily for 2 weeks and send in a MyChart message. Please include heart rates.   HOW TO TAKE YOUR BLOOD PRESSURE: Rest 5 minutes before taking your blood pressure. Don't smoke or drink caffeinated beverages for at least 30 minutes before. Take your blood pressure before (not after) you eat. Sit comfortably with your back supported and both feet on the floor (don't cross your legs). Elevate your arm to heart level on a table or a desk. Use the proper sized cuff. It should fit smoothly and snugly around your bare upper arm. There should be enough room to slip a fingertip under the cuff. The bottom edge of the cuff should be 1 inch above the crease of the elbow. Ideally, take 3 measurements at one sitting and record the average.  *If you need a refill on your cardiac medications before your next appointment, please call your pharmacy*   Lab Work: None   Testing/Procedures: None   Follow-Up: At [facility], you and your health needs are our priority.  As part of our continuing mission to provide you with exceptional heart care, we have created designated Provider Care Teams.  These Care Teams include your primary Cardiologist (physician) and Advanced Practice Providers (APPs -  Physician Assistants and Nurse Practitioners) who all work together to provide you with the care you need, when you need it.  We recommend signing up for the patient portal called "MyChart".  Sign up information is provided on this After Visit Summary.  MyChart is used to connect with patients for Virtual Visits (Telemedicine).  Patients are able to view lab/test results, encounter notes, upcoming appointments, etc.  Non-urgent messages can be sent to your provider as well.   To learn more about what you  can do with MyChart, go to [email or web address].    Your next appointment:   4 month(s)  Provider:   Berniece Salines, DO     Other Instructions

## 2022-08-30 ENCOUNTER — Encounter: Payer: Self-pay | Admitting: Cardiology

## 2022-08-31 MED ORDER — VALSARTAN 160 MG PO TABS: 160.0000 mg | ORAL_TABLET | Freq: Two times a day (BID) | ORAL | 3 refills | Status: DC

## 2022-08-31 NOTE — Telephone Encounter (Signed)
Spoke with patient and informed him to increase valsartan to '160mg'$  twice daily and to continue monitoring BP/P as he has been. He repeated instructions back to me in his own words. Order placed with [facility] (normal).

## 2022-09-01 ENCOUNTER — Encounter: Payer: Self-pay | Admitting: Cardiology

## 2022-09-04 ENCOUNTER — Telehealth: Payer: Self-pay

## 2022-09-04 ENCOUNTER — Ambulatory Visit (INDEPENDENT_AMBULATORY_CARE_PROVIDER_SITE_OTHER): Payer: Medicare Other

## 2022-09-04 VITALS — Ht 70.0 in | Wt 190.0 lb

## 2022-09-04 DIAGNOSIS — Z0000 Encounter for general adult medical examination without abnormal findings: Secondary | ICD-10-CM | POA: Diagnosis not present

## 2022-09-04 MED ORDER — FAMOTIDINE 20 MG PO TABS: 20.0000 mg | ORAL_TABLET | Freq: Every morning | ORAL | 3 refills | Status: DC

## 2022-09-04 NOTE — Telephone Encounter (Signed)
Patient is requesting a refill on famotidine to be sent to [facility].  Please contact patient.

## 2022-09-04 NOTE — Telephone Encounter (Signed)
Ok this is done 

## 2022-09-04 NOTE — Progress Notes (Signed)
Virtual Visit via Telephone Note  I connected with  Luis Wilkinson on 09/04/22 at  3:00 PM EST by telephone and verified that I am speaking with the correct person using two identifiers.  Location: Patient: Home Provider: [place] Persons participating in the virtual visit: Luis Wilkinson   I discussed the limitations, risks, security and privacy concerns of performing an evaluation and management service by telephone and the availability of in person appointments. The patient expressed understanding and agreed to proceed.  Interactive audio and video telecommunications were attempted between this nurse and patient, however failed, due to patient having technical difficulties OR patient did not have access to video capability.  We continued and completed visit with audio only.  Some vital signs may be absent or patient reported.   [name], LPN  Subjective:   Luis Wilkinson is a 67 y.o. male who presents for Medicare Annual/Subsequent preventive examination.  Review of Systems     Cardiac Risk Factors include: advanced age (>48mn, >>27women);dyslipidemia;hypertension;male gender;sedentary lifestyle;family history of premature cardiovascular disease     Objective:    Today's Vitals   09/04/22 1504  Weight: 190 lb (86.2 kg)  Height: '5\' 10"'$  (1.778 m)  PainSc: 0-No pain   Body mass index is 27.26 kg/m.     09/04/2022    3:15 PM 06/22/2022    1:25 PM 08/12/2021   10:05 AM 01/30/2021    6:10 AM 01/12/2019    7:54 AM 01/06/2019    9:35 AM 03/03/2016    2:00 AM  Advanced Directives  Does Patient Have a Medical Advance Directive? Yes Yes Yes Yes No No No  Type of [place]of [place] will Living will;Healthcare Power of [place] will     Does patient want to make changes to medical advance directive? No - Patient declined No - Patient declined  No - Patient declined No - Patient declined     Copy of [place] Chart? Yes - validated most recent copy scanned in chart (See row information) Yes - validated most recent copy scanned in chart (See row information) No - copy requested No - copy requested     Would patient like information on creating a medical advance directive?      No - Guardian declined     Current Medications (verified) Outpatient Encounter Medications as of 09/04/2022  Medication Sig   albuterol (VENTOLIN HFA) 108 (90 Base) MCG/ACT inhaler INHALE 2 PUFFS INTO THE LUNGS EVERY 6 (SIX) HOURS AS NEEDED FOR WHEEZING OR SHORTNESS OF BREATH.   atorvastatin (LIPITOR) 10 MG tablet TAKE 1 TABLET DAILY   DULoxetine (CYMBALTA) 60 MG capsule TAKE 1 CAPSULE DAILY   famotidine (PEPCID) 20 MG tablet Take 1 tablet (20 mg total) by mouth daily. After supper (Patient taking differently: Take 20 mg by mouth in the morning.)   flecainide (TAMBOCOR) 50 MG tablet Take 1 tablet (50 mg total) by mouth 2 (two) times daily.   Glucosamine-Chondroit-Vit C-Mn (GLUCOSAMINE 1500 COMPLEX) CAPS Take 1 capsule by mouth in the morning.   hydrochlorothiazide (MICROZIDE) 12.5 MG capsule Take 1 capsule (12.5 mg total) by mouth daily.   leflunomide (ARAVA) 20 MG tablet Take 20 mg by mouth in the morning.   loratadine-pseudoephedrine (CLARITIN-D 24-HOUR) 10-240 MG 24 hr tablet Take 1 tablet by mouth in the morning.   metoprolol succinate (TOPROL-XL) 100 MG 24 hr tablet TAKE 1 TABLET DAILY WITH  ORIMMEDIATELY FOLLOWING A    MEAL ( DOSE INCREASE )   ondansetron (ZOFRAN) 4 MG tablet Take 1 tablet (4 mg total) by mouth every 8 (eight) hours as needed for nausea or vomiting.   RESTASIS 0.05 % ophthalmic emulsion Place 1 drop into both eyes 2 (two) times daily as needed (dry/irritated eyes.).   temazepam (RESTORIL) 15 MG capsule TAKE 1 CAPSULE AT BEDTIME  AS NEEDED FOR SLEEP   valsartan (DIOVAN) 160 MG tablet Take 1 tablet (160 mg total) by mouth 2  (two) times daily.   No facility-administered encounter medications on file as of 09/04/2022.    Allergies (verified) Dilaudid [hydromorphone], Morphine, Plaquenil [hydroxychloroquine], Infliximab, and Ciprofloxacin   History: Past Medical History:  Diagnosis Date   Acetabular labrum tear 10/10/2019   Acute sinusitis 02/22/2020   Allergic rhinitis 12/18/2019   Anemia 07/03/2020   Aneurysm, ascending aorta ([place])    followed br Dr Hortencia Pilar   Ascending aortic aneurysm ([place]) 06/11/2015   Body mass index (BMI) 27.0-27.9, adult 03/11/2020   Cancer ([place])    Basal cell removed from top of left hand and squamous cell will be removed from top of left hand in January of 2024   Chest pain of uncertain etiology 74/07/8785   Chronic low back pain 02/22/2020   Colonic mass 10/26/2019   DOE (dyspnea on exertion) 03/16/2019   Onset early June 2020 with assoc chest tightness  - CTa 02/23/19 several segmental and subsegmental PE with micronodular lung dz ? Etiology with no venous dopplers  - 03/16/2019   Walked RA  2 laps @  approx 218f each @ nl pace  stopped due to  End of study with some sob and chest tightness with sats 98%   - trial off acei and max rx for gerd 03/16/2019  - Echo 03/21/2019   No PH  - 03/30/2019   Walked    Droopy eyelid, left 12/18/2019   Dysphagia 01/02/2021   Eosinophilia 03/31/2019   Onset of asthma as child / [facility] eval around 2015    Essential (primary) hypertension 06/11/2015   Change from ACE inhibitor to ARB 03/16/2019 due to unexplained dry cough and chest tightness > completely resolved as of 05/23/2019    GERD (gastroesophageal reflux disease)    Ground glass opacity present on imaging of lung 01/02/2021   Headache    history of migraines, but haven't had one in several years   History of hiatal hernia    History of kidney stones    History of partial colectomy 12/18/2019   History of prosthetic unicompartmental arthroplasty of right knee 03/02/2016   Hyperglycemia  07/04/2020   Hypertension    Insomnia 12/18/2019   Localized, primary osteoarthritis of hand 02/03/2021   Mass 05/04/2017   Nasal sore 12/18/2019   Nuclear sclerotic cataract of left eye 04/21/2016   Other chronic pain 03/12/2020   Other paralytic strabismus, 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   Overweight 02/03/2021   Pain in joint of right hip 09/22/2018   Pain in limb 02/03/2021   Pain in right knee 02/03/2021   Palpitations 06/11/2015   Presence of right artificial knee joint 03/02/2016   Primary osteoarthritis 02/03/2021   Pulmonary emboli ([place] 05/24/2019     CTa 02/23/19 pos for PE 02/23/19  Venous dopplers not done, echo ok    RA (rheumatoid arthritis) ([place]    Rheumatoid arthritis ([place] 02/03/2021   S/P right unicompartmental knee replacement    06-20-2015  post dislocating plastic    Seronegative  rheumatoid arthritis ([place]) 12/18/2019   Status post right unicompartmental knee replacement 03/03/2016   SVT (supraventricular tachycardia)    Transient neurological symptoms 03/12/2020   Urticaria 02/03/2021   Vitamin D deficiency 02/03/2021   Past Surgical History:  Procedure Laterality Date   CARDIAC CATHETERIZATION  Feb 2015    [place]   per pt normal coronary arteries   CATARACT EXTRACTION W/ INTRAOCULAR LENS IMPLANT Bilateral 10/08/2012   CYSTOSCOPY W/ URETEROSCOPY W/ LITHOTRIPSY     EXCISION SUBDERMAL NECK TUMOR  08/10/2008   benign   KNEE ARTHROSCOPY W/ MENISCECTOMY Right 01/09/2015   LEFT ELBOW RECONSTRUCTION  08/10/2008   PARTIAL COLECTOMY  2019   PARTIAL KNEE ARTHROPLASTY Right 08/08/2015   Procedure: RIGHT UNICOMPARTMENTAL KNEE REVISION PLASTIC;  Surgeon: Paralee Cancel, MD;  Location: [facility];  Service: Orthopedics;  Laterality: Right;   PARTIAL KNEE ARTHROPLASTY Right 03/02/2016   Procedure: UNICOMPARTMENTAL RIGHT KNEE REVISION;  Surgeon: Paralee Cancel, MD;  Location: [facility];  Service: Orthopedics;  Laterality: Right;   REPLACEMENT UNICONDYLAR JOINT KNEE  Right 06/20/2015   RIGHT/LEFT HEART CATH AND CORONARY ANGIOGRAPHY N/A 01/30/2021   Procedure: RIGHT/LEFT HEART CATH AND CORONARY ANGIOGRAPHY;  Surgeon: Belva Crome, MD;  Location: [facility];  Service: Cardiovascular;  Laterality: N/A;   STRABISMUS SURGERY Left x6   last one --Age 40   TONSILLECTOMY     removed as a child   TRANSFORAMINAL LUMBAR INTERBODY FUSION (TLIF) WITH PEDICLE SCREW FIXATION 1 LEVEL N/A 06/25/2022   Procedure: TRANSFORAMINAL LUMBAR INTERBODY FUSION LUMBAR FOUR TO FIVE;  Surgeon: Melina Schools, MD;  Location: [facility];  Service: Orthopedics;  Laterality: N/A;  4 hrs 3 C-Bed   Family History  Problem Relation Age of Onset   Congestive Heart Failure Mother    Congestive Heart Failure Father    Social History   Socioeconomic History   Marital status: Married    Spouse name: Not on file   Number of children: 2   Years of education: Not on file   Highest education level: Not on file  Occupational History   Not on file  Tobacco Use   Smoking status: Former    Packs/day: 0.50    Years: 10.00    Total pack years: 5.00    Types: Cigarettes    Start date: 56    Quit date: 07/19/1994    Years since quitting: 28.1   Smokeless tobacco: Never  Vaping Use   Vaping Use: Never used  Substance and Sexual Activity   Alcohol use: No   Drug use: No   Sexual activity: Not on file  Other Topics Concern   Not on file  Social History Narrative   Not on file   Social Determinants of Health   Financial Resource Strain: Low Risk  (09/04/2022)   Overall Financial Resource Strain (CARDIA)    Difficulty of Paying Living Expenses: Not hard at all  Food Insecurity: No Food Insecurity (09/04/2022)   Hunger Vital Sign    Worried About Running Out of Food in the Last Year: Never true    [place] in the Last Year: Never true  Transportation Needs: No Transportation Needs (09/04/2022)   PRAPARE - Hydrologist (Medical): No    Lack of  Transportation (Non-Medical): No  Physical Activity: Inactive (09/04/2022)   Exercise Vital Sign    Days of Exercise per Week: 0 days    Minutes of Exercise per Session: 0  min  Stress: No Stress Concern Present (09/04/2022)   [place]    Feeling of Stress : Not at all  Social Connections: [place] (09/04/2022)   Social Connection and Isolation Panel [NHANES]    Frequency of Communication with Friends and Family: More than three times a week    Frequency of Social Gatherings with Friends and Family: More than three times a week    Attends Religious Services: More than 4 times per year    Active Member of [employer] or Organizations: Yes    Attends Music therapist: More than 4 times per year    Marital Status: Married    Tobacco Counseling Counseling given: Not Answered   Clinical Intake:  Pre-visit preparation completed: Yes  Pain : No/denies pain Pain Score: 0-No pain     BMI - recorded: 27.26 Nutritional Status: BMI 25 -29 Overweight Nutritional Risks: None Diabetes: No  How often do you need to have someone help you when you read instructions, pamphlets, or other written materials from your doctor or pharmacy?: 1 - Never What is the last grade level you completed in school?: HSG  Diabetic? no  Interpreter Needed?: No  Information entered by :: Lisette Abu, LPN.   Activities of Daily Living    09/04/2022    3:10 PM 06/22/2022    1:33 PM  In your present state of health, do you have any difficulty performing the following activities:  Hearing? 0   Vision? 0   Difficulty concentrating or making decisions? 0   Walking or climbing stairs? 0   Dressing or bathing? 0   Doing errands, shopping? 0 0  Preparing Food and eating ? N   Using the Toilet? N   In the past six months, have you accidently leaked urine? N   Do you have problems with loss of bowel control? N   Managing  your Medications? N   Managing your Finances? N   Housekeeping or managing your Housekeeping? N     Patient Care Team: Biagio Borg, MD as PCP - General (Internal Medicine) Berniece Salines, DO as PCP - Cardiology (Cardiology) Lynnell Dike, OD as Consulting Physician (Optometry)  Indicate any recent Medical Services you may have received from other than [facility] providers in the past year (date may be approximate).     Assessment:   This is a routine wellness examination for Ob[place]io.  Hearing/Vision screen Hearing Screening - Comments:: Denies hearing difficulties   Vision Screening - Comments:: Patent wears readers for small print only; eye exam completed by Cleon Gustin, OD.  Dietary issues and exercise activities discussed: Current Exercise Habits: The patient does not participate in regular exercise at present, Exercise limited by: orthopedic condition(s)   Goals Addressed             This Visit's Progress    Client understands the importance of follow-up with providers by attending scheduled visits.        Depression Screen    09/04/2022    3:10 PM 07/01/2022   10:39 AM 08/18/2021    4:17 PM 08/12/2021   10:21 AM 04/30/2021    2:44 PM 01/02/2021    9:12 AM 12/18/2019    2:49 PM  PHQ 2/9 Scores  PHQ - 2 Score 0 0 0 0 0 1 0    Fall Risk    09/04/2022    3:09 PM 08/18/2021    4:16 PM 08/12/2021  10:24 AM 04/30/2021    2:43 PM 12/18/2019    2:49 PM  Fall Risk   Falls in the past year? 0 0 0 0 0  Number falls in past yr: 0 0 0 0   Injury with Fall? 0 0 0 0   Risk for fall due to : No Fall Risks  No Fall Risks    Follow up Falls prevention discussed        FALL RISK PREVENTION PERTAINING TO THE HOME:  Any stairs in or around the home? Yes  If so, are there any without handrails? No  Home free of loose throw rugs in walkways, pet beds, electrical cords, etc? Yes  Adequate lighting in your home to reduce risk of falls? Yes   ASSISTIVE DEVICES UTILIZED TO PREVENT  FALLS:  Life alert? No  Use of a cane, walker or w/c? No  Grab bars in the bathroom? No  Shower chair or bench in shower? No  Elevated toilet seat or a handicapped toilet? No   TIMED UP AND GO: Phone Visit  Was the test performed? No .   Cognitive Function:        09/04/2022    3:10 PM  6CIT Screen  What Year? 0 points  What month? 0 points  What time? 0 points  Count back from 20 0 points  Months in reverse 0 points  Repeat phrase 0 points  Total Score 0 points    Immunizations  There is no immunization history on file for this patient.  TDAP status: Declined  Flu Vaccine status: Declined, Education has been provided regarding the importance of this vaccine but patient still declined. Advised may receive this vaccine at local pharmacy or Health Dept. Aware to provide a copy of the vaccination record if obtained from local pharmacy or Health Dept. Verbalized acceptance and understanding.  Pneumococcal vaccine status: Declined,  Education has been provided regarding the importance of this vaccine but patient still declined. Advised may receive this vaccine at local pharmacy or Health Dept. Aware to provide a copy of the vaccination record if obtained from local pharmacy or Health Dept. Verbalized acceptance and understanding.   Covid-19 vaccine status: Declined, Education has been provided regarding the importance of this vaccine but patient still declined. Advised may receive this vaccine at local pharmacy or Health Dept.or vaccine clinic. Aware to provide a copy of the vaccination record if obtained from local pharmacy or Health Dept. Verbalized acceptance and understanding.  Qualifies for Shingles Vaccine? Yes   Zostavax completed No   Shingrix Completed?: No.    Education has been provided regarding the importance of this vaccine. Patient has been advised to call insurance company to determine out of pocket expense if they have not yet received this vaccine. Advised may  also receive vaccine at local pharmacy or Health Dept. Verbalized acceptance and understanding.  Screening Tests Health Maintenance  Topic Date Due   COVID-19 Vaccine (1) Never done   DTaP/Tdap/Td (1 - Tdap) Never done   Zoster Vaccines- Shingrix (1 of 2) Never done   Pneumonia Vaccine 59+ Years old (1 - PCV) Never done   INFLUENZA VACCINE  Never done   Medicare Annual Wellness (AWV)  09/05/2023   COLONOSCOPY (Pts 45-34yr Insurance coverage will need to be confirmed)  10/05/2029   Hepatitis C Screening  Completed   HIV Screening  Completed   HPV VACCINES  Aged Out    Health Maintenance  Health Maintenance Due  Topic Date Due  COVID-19 Vaccine (1) Never done   DTaP/Tdap/Td (1 - Tdap) Never done   Zoster Vaccines- Shingrix (1 of 2) Never done   Pneumonia Vaccine 71+ Years old (1 - PCV) Never done   INFLUENZA VACCINE  Never done    Colorectal cancer screening: Type of screening: Colonoscopy. Completed 10/06/2019. Repeat every 10 years  Lung Cancer Screening: (Low Dose CT Chest recommended if Age 35-80 years, 30 pack-year currently smoking OR have quit w/in 15years.) does not qualify.   Lung Cancer Screening Referral: no  Additional Screening:  Hepatitis C Screening: does qualify; Completed 08/18/2021  Vision Screening: Recommended annual ophthalmology exams for early detection of glaucoma and other disorders of the eye. Is the patient up to date with their annual eye exam?  Yes  Who is the provider or what is the name of the office in which the patient attends annual eye exams? [place], OD. If pt is not established with a provider, would they like to be referred to a provider to establish care? No .   Dental Screening: Recommended annual dental exams for proper oral hygiene  Community Resource Referral / Chronic Care Management: CRR required this visit?  No   CCM required this visit?  No      Plan:     I have personally reviewed and noted the following in the  patient's chart:   Medical and social history Use of alcohol, tobacco or illicit drugs  Current medications and supplements including opioid prescriptions. Patient is not currently taking opioid prescriptions. Functional ability and status Nutritional status Physical activity Advanced directives List of other physicians Hospitalizations, surgeries, and ER visits in previous 12 months Vitals Screenings to include cognitive, depression, and falls Referrals and appointments  In addition, I have reviewed and discussed with patient certain preventive protocols, quality metrics, and best practice recommendations. A written personalized care plan for preventive services as well as general preventive health recommendations were provided to patient.     [name], LPN   10/15/6576   Nurse Notes: N/A

## 2022-09-04 NOTE — Patient Instructions (Signed)
Luis Wilkinson , Thank you for taking time to come for your Medicare Wellness Visit. I appreciate your ongoing commitment to your health goals. Please review the following plan we discussed and let me know if I can assist you in the future.   These are the goals we discussed:  Goals      Client understands the importance of follow-up with providers by attending scheduled visits.        This is a list of the screening recommended for you and due dates:  Health Maintenance  Topic Date Due   COVID-19 Vaccine (1) Never done   DTaP/Tdap/Td vaccine (1 - Tdap) Never done   Zoster (Shingles) Vaccine (1 of 2) Never done   Pneumonia Vaccine (1 - PCV) Never done   Flu Shot  Never done   Medicare Annual Wellness Visit  09/05/2023   Colon Cancer Screening  10/05/2029   Hepatitis C Screening: USPSTF Recommendation to screen - Ages 18-79 yo.  Completed   HIV Screening  Completed   HPV Vaccine  Aged Out    Advanced directives: Yes; documents on file.  Conditions/risks identified: Yes  Next appointment: Follow up in one year for your annual wellness visit.   Preventive Care 66 Years and Older, Male  Preventive care refers to lifestyle choices and visits with your health care provider that can promote health and wellness. What does preventive care include? A yearly physical exam. This is also called an annual well check. Dental exams once or twice a year. Routine eye exams. Ask your health care provider how often you should have your eyes checked. Personal lifestyle choices, including: Daily care of your teeth and gums. Regular physical activity. Eating a healthy diet. Avoiding tobacco and drug use. Limiting alcohol use. Practicing safe sex. Taking low doses of aspirin every day. Taking vitamin and mineral supplements as recommended by your health care provider. What happens during an annual well check? The services and screenings done by your health care provider during your annual well  check will depend on your age, overall health, lifestyle risk factors, and family history of disease. Counseling  Your health care provider may ask you questions about your: Alcohol use. Tobacco use. Drug use. Emotional well-being. Home and relationship well-being. Sexual activity. Eating habits. History of falls. Memory and ability to understand (cognition). Work and work Statistician. Screening  You may have the following tests or measurements: Height, weight, and BMI. Blood pressure. Lipid and cholesterol levels. These may be checked every 5 years, or more frequently if you are over 29 years old. Skin check. Lung cancer screening. You may have this screening every year starting at age 95 if you have a 30-pack-year history of smoking and currently smoke or have quit within the past 15 years. Fecal occult blood test (FOBT) of the stool. You may have this test every year starting at age 23. Flexible sigmoidoscopy or colonoscopy. You may have a sigmoidoscopy every 5 years or a colonoscopy every 10 years starting at age 6. Prostate cancer screening. Recommendations will vary depending on your family history and other risks. Hepatitis C blood test. Hepatitis B blood test. Sexually transmitted disease (STD) testing. Diabetes screening. This is done by checking your blood sugar (glucose) after you have not eaten for a while (fasting). You may have this done every 1-3 years. Abdominal aortic aneurysm (AAA) screening. You may need this if you are a current or former smoker. Osteoporosis. You may be screened starting at age 53 if you  are at high risk. Talk with your health care provider about your test results, treatment options, and if necessary, the need for more tests. Vaccines  Your health care provider may recommend certain vaccines, such as: Influenza vaccine. This is recommended every year. Tetanus, diphtheria, and acellular pertussis (Tdap, Td) vaccine. You may need a Td booster  every 10 years. Zoster vaccine. You may need this after age 36. Pneumococcal 13-valent conjugate (PCV13) vaccine. One dose is recommended after age 52. Pneumococcal polysaccharide (PPSV23) vaccine. One dose is recommended after age 69. Talk to your health care provider about which screenings and vaccines you need and how often you need them. This information is not intended to replace advice given to you by your health care provider. Make sure you discuss any questions you have with your health care provider. Document Released: 08/23/2015 Document Revised: 04/15/2016 Document Reviewed: 05/28/2015 Elsevier Interactive Patient Education  2017 [place] Prevention in the Home Falls can cause injuries. They can happen to people of all ages. There are many things you can do to make your home safe and to help prevent falls. What can I do on the outside of my home? Regularly fix the edges of walkways and driveways and fix any cracks. Remove anything that might make you trip as you walk through a door, such as a raised step or threshold. Trim any bushes or trees on the path to your home. Use bright outdoor lighting. Clear any walking paths of anything that might make someone trip, such as rocks or tools. Regularly check to see if handrails are loose or broken. Make sure that both sides of any steps have handrails. Any raised decks and porches should have guardrails on the edges. Have any leaves, snow, or ice cleared regularly. Use sand or salt on walking paths during winter. Clean up any spills in your garage right away. This includes oil or grease spills. What can I do in the bathroom? Use night lights. Install grab bars by the toilet and in the tub and shower. Do not use towel bars as grab bars. Use non-skid mats or decals in the tub or shower. If you need to sit down in the shower, use a plastic, non-slip stool. Keep the floor dry. Clean up any water that spills on the floor as soon  as it happens. Remove soap buildup in the tub or shower regularly. Attach bath mats securely with double-sided non-slip rug tape. Do not have throw rugs and other things on the floor that can make you trip. What can I do in the bedroom? Use night lights. Make sure that you have a light by your bed that is easy to reach. Do not use any sheets or blankets that are too big for your bed. They should not hang down onto the floor. Have a firm chair that has side arms. You can use this for support while you get dressed. Do not have throw rugs and other things on the floor that can make you trip. What can I do in the kitchen? Clean up any spills right away. Avoid walking on wet floors. Keep items that you use a lot in easy-to-reach places. If you need to reach something above you, use a strong step stool that has a grab bar. Keep electrical cords out of the way. Do not use floor polish or wax that makes floors slippery. If you must use wax, use non-skid floor wax. Do not have throw rugs and other things on the  floor that can make you trip. What can I do with my stairs? Do not leave any items on the stairs. Make sure that there are handrails on both sides of the stairs and use them. Fix handrails that are broken or loose. Make sure that handrails are as long as the stairways. Check any carpeting to make sure that it is firmly attached to the stairs. Fix any carpet that is loose or worn. Avoid having throw rugs at the top or bottom of the stairs. If you do have throw rugs, attach them to the floor with carpet tape. Make sure that you have a light switch at the top of the stairs and the bottom of the stairs. If you do not have them, ask someone to add them for you. What else can I do to help prevent falls? Wear shoes that: Do not have high heels. Have rubber bottoms. Are comfortable and fit you well. Are closed at the toe. Do not wear sandals. If you use a stepladder: Make sure that it is fully  opened. Do not climb a closed stepladder. Make sure that both sides of the stepladder are locked into place. Ask someone to hold it for you, if possible. Clearly mark and make sure that you can see: Any grab bars or handrails. First and last steps. Where the edge of each step is. Use tools that help you move around (mobility aids) if they are needed. These include: Canes. Walkers. Scooters. Crutches. Turn on the lights when you go into a dark area. Replace any light bulbs as soon as they burn out. Set up your furniture so you have a clear path. Avoid moving your furniture around. If any of your floors are uneven, fix them. If there are any pets around you, be aware of where they are. Review your medicines with your doctor. Some medicines can make you feel dizzy. This can increase your chance of falling. Ask your doctor what other things that you can do to help prevent falls. This information is not intended to replace advice given to you by your health care provider. Make sure you discuss any questions you have with your health care provider. Document Released: 05/23/2009 Document Revised: 01/02/2016 Document Reviewed: 08/31/2014 Elsevier Interactive Patient Education  2017 [employer].

## 2022-09-10 ENCOUNTER — Ambulatory Visit (INDEPENDENT_AMBULATORY_CARE_PROVIDER_SITE_OTHER): Payer: Medicare Other | Admitting: Internal Medicine

## 2022-09-10 ENCOUNTER — Telehealth: Payer: Self-pay

## 2022-09-10 ENCOUNTER — Encounter: Payer: Self-pay | Admitting: Internal Medicine

## 2022-09-10 VITALS — BP 136/84 | HR 68 | Temp 97.8°F | Ht 70.0 in | Wt 189.0 lb

## 2022-09-10 DIAGNOSIS — R739 Hyperglycemia, unspecified: Secondary | ICD-10-CM

## 2022-09-10 DIAGNOSIS — E538 Deficiency of other specified B group vitamins: Secondary | ICD-10-CM

## 2022-09-10 DIAGNOSIS — R0609 Other forms of dyspnea: Secondary | ICD-10-CM | POA: Diagnosis not present

## 2022-09-10 DIAGNOSIS — Z20828 Contact with and (suspected) exposure to other viral communicable diseases: Secondary | ICD-10-CM

## 2022-09-10 DIAGNOSIS — M79641 Pain in right hand: Secondary | ICD-10-CM | POA: Insufficient documentation

## 2022-09-10 DIAGNOSIS — I10 Essential (primary) hypertension: Secondary | ICD-10-CM | POA: Diagnosis not present

## 2022-09-10 DIAGNOSIS — N320 Bladder-neck obstruction: Secondary | ICD-10-CM

## 2022-09-10 DIAGNOSIS — E559 Vitamin D deficiency, unspecified: Secondary | ICD-10-CM | POA: Diagnosis not present

## 2022-09-10 LAB — CBC WITH DIFFERENTIAL/PLATELET
Basophils Absolute: 0.1 10*3/uL (ref 0.0–0.1)
Basophils Relative: 0.8 % (ref 0.0–3.0)
Eosinophils Absolute: 0 10*3/uL (ref 0.0–0.7)
Eosinophils Relative: 0.4 % (ref 0.0–5.0)
HCT: 36.4 % — ABNORMAL LOW (ref 39.0–52.0)
Hemoglobin: 12.2 g/dL — ABNORMAL LOW (ref 13.0–17.0)
Lymphocytes Relative: 11.6 % — ABNORMAL LOW (ref 12.0–46.0)
Lymphs Abs: 0.8 10*3/uL (ref 0.7–4.0)
MCHC: 33.5 g/dL (ref 30.0–36.0)
MCV: 95.6 fl (ref 78.0–100.0)
Monocytes Absolute: 0.4 10*3/uL (ref 0.1–1.0)
Monocytes Relative: 5.9 % (ref 3.0–12.0)
Neutro Abs: 5.8 10*3/uL (ref 1.4–7.7)
Neutrophils Relative %: 81.3 % — ABNORMAL HIGH (ref 43.0–77.0)
Platelets: 243 10*3/uL (ref 150.0–400.0)
RBC: 3.81 Mil/uL — ABNORMAL LOW (ref 4.22–5.81)
RDW: 14.6 % (ref 11.5–15.5)
WBC: 7.2 10*3/uL (ref 4.0–10.5)

## 2022-09-10 LAB — BASIC METABOLIC PANEL
BUN: 31 mg/dL — ABNORMAL HIGH (ref 6–23)
CO2: 30 mEq/L (ref 19–32)
Calcium: 9.4 mg/dL (ref 8.4–10.5)
Chloride: 101 mEq/L (ref 96–112)
Creatinine, Ser: 1.45 mg/dL (ref 0.40–1.50)
GFR: 50.44 mL/min — ABNORMAL LOW (ref >60.00)
Glucose, Bld: 110 mg/dL — ABNORMAL HIGH (ref 70–99)
Potassium: 4.7 mEq/L (ref 3.5–5.1)
Sodium: 138 mEq/L (ref 135–145)

## 2022-09-10 LAB — URINALYSIS, ROUTINE W REFLEX MICROSCOPIC
Bilirubin Urine: NEGATIVE
Hgb urine dipstick: NEGATIVE
Leukocytes,Ua: NEGATIVE
Nitrite: NEGATIVE
RBC / HPF: NONE SEEN (ref 0–?)
Specific Gravity, Urine: 1.025 (ref 1.000–1.030)
Total Protein, Urine: NEGATIVE
Urine Glucose: NEGATIVE
Urobilinogen, UA: 0.2 (ref 0.0–1.0)
pH: 5.5 (ref 5.0–8.0)

## 2022-09-10 LAB — VITAMIN B12: Vitamin B-12: 251 pg/mL (ref 211–911)

## 2022-09-10 LAB — LIPID PANEL
Cholesterol: 180 mg/dL (ref 0–200)
HDL: 51.8 mg/dL (ref >39.00)
LDL Cholesterol: 98 mg/dL (ref 0–99)
NonHDL: 128.69
Total CHOL/HDL Ratio: 3
Triglycerides: 151 mg/dL — ABNORMAL HIGH (ref 0.0–149.0)
VLDL: 30.2 mg/dL (ref 0.0–40.0)

## 2022-09-10 LAB — TSH: TSH: 0.51 u[IU]/mL (ref 0.35–5.50)

## 2022-09-10 LAB — HEPATIC FUNCTION PANEL
ALT: 13 U/L (ref 0–53)
AST: 17 U/L (ref 0–37)
Albumin: 4.4 g/dL (ref 3.5–5.2)
Alkaline Phosphatase: 67 U/L (ref 39–117)
Bilirubin, Direct: 0.1 mg/dL (ref 0.0–0.3)
Total Bilirubin: 0.5 mg/dL (ref 0.2–1.2)
Total Protein: 6.4 g/dL (ref 6.0–8.3)

## 2022-09-10 LAB — VITAMIN D 25 HYDROXY (VIT D DEFICIENCY, FRACTURES): VITD: 31.83 ng/mL (ref 30.00–100.00)

## 2022-09-10 LAB — HEMOGLOBIN A1C: Hgb A1c MFr Bld: 5.6 % (ref 4.6–6.5)

## 2022-09-10 LAB — PSA: PSA: 0.68 ng/mL (ref 0.10–4.00)

## 2022-09-10 MED ORDER — MUPIROCIN 2 % EX OINT: 1.0000 | TOPICAL_OINTMENT | Freq: Two times a day (BID) | CUTANEOUS | 0 refills | Status: AC

## 2022-09-10 NOTE — Assessment & Plan Note (Signed)
Last vitamin D Lab Results  Component Value Date   VD25OH 31.83 09/10/2022   Low, to start oral replacement

## 2022-09-10 NOTE — Assessment & Plan Note (Signed)
BP Readings from Last 3 Encounters:  09/10/22 136/84  08/28/22 (!) 130/90  06/26/22 (!) 158/92   Stable, pt to continue medical treatment hct 12.5 qd, diovan 160 qd, toprol xl 100 qd

## 2022-09-10 NOTE — Patient Instructions (Addendum)

## 2022-09-10 NOTE — Assessment & Plan Note (Addendum)
Given recent extensive eval,  I suspect deconditioning - pt advised for wt loss to 175

## 2022-09-10 NOTE — Progress Notes (Signed)
Patient ID: Luis Wilkinson, male   DOB: 1955-12-14, 67 y.o.   MRN: [id number]        Chief Complaint: follow up chronic doe, htn, hld, low vit d       HPI:  Luis Wilkinson is a 67 y.o. male here overall doing ok, Pt denies chest pain, increased sob or doe, wheezing, orthopnea, PND, increased LE swelling, palpitations, dizziness or syncope, but still has unexplained doe; has had extensive evaluation, cardiology recently increased diovan to 160 mg.   Pt denies polydipsia, polyuria, or new focal neuro s/s.    Pt denies fever, wt loss, night sweats, loss of appetite, or other constitutional symptoms  Will consider shingrx at pharmacy but asks for varicella titer prior.           Wt Readings from Last 3 Encounters:  09/10/22 189 lb (85.7 kg)  09/04/22 190 lb (86.2 kg)  08/28/22 190 lb 9.6 oz (86.5 kg)   BP Readings from Last 3 Encounters:  09/10/22 136/84  08/28/22 (!) 130/90  06/26/22 (!) 158/92         Past Medical History:  Diagnosis Date   Acetabular labrum tear 10/10/2019   Acute sinusitis 02/22/2020   Allergic rhinitis 12/18/2019   Anemia 07/03/2020   Aneurysm, ascending aorta ([place])    followed br Dr Hortencia Pilar   Ascending aortic aneurysm ([place]) 06/11/2015   Body mass index (BMI) 27.0-27.9, adult 03/11/2020   Cancer ([place])    Basal cell removed from top of left hand and 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 Etiology with no venous dopplers  - 03/16/2019   Walked RA  2 laps @  approx 255f each @ nl pace  stopped due to  End of study with some sob and chest tightness with sats 98%   - trial off acei and max rx for gerd 03/16/2019  - Echo 03/21/2019   No PH  - 03/30/2019   Walked    Droopy  eyelid, left 12/18/2019   Dysphagia 01/02/2021   Eosinophilia 03/31/2019   Onset of asthma as child / [facility] eval around 2015    Essential (primary) hypertension 06/11/2015   Change from ACE inhibitor to ARB 03/16/2019 due to unexplained dry cough and chest tightness > completely resolved as of 05/23/2019    GERD (gastroesophageal reflux disease)    Ground glass opacity present on imaging of lung 01/02/2021   Headache    history of migraines, but haven't had one in several years   History of hiatal hernia    History of kidney stones    History of partial colectomy 12/18/2019   History of prosthetic unicompartmental arthroplasty of right knee 03/02/2016   Hyperglycemia 07/04/2020   Hypertension    Insomnia 12/18/2019   Localized, primary osteoarthritis of hand 02/03/2021   Mass 05/04/2017   Nasal sore 12/18/2019   Nuclear sclerotic cataract of left eye 04/21/2016   Other chronic pain 03/12/2020   Other paralytic strabismus, 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   Overweight 02/03/2021   Pain in joint of right hip 09/22/2018   Pain in limb 02/03/2021   Pain in right knee 02/03/2021   Palpitations 06/11/2015  Presence of right artificial knee joint 03/02/2016   Primary osteoarthritis 02/03/2021   Pulmonary emboli ([place]) 05/24/2019     CTa 02/23/19 pos for PE 02/23/19  Venous dopplers not done, echo ok    RA (rheumatoid arthritis) ([place])    Rheumatoid arthritis ([place]) 02/03/2021   S/P right unicompartmental knee replacement    06-20-2015  post dislocating plastic    Seronegative rheumatoid arthritis ([place]) 12/18/2019   Status post right unicompartmental knee replacement 03/03/2016   SVT (supraventricular tachycardia)    Transient neurological symptoms 03/12/2020   Urticaria 02/03/2021   Vitamin D deficiency 02/03/2021   Past Surgical History:  Procedure Laterality Date   CARDIAC CATHETERIZATION  Feb 2015    [place]   per pt normal coronary arteries   CATARACT EXTRACTION W/ INTRAOCULAR LENS  IMPLANT Bilateral 10/08/2012   CYSTOSCOPY W/ URETEROSCOPY W/ LITHOTRIPSY     EXCISION SUBDERMAL NECK TUMOR  08/10/2008   benign   KNEE ARTHROSCOPY W/ MENISCECTOMY Right 01/09/2015   LEFT ELBOW RECONSTRUCTION  08/10/2008   PARTIAL COLECTOMY  2019   PARTIAL KNEE ARTHROPLASTY Right 08/08/2015   Procedure: RIGHT UNICOMPARTMENTAL KNEE REVISION PLASTIC;  Surgeon: Paralee Cancel, MD;  Location: [facility];  Service: Orthopedics;  Laterality: Right;   PARTIAL KNEE ARTHROPLASTY Right 03/02/2016   Procedure: UNICOMPARTMENTAL RIGHT KNEE REVISION;  Surgeon: Paralee Cancel, MD;  Location: [facility];  Service: Orthopedics;  Laterality: Right;   REPLACEMENT UNICONDYLAR JOINT KNEE Right 06/20/2015   RIGHT/LEFT HEART CATH AND CORONARY ANGIOGRAPHY N/A 01/30/2021   Procedure: RIGHT/LEFT HEART CATH AND CORONARY ANGIOGRAPHY;  Surgeon: Belva Crome, MD;  Location: [facility];  Service: Cardiovascular;  Laterality: N/A;   STRABISMUS SURGERY Left x6   last one --Age 71   TONSILLECTOMY     removed as a child   TRANSFORAMINAL LUMBAR INTERBODY FUSION (TLIF) WITH PEDICLE SCREW FIXATION 1 LEVEL N/A 06/25/2022   Procedure: TRANSFORAMINAL LUMBAR INTERBODY FUSION LUMBAR FOUR TO FIVE;  Surgeon: Melina Schools, MD;  Location: [facility];  Service: Orthopedics;  Laterality: N/A;  4 hrs 3 C-Bed    reports that he quit smoking about 28 years ago. His smoking use included cigarettes. He started smoking about 52 years ago. He has a 5.00 pack-year smoking history. He has never used smokeless tobacco. He reports that he does not drink alcohol and does not use drugs. family history includes Congestive Heart Failure in his father and mother. Allergies  Allergen Reactions   Dilaudid [Hydromorphone] Shortness Of Breath and Other (See Comments)    Chest tight   Morphine Shortness Of Breath    SEVERE   Plaquenil [Hydroxychloroquine] Itching, Swelling and Rash   Infliximab Rash and Other (See Comments)     (REMICADE)SEVERE   Ciprofloxacin Other (See Comments)    Thoracic aortic aneurysm   Current Outpatient Medications on File Prior to Visit  Medication Sig Dispense Refill   albuterol (VENTOLIN HFA) 108 (90 Base) MCG/ACT inhaler INHALE 2 PUFFS INTO THE LUNGS EVERY 6 (SIX) HOURS AS NEEDED FOR WHEEZING OR SHORTNESS OF BREATH. 18 g 5   atorvastatin (LIPITOR) 10 MG tablet TAKE 1 TABLET DAILY 90 tablet 3   DULoxetine (CYMBALTA) 60 MG capsule TAKE 1 CAPSULE DAILY 90 capsule 2   famotidine (PEPCID) 20 MG tablet Take 1 tablet (20 mg total) by mouth in the morning. 90 tablet 3   flecainide (TAMBOCOR) 50 MG tablet Take 1 tablet (50 mg total) by mouth 2 (two) times daily. 180 tablet 1  Glucosamine-Chondroit-Vit C-Mn (GLUCOSAMINE 1500 COMPLEX) CAPS Take 1 capsule by mouth in the morning.     hydrochlorothiazide (MICROZIDE) 12.5 MG capsule Take 1 capsule (12.5 mg total) by mouth daily. 90 capsule 3   leflunomide (ARAVA) 20 MG tablet Take 20 mg by mouth in the morning.     loratadine-pseudoephedrine (CLARITIN-D 24-HOUR) 10-240 MG 24 hr tablet Take 1 tablet by mouth in the morning.     metoprolol succinate (TOPROL-XL) 100 MG 24 hr tablet TAKE 1 TABLET DAILY WITH ORIMMEDIATELY FOLLOWING A    MEAL ( DOSE INCREASE ) 90 tablet 2   ondansetron (ZOFRAN) 4 MG tablet Take 1 tablet (4 mg total) by mouth every 8 (eight) hours as needed for nausea or vomiting. 20 tablet 0   RESTASIS 0.05 % ophthalmic emulsion Place 1 drop into both eyes 2 (two) times daily as needed (dry/irritated eyes.).     temazepam (RESTORIL) 15 MG capsule TAKE 1 CAPSULE AT BEDTIME  AS NEEDED FOR SLEEP 90 capsule 0   valsartan (DIOVAN) 160 MG tablet Take 1 tablet (160 mg total) by mouth 2 (two) times daily. 180 tablet 3   amoxicillin (AMOXIL) 500 MG capsule TAKE 4 CAPS BY MOUTH 1 HOUR PRIOR TO DENTAL APPOINTMENT     predniSONE (DELTASONE) 5 MG tablet TAKE 1 TABLET ONCE DAILY for 90     No current facility-administered medications on file prior to  visit.        ROS:  All others reviewed and negative.  Objective        PE:  BP 136/84 (BP Location: Right Arm, Patient Position: Sitting, Cuff Size: Large)   Pulse 68   Temp 97.8 F (36.6 C) (Oral)   Ht '5\' 10"'$  (1.778 m)   Wt 189 lb (85.7 kg)   SpO2 95%   BMI 27.12 kg/m                 Constitutional: Pt appears in NAD               HENT: Head: NCAT.                Right Ear: External ear normal.                 Left Ear: External ear normal.                Eyes: . Pupils are equal, round, and reactive to light. Conjunctivae and EOM are normal               Nose: without d/c or deformity               Neck: Neck supple. Gross normal ROM               Cardiovascular: Normal rate and regular rhythm.                 Pulmonary/Chest: Effort normal and breath sounds without rales or wheezing.                Abd:  Soft, NT, ND, + BS, no organomegaly               Neurological: Pt is alert. At baseline orientation, motor grossly intact               Skin: Skin is warm. No rashes, no other new lesions, LE edema - none               Psychiatric: Pt  behavior is normal without agitation   Micro: none  Cardiac tracings I have personally interpreted today:  none  Pertinent Radiological findings (summarize): none   Lab Results  Component Value Date   WBC 7.2 09/10/2022   HGB 12.2 (L) 09/10/2022   HCT 36.4 (L) 09/10/2022   PLT 243.0 09/10/2022   GLUCOSE 110 (H) 09/10/2022   CHOL 180 09/10/2022   TRIG 151.0 (H) 09/10/2022   HDL 51.80 09/10/2022   LDLDIRECT 160.0 08/18/2021   LDLCALC 98 09/10/2022   ALT 13 09/10/2022   AST 17 09/10/2022   NA 138 09/10/2022   K 4.7 09/10/2022   CL 101 09/10/2022   CREATININE 1.45 09/10/2022   BUN 31 (H) 09/10/2022   CO2 30 09/10/2022   TSH 0.51 09/10/2022   PSA 0.68 09/10/2022   HGBA1C 5.6 09/10/2022   Assessment/Plan:  Luis Wilkinson is a 67 y.o. White or Caucasian [1] male with  has a past medical history of Acetabular labrum tear  (10/10/2019), Acute sinusitis (02/22/2020), Allergic rhinitis (12/18/2019), Anemia (07/03/2020), Aneurysm, ascending aorta ([place]), Ascending aortic aneurysm ([place]) (06/11/2015), Body mass index (BMI) 27.0-27.9, adult (03/11/2020), Cancer ([place]), Chest pain of uncertain etiology (29/79/8921), Chronic low back pain (02/22/2020), Colonic mass (10/26/2019), DOE (dyspnea on exertion) (03/16/2019), Droopy eyelid, left (12/18/2019), Dysphagia (01/02/2021), Eosinophilia (03/31/2019), Essential (primary) hypertension (06/11/2015), GERD (gastroesophageal reflux disease), Ground glass opacity present on imaging of lung (01/02/2021), Headache, History of hiatal hernia, History of kidney stones, History of partial colectomy (12/18/2019), History of prosthetic unicompartmental arthroplasty of right knee (03/02/2016), Hyperglycemia (07/04/2020), Hypertension, Insomnia (12/18/2019), Localized, primary osteoarthritis of hand (02/03/2021), Mass (05/04/2017), Nasal sore (12/18/2019), Nuclear sclerotic cataract of left eye (04/21/2016), Other chronic pain (03/12/2020), Other paralytic strabismus, left eye (12/18/2019), Overweight (02/03/2021), Pain in joint of right hip (09/22/2018), Pain in limb (02/03/2021), Pain in right knee (02/03/2021), Palpitations (06/11/2015), Presence of right artificial knee joint (03/02/2016), Primary osteoarthritis (02/03/2021), Pulmonary emboli ([place]) (05/24/2019), RA (rheumatoid arthritis) ([place]), Rheumatoid arthritis ([place]) (02/03/2021), S/P right unicompartmental knee replacement, Seronegative rheumatoid arthritis ([place]) (12/18/2019), Status post right unicompartmental knee replacement (03/03/2016), SVT (supraventricular tachycardia), Transient neurological symptoms (03/12/2020), Urticaria (02/03/2021), and Vitamin D deficiency (02/03/2021).  DOE (dyspnea on exertion) Given recent extensive eval,  I suspect deconditioning - pt advised for wt loss to 175  Essential (primary) hypertension BP Readings from  Last 3 Encounters:  09/10/22 136/84  08/28/22 (!) 130/90  06/26/22 (!) 158/92   Stable, pt to continue medical treatment hct 12.5 qd, diovan 160 qd, toprol xl 100 qd    Hyperglycemia Lab Results  Component Value Date   HGBA1C 5.6 09/10/2022   Stable, pt to continue current medical treatment  - diet, wt control   Vitamin D deficiency Last vitamin D Lab Results  Component Value Date   VD25OH 31.83 09/10/2022   Low, to start oral replacement  Followup: Return in about 6 months (around 03/11/2023).  Cathlean Cower, MD 09/10/2022 8:55 PM [facility]

## 2022-09-10 NOTE — Assessment & Plan Note (Signed)
Lab Results  Component Value Date   HGBA1C 5.6 09/10/2022   Stable, pt to continue current medical treatment  - diet, wt control

## 2022-09-11 LAB — VARICELLA ZOSTER ANTIBODY, IGG: Varicella IgG: 3177 index

## 2022-09-11 NOTE — Telephone Encounter (Signed)
error 

## 2022-09-13 ENCOUNTER — Encounter: Payer: Self-pay | Admitting: Internal Medicine

## 2022-09-14 ENCOUNTER — Encounter: Payer: Self-pay | Admitting: Cardiology

## 2022-09-14 MED ORDER — AMLODIPINE BESYLATE 5 MG PO TABS: 5.0000 mg | ORAL_TABLET | Freq: Every day | ORAL | 3 refills | Status: DC

## 2022-09-15 ENCOUNTER — Other Ambulatory Visit: Payer: Self-pay

## 2022-09-15 MED ORDER — AMLODIPINE BESYLATE 5 MG PO TABS: 5.0000 mg | ORAL_TABLET | Freq: Every day | ORAL | 3 refills | Status: DC

## 2022-09-21 ENCOUNTER — Other Ambulatory Visit: Payer: Self-pay | Admitting: Internal Medicine

## 2022-09-23 ENCOUNTER — Encounter: Payer: Self-pay | Admitting: Cardiology

## 2022-10-21 NOTE — Telephone Encounter (Signed)
  Patient given instructions for stress echo. Luis Wilkinson Jacqueline  

## 2022-10-23 ENCOUNTER — Other Ambulatory Visit: Payer: Self-pay | Admitting: Cardiology

## 2022-10-27 ENCOUNTER — Ambulatory Visit (HOSPITAL_COMMUNITY): Payer: Medicare Other

## 2022-10-27 ENCOUNTER — Ambulatory Visit (HOSPITAL_COMMUNITY): Payer: Medicare Other | Attending: Cardiology

## 2022-10-27 DIAGNOSIS — I428 Other cardiomyopathies: Secondary | ICD-10-CM | POA: Diagnosis not present

## 2022-10-28 LAB — ECHOCARDIOGRAM STRESS TEST
Area-P 1/2: 4.21 cm2
S' Lateral: 2.4 cm

## 2022-11-23 ENCOUNTER — Encounter: Payer: Self-pay | Admitting: Cardiology

## 2022-11-23 ENCOUNTER — Encounter: Payer: Self-pay | Admitting: Internal Medicine

## 2022-11-30 ENCOUNTER — Other Ambulatory Visit: Payer: Self-pay | Admitting: Internal Medicine

## 2022-12-14 ENCOUNTER — Encounter: Payer: Self-pay | Admitting: Cardiology

## 2022-12-14 ENCOUNTER — Ambulatory Visit: Payer: Medicare Other | Attending: Cardiology | Admitting: Cardiology

## 2022-12-14 VITALS — BP 122/74 | HR 70 | Ht 70.0 in | Wt 183.0 lb

## 2022-12-14 DIAGNOSIS — Z79899 Other long term (current) drug therapy: Secondary | ICD-10-CM | POA: Insufficient documentation

## 2022-12-14 DIAGNOSIS — I10 Essential (primary) hypertension: Secondary | ICD-10-CM

## 2022-12-14 DIAGNOSIS — R400 Somnolence: Secondary | ICD-10-CM | POA: Diagnosis not present

## 2022-12-14 DIAGNOSIS — R0683 Snoring: Secondary | ICD-10-CM | POA: Diagnosis not present

## 2022-12-14 DIAGNOSIS — R0609 Other forms of dyspnea: Secondary | ICD-10-CM

## 2022-12-14 MED ORDER — VERAPAMIL HCL ER 180 MG PO TBCR: 180.0000 mg | EXTENDED_RELEASE_TABLET | Freq: Two times a day (BID) | ORAL | 2 refills | Status: DC

## 2022-12-14 MED ORDER — LORAZEPAM 1 MG PO TABS: ORAL_TABLET | ORAL | 0 refills | Status: AC

## 2022-12-14 MED ORDER — VERAPAMIL HCL ER 180 MG PO TBCR: 180.0000 mg | EXTENDED_RELEASE_TABLET | Freq: Two times a day (BID) | ORAL | 3 refills | Status: AC

## 2022-12-14 MED ORDER — VERAPAMIL HCL ER 180 MG PO TBCR: 180.0000 mg | EXTENDED_RELEASE_TABLET | Freq: Two times a day (BID) | ORAL | 3 refills | Status: DC

## 2022-12-14 NOTE — Patient Instructions (Addendum)
Medication Instructions:  Your physician has recommended you make the following change in your medication:  STOP: Amlodipine STOP: Valsartan START: Verapamil 180 mg twice daily    *If you need a refill on your cardiac medications before your next appointment, please call your pharmacy*   Lab Work: Your physician recommends that you return to have labs in 2 weeks: Flecainide  If you have labs (blood work) drawn today and your tests are completely normal, you will receive your results only by: MyChart Message (if you have MyChart) OR A paper copy in the mail If you have any lab test that is abnormal or we need to change your treatment, we will call you to review the results.   Testing/Procedures: Your physician has recommended that you have a sleep study. This test records several body functions during sleep, including: brain activity, eye movement, oxygen and carbon dioxide blood levels, heart rate and rhythm, breathing rate and rhythm, the flow of air through your mouth and nose, snoring, body muscle movements, and chest and belly movement.    You will be scheduled for a Cardiac MRI Please arrive for your appointment arrive 30-45 minutes prior to test start time.? You have been prescribed Ativan 1 mg to take upon arrival to your MRI appointment. You must have a driver the day of this test since you have been prescribed Ativan.  Please hold Temazepam (Restoril) day of your procedure.   [facility] [address] [place], [place] [address] [phone] Please take advantage of the free valet parking available at the MAIN entrance (A entrance).  Proceed to the [facility] Radiology Department (First Floor) for check-in.    Magnetic resonance imaging (MRI) is a painless test that produces images of the inside of the body without using Xrays.  During an MRI, strong magnets and radio waves work together in a Data processing manager to form detailed images.   MRI images may  provide more details about a medical condition than X-rays, CT scans, and ultrasounds can provide.  You may be given earphones to listen for instructions.  You may eat a light breakfast and take medications as ordered with the exception of furosemide, hydrochlorothiazide, or spironolactone(fluid pill, other). Please avoid stimulants for 12 hr prior to test. (Ie. Caffeine, nicotine, chocolate, or antihistamine medications)  If a contrast material will be used, an IV will be inserted into one of your veins. Contrast material will be injected into your IV. It will leave your body through your urine within a day. You may be told to drink plenty of fluids to help flush the contrast material out of your system.  You will be asked to remove all metal, including: Watch, jewelry, and other metal objects including hearing aids, hair pieces and dentures. Also wearable glucose monitoring systems (ie. Freestyle Libre and Omnipods) (Braces and fillings normally are not a problem.)   TEST WILL TAKE APPROXIMATELY 1 HOUR  PLEASE NOTIFY SCHEDULING AT LEAST 24 HOURS IN ADVANCE IF YOU ARE UNABLE TO KEEP YOUR APPOINTMENT. [phone]  Please call Rockwell Alexandria, cardiac imaging nurse navigator with any questions/concerns. Rockwell Alexandria RN Navigator Cardiac Imaging Larey Brick RN Navigator Cardiac Imaging [facility] [phone] Office      Follow-Up: At [facility], you and your health needs are our priority.  As part of our continuing mission to provide you with exceptional heart care, we have created designated Provider Care Teams.  These Care Teams include your primary Cardiologist (physician)  and Advanced Practice Providers (APPs -  Physician Assistants and Nurse Practitioners) who all work together to provide you with the care you need, when you need it.    Your next appointment:   6 month(s)  Provider:   Thomasene Ripple, DO

## 2022-12-15 ENCOUNTER — Telehealth: Payer: Self-pay | Admitting: Cardiology

## 2022-12-15 ENCOUNTER — Telehealth: Payer: Self-pay

## 2022-12-15 NOTE — Telephone Encounter (Signed)
Returned call to centralized scheduling-message sent to Luis Wilkinson to return call in regards to CMR

## 2022-12-15 NOTE — Telephone Encounter (Signed)
Received call from Luis Wilkinson-patient did not want to schedule CMR at this time and advised he was going to get a second opinion.    She will cancel order.    Will make MD and nurse aware.

## 2022-12-15 NOTE — Telephone Encounter (Signed)
Called pt to let him know not to get the Ativan prescription filled until the MRI is scheduled. Pt verbalized understanding.

## 2022-12-15 NOTE — Telephone Encounter (Signed)
Caller wants call back regarding   MR CARDIAC MORPHOLOGY W WO CONTRAST  test ordered.

## 2022-12-15 NOTE — Telephone Encounter (Signed)
Called pt. See chart.  

## 2022-12-15 NOTE — Progress Notes (Signed)
Cardiology Office Note:    Date:  12/15/2022   ID:  Luis Wilkinson, DOB [birth year], MRN [id number]  PCP:  Corwin Levins, MD  Cardiologist:  Thomasene Ripple, DO  Electrophysiologist:  None   Referring MD: Corwin Levins, MD   " I am having elevated blood pressure home"  History of Present Illness:    Luis Wilkinson is a 67 y.o. male with a hx of right and left heart catheterization) with no evidence of coronary artery disease and normal right-sided pressures, ascending aortic aneurysm 43 mm on imaging done on January 16, 2021 reported to be unchanged from his previous study which was done in December 2021     I did see the patient back in December for a operative clearance for hand surgery.  We will repeat an echocardiogram and a CTA given his history of ascending thoracic aneurysm.  I also referred the patient to vascular surgery.  He was intermittently cleared for his hand surgery.  He was able to get his hand surgery.  He also has seen vascular surgeon.   I saw the patient in March 2022 at that time he appeared to be doing well from a cardiovascular standpoint no changes were made to his medication regimen.  He did have some shortness of breath for recommended patient get a PFT to make sure secondary lung pathology was not playing a role as his echocardiogram was normal.   He was seen on January 30, 2021 giving the worsening of symptoms are very out of proportion with his shortness of breath I recommended patient undergo left and right heart catheterization.  At that time I also place a ZIO monitor on the patient.   At his visit on March 18, 2021 his flecainide had been increased prior to that visit by EP.  He was doing well.  I saw the patient on 09/18/2021 at that time he was status post colonoscopy. His blood pressure was elevated at that time therefore I restart his HCTz. He was tolerating other medication.   He was send to pulmonary but  was told that his symptoms are not explained by any pulmonary  conditions. He is understandably frustrated.   At his visit on 04/17/2022 I ordered a stress echo to look for any dynamic outflow tract gradient that maybe leading to his symptoms as he only experiences the sob on exertion.   He had delay the stress echo due to physical limitations for tolerating the treadmill.   He was able to the stress test done  on 10/27/2022 his stress echo did not show significant LVOT gradient.   He contacted the office via my wanting to be worked up for HCM due to a TV commercial that suggest his symptoms.   He tells me he is still short of breath.   Past Medical History:  Diagnosis Date   Acetabular labrum tear 10/10/2019   Acute sinusitis 02/22/2020   Allergic rhinitis 12/18/2019   Anemia 07/03/2020   Aneurysm, ascending aorta (HCC)    followed br Dr Priscille Kluver   Ascending aortic aneurysm (HCC) 06/11/2015   Body mass index (BMI) 27.0-27.9, adult 03/11/2020   Cancer (HCC)    Basal cell removed from top of left hand and squamous cell will be removed from top of left hand in January of 2024   Chest pain of uncertain etiology 01/24/2021   Chronic low back pain 02/22/2020   Colonic mass 10/26/2019   DOE (dyspnea on exertion) 03/16/2019   Onset early June  2020 with assoc chest tightness  - CTa 02/23/19 several segmental and subsegmental PE with micronodular lung dz ? Etiology with no venous dopplers  - 03/16/2019   Walked RA  2 laps @  approx 216ft each @ nl pace  stopped due to  End of study with some sob and chest tightness with sats 98%   - trial off acei and max rx for gerd 03/16/2019  - Echo 03/21/2019   No PH  - 03/30/2019   Walked    Droopy eyelid, left 12/18/2019   Dysphagia 01/02/2021   Eosinophilia 03/31/2019   Onset of asthma as child / [facility] eval around 2015    Essential (primary) hypertension 06/11/2015   Change from ACE inhibitor to ARB 03/16/2019 due to unexplained dry cough and chest tightness > completely resolved as of 05/23/2019    GERD  (gastroesophageal reflux disease)    Ground glass opacity present on imaging of lung 01/02/2021   Headache    history of migraines, but haven't had one in several years   History of hiatal hernia    History of kidney stones    History of partial colectomy 12/18/2019   History of prosthetic unicompartmental arthroplasty of right knee 03/02/2016   Hyperglycemia 07/04/2020   Hypertension    Insomnia 12/18/2019   Localized, primary osteoarthritis of hand 02/03/2021   Mass 05/04/2017   Nasal sore 12/18/2019   Nuclear sclerotic cataract of left eye 04/21/2016   Other chronic pain 03/12/2020   Other paralytic strabismus, 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   Overweight 02/03/2021   Pain in joint of right hip 09/22/2018   Pain in limb 02/03/2021   Pain in right knee 02/03/2021   Palpitations 06/11/2015   Presence of right artificial knee joint 03/02/2016   Primary osteoarthritis 02/03/2021   Pulmonary emboli (HCC) 05/24/2019     CTa 02/23/19 pos for PE 02/23/19  Venous dopplers not done, echo ok    RA (rheumatoid arthritis) (HCC)    Rheumatoid arthritis (HCC) 02/03/2021   S/P right unicompartmental knee replacement    06-20-2015  post dislocating plastic    Seronegative rheumatoid arthritis (HCC) 12/18/2019   Status post right unicompartmental knee replacement 03/03/2016   SVT (supraventricular tachycardia)    Transient neurological symptoms 03/12/2020   Urticaria 02/03/2021   Vitamin D deficiency 02/03/2021    Past Surgical History:  Procedure Laterality Date   CARDIAC CATHETERIZATION  Feb 2015    [place]   per pt normal coronary arteries   CATARACT EXTRACTION W/ INTRAOCULAR LENS IMPLANT Bilateral 10/08/2012   CYSTOSCOPY W/ URETEROSCOPY W/ LITHOTRIPSY     EXCISION SUBDERMAL NECK TUMOR  08/10/2008   benign   KNEE ARTHROSCOPY W/ MENISCECTOMY Right 01/09/2015   LEFT ELBOW RECONSTRUCTION  08/10/2008   PARTIAL COLECTOMY  2019   PARTIAL KNEE ARTHROPLASTY Right 08/08/2015   Procedure: RIGHT  UNICOMPARTMENTAL KNEE REVISION PLASTIC;  Surgeon: Durene Romans, MD;  Location: [facility];  Service: Orthopedics;  Laterality: Right;   PARTIAL KNEE ARTHROPLASTY Right 03/02/2016   Procedure: UNICOMPARTMENTAL RIGHT KNEE REVISION;  Surgeon: Durene Romans, MD;  Location: [facility];  Service: Orthopedics;  Laterality: Right;   REPLACEMENT UNICONDYLAR JOINT KNEE Right 06/20/2015   RIGHT/LEFT HEART CATH AND CORONARY ANGIOGRAPHY N/A 01/30/2021   Procedure: RIGHT/LEFT HEART CATH AND CORONARY ANGIOGRAPHY;  Surgeon: Lyn Records, MD;  Location: [facility];  Service: Cardiovascular;  Laterality: N/A;   STRABISMUS SURGERY Left x6   last one --Age 20   TONSILLECTOMY  removed as a child   TRANSFORAMINAL LUMBAR INTERBODY FUSION (TLIF) WITH PEDICLE SCREW FIXATION 1 LEVEL N/A 06/25/2022   Procedure: TRANSFORAMINAL LUMBAR INTERBODY FUSION LUMBAR FOUR TO FIVE;  Surgeon: Venita Lick, MD;  Location: [facility];  Service: Orthopedics;  Laterality: N/A;  4 hrs 3 C-Bed    Current Medications: Current Meds  Medication Sig   albuterol (VENTOLIN HFA) 108 (90 Base) MCG/ACT inhaler INHALE 2 PUFFS INTO THE LUNGS EVERY 6 (SIX) HOURS AS NEEDED FOR WHEEZING OR SHORTNESS OF BREATH.   amoxicillin (AMOXIL) 500 MG capsule TAKE 4 CAPS BY MOUTH 1 HOUR PRIOR TO DENTAL APPOINTMENT   atorvastatin (LIPITOR) 10 MG tablet TAKE 1 TABLET DAILY   DULoxetine (CYMBALTA) 60 MG capsule TAKE 1 CAPSULE DAILY   famotidine (PEPCID) 20 MG tablet Take 1 tablet (20 mg total) by mouth in the morning.   flecainide (TAMBOCOR) 50 MG tablet TAKE 1 TABLET TWICE A DAY   Glucosamine-Chondroit-Vit C-Mn (GLUCOSAMINE 1500 COMPLEX) CAPS Take 1 capsule by mouth in the morning.   hydrochlorothiazide (MICROZIDE) 12.5 MG capsule Take 1 capsule (12.5 mg total) by mouth daily.   leflunomide (ARAVA) 20 MG tablet Take 20 mg by mouth in the morning.   loratadine-pseudoephedrine (CLARITIN-D 24-HOUR) 10-240 MG 24 hr tablet Take 1 tablet by mouth  in the morning.   LORazepam (ATIVAN) 1 MG tablet Take upon arrival to MRI appointment. Must have a driver.   metoprolol succinate (TOPROL-XL) 100 MG 24 hr tablet TAKE 1 TABLET DAILY WITH ORIMMEDIATELY FOLLOWING A    MEAL ( DOSE INCREASE )   predniSONE (DELTASONE) 5 MG tablet TAKE 1 TABLET ONCE DAILY for 90   RESTASIS 0.05 % ophthalmic emulsion Place 1 drop into both eyes 2 (two) times daily as needed (dry/irritated eyes.).   temazepam (RESTORIL) 15 MG capsule TAKE 1 CAPSULE AT BEDTIME  AS NEEDED FOR SLEEP   verapamil (CALAN-SR) 180 MG CR tablet Take 1 tablet (180 mg total) by mouth in the morning and at bedtime.   [DISCONTINUED] amLODipine (NORVASC) 5 MG tablet Take 1 tablet (5 mg total) by mouth daily.   [DISCONTINUED] ondansetron (ZOFRAN) 4 MG tablet Take 1 tablet (4 mg total) by mouth every 8 (eight) hours as needed for nausea or vomiting.   [DISCONTINUED] valsartan (DIOVAN) 160 MG tablet Take 1 tablet (160 mg total) by mouth 2 (two) times daily.   [DISCONTINUED] verapamil (CALAN-SR) 180 MG CR tablet Take 1 tablet (180 mg total) by mouth 2 (two) times daily.     Allergies:   Dilaudid [hydromorphone], Morphine, Plaquenil [hydroxychloroquine], Infliximab, and Ciprofloxacin   Social History   Socioeconomic History   Marital status: Married    Spouse name: Not on file   Number of children: 2   Years of education: Not on file   Highest education level: Not on file  Occupational History   Not on file  Tobacco Use   Smoking status: Former    Packs/day: 0.50    Years: 10.00    Additional pack years: 0.00    Total pack years: 5.00    Types: Cigarettes    Start date: 63    Quit date: 07/19/1994    Years since quitting: 28.4   Smokeless tobacco: Never  Vaping Use   Vaping Use: Never used  Substance and Sexual Activity   Alcohol use: No   Drug use: No   Sexual activity: Not on file  Other Topics Concern   Not on file  Social History Narrative   Not  on file   Social  Determinants of Health   Financial Resource Strain: Low Risk  (09/04/2022)   Overall Financial Resource Strain (CARDIA)    Difficulty of Paying Living Expenses: Not hard at all  Food Insecurity: No Food Insecurity (09/04/2022)   Hunger Vital Sign    Worried About Running Out of Food in the Last Year: Never true    Ran Out of Food in the Last Year: Never true  Transportation Needs: No Transportation Needs (09/04/2022)   PRAPARE - Administrator, Civil Service (Medical): No    Lack of Transportation (Non-Medical): No  Physical Activity: Inactive (09/04/2022)   Exercise Vital Sign    Days of Exercise per Week: 0 days    Minutes of Exercise per Session: 0 min  Stress: No Stress Concern Present (09/04/2022)   [employer] of Occupational Health - Occupational Stress Questionnaire    Feeling of Stress : Not at all  Social Connections: Socially Integrated (09/04/2022)   Social Connection and Isolation Panel [NHANES]    Frequency of Communication with Friends and Family: More than three times a week    Frequency of Social Gatherings with Friends and Family: More than three times a week    Attends Religious Services: More than 4 times per year    Active Member of [employer] or Organizations: Yes    Attends Engineer, structural: More than 4 times per year    Marital Status: Married     Family History: The patient's family history includes Congestive Heart Failure in his father and mother.  ROS:   Review of Systems  Constitution: Negative for decreased appetite, fever and weight gain.  HENT: Negative for congestion, ear discharge, hoarse voice and sore throat.   Eyes: Negative for discharge, redness, vision loss in right eye and visual halos.  Cardiovascular: Negative for chest pain, dyspnea on exertion, leg swelling, orthopnea and palpitations.  Respiratory: Negative for cough, hemoptysis, shortness of breath and snoring.   Endocrine: Negative for heat intolerance and  polyphagia.  Hematologic/Lymphatic: Negative for bleeding problem. Does not bruise/bleed easily.  Skin: Negative for flushing, nail changes, rash and suspicious lesions.  Musculoskeletal: Negative for arthritis, joint pain, muscle cramps, myalgias, neck pain and stiffness.  Gastrointestinal: Negative for abdominal pain, bowel incontinence, diarrhea and excessive appetite.  Genitourinary: Negative for decreased libido, genital sores and incomplete emptying.  Neurological: Negative for brief paralysis, focal weakness, headaches and loss of balance.  Psychiatric/Behavioral: Negative for altered mental status, depression and suicidal ideas.  Allergic/Immunologic: Negative for HIV exposure and persistent infections.    EKGs/Labs/Other Studies Reviewed:    The following studies were reviewed today:   EKG:  None today  Stress echo 10/27/2022 atient Performance: The patient exercised for 5 minutes and 25 seconds,  achieving 7 METS. The maximum stage achieved was II of the Bruce protocol.  The heart rate at peak stress was 141 bpm. The target heart rate was  calculated to be 131 bpm. The  percentage of maximum predicted heart rate achieved was 91.6 %. The  baseline blood pressure was 129/85 mmHg. The blood pressure at peak stress  was 166/71 mmHg. The patient developed shortness of breath during the  stress exam.    EKG: The patient developed no abnormal EKG findings during exercise.    Stress Doppler:    LVOT: The baseline LVOT gradient was 4 mmHG. The peak LVOT gradient at  stress was 12 mmHG.     [facility]  Electronically  signed on 10/28/2022 at 8:28:41 AM     [facility] 01/30/2021 Right dominant normal coronary arteries. Normal left ventricular systolic function with EF greater than 55%.  LVEDP 14 mmHg. Normal right heart pressures with mean wedge pressure 11 mmHg.   RECOMMENDATIONS:   Exertional dyspnea does not appear to be related to pulmonary hypertension from prior pulmonary  emboli, systolic or diastolic heart failure, and/or coronary artery disease with myocardial ischemia. Consider cardiopulmonary function testing. 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  TTE 01/22/2021 IMPRESSIONS   1. Left ventricular ejection fraction, by estimation, is 60 to 65%. The  left ventricle has normal function. The left ventricle has no regional  wall motion abnormalities. Left ventricular diastolic parameters are  indeterminate.   2. Right ventricular systolic function is normal. The right ventricular  size is normal. There is normal pulmonary artery systolic pressure.   3. The mitral valve is normal in structure. No evidence of mitral valve  regurgitation. No evidence of mitral stenosis.   4. The aortic valve is normal in structure. Aortic valve regurgitation is  mild. No aortic stenosis is present.   5. Aneurysm of the ascending aorta, measuring 44 mm.   6. The inferior vena cava is normal in size with greater than 50%  respiratory variability, suggesting right atrial pressure of 3 mmHg.   FINDINGS   Left Ventricle: Left ventricular ejection fraction, by estimation, is 60  to 65%. The left ventricle has normal function. The left ventricle has no  regional wall motion abnormalities. The left ventricular internal cavity  size was normal in size. There is   borderline left ventricular hypertrophy. Left ventricular diastolic  parameters are indeterminate.   Right Ventricle: The right ventricular size is normal. No increase in  right ventricular wall thickness. Right ventricular systolic function is  normal. There is normal pulmonary artery systolic pressure. The tricuspid  regurgitant velocity is 2.02 m/s, and   with an assumed right atrial pressure of 3 mmHg, the estimated right  ventricular systolic pressure is 19.3 mmHg.   Left Atrium: Left atrial size was normal in size.   Right Atrium: Right atrial size was normal in size.   Pericardium: There is no evidence of  pericardial effusion.   Mitral Valve: The mitral valve is normal in structure. No evidence of  mitral valve regurgitation. No evidence of mitral valve stenosis.   Tricuspid Valve: The tricuspid valve is normal in structure. Tricuspid  valve regurgitation is not demonstrated. No evidence of tricuspid  stenosis.   Aortic Valve: The aortic valve is normal in structure. Aortic valve  regurgitation is mild. No aortic stenosis is present.   Pulmonic Valve: The pulmonic valve was normal in structure. Pulmonic valve  regurgitation is not visualized. No evidence of pulmonic stenosis.   Aorta: The aortic root is normal in size and structure. There is an  aneurysm involving the ascending aorta measuring 44 mm.   Venous: The inferior vena cava is normal in size with greater than 50%  respiratory variability, suggesting right atrial pressure of 3 mmHg.   IAS/Shunts: No atrial level shunt detected by color flow Doppler.       Recent Labs: 09/10/2022: ALT 13; BUN 31; Creatinine, Ser 1.45; Hemoglobin 12.2; Platelets 243.0; Potassium 4.7; Sodium 138; TSH 0.51  Recent Lipid Panel    Component Value Date/Time   CHOL 180 09/10/2022 1546   TRIG 151.0 (H) 09/10/2022 1546   HDL 51.80 09/10/2022 1546   CHOLHDL 3 09/10/2022 1546   VLDL 30.2 09/10/2022  1546   LDLCALC 98 09/10/2022 1546   LDLDIRECT 160.0 08/18/2021 1641    Physical Exam:    VS:  BP 122/74   Pulse 70   Ht 5\' 10"  (1.778 m)   Wt 183 lb (83 kg)   SpO2 96%   BMI 26.26 kg/m     Wt Readings from Last 3 Encounters:  12/14/22 183 lb (83 kg)  09/10/22 189 lb (85.7 kg)  09/04/22 190 lb (86.2 kg)     GEN: Well nourished, well developed in no acute distress HEENT: Normal NECK: No JVD; No carotid bruits LYMPHATICS: No lymphadenopathy CARDIAC: S1S2 noted,RRR, no murmurs, rubs, gallops RESPIRATORY:  Clear to auscultation without rales, wheezing or rhonchi  ABDOMEN: Soft, non-tender, non-distended, +bowel sounds, no  guarding. EXTREMITIES: No edema, No cyanosis, no clubbing MUSCULOSKELETAL:  No deformity  SKIN: Warm and dry NEUROLOGIC:  Alert and oriented x 3, non-focal PSYCHIATRIC:  Normal affect, good insight  ASSESSMENT:    1. Medication management   2. Daytime somnolence   3. Snoring   4. DOE (dyspnea on exertion)   5. Essential (primary) hypertension     PLAN:    He continues to be dyspneic on exertion and this is affecting his is affecting his quality of life. He has had normal Right and left heart cath, normal PFT, TTE in 2022 was normal yet symptoms on exertion persist now worsened.   He recently had a stress echo completed which I ordered to assess for LVOT gradient with stress to understand if HCM may be playing a role. With is inquiry today I explained that I have thought along the line of increase LVOT exercise gradient to rule out HCM - this was normal.   He is concerned. I keeping with the suspicion of HCM. Will stop the ARB. Will start Verapimil. We may be able to cut back on his diuretics. In the meantime will get CMR to assess for Gadolinium enhancements.   Considering other sources of shortness of breath - he also snores with daytime somnolence and fatigue therefore I will like to rule out sleep apnea - will order a sleep study.   If both the above turns out normal - can consider a V/Q scan to rule out chronic thromboembolism as he does have a history of pulmonary embolism. Then likely consideration for TEE for ruling out structural defect if platypnea-orthodeoxia syndrome.   Blood pressure is acceptable.   Review imaging CTA chest aortic dilatation is stable.  Would perhaps benefit from reevaluation from pulmonary but for right now he does not show further interest for reevaluations.   He seems unsatisfied with the plan - but have no other questions at this time.  Medication Adjustments/Labs and Tests Ordered: Current medicines are reviewed at length with the patient today.   Concerns regarding medicines are outlined above.  Orders Placed This Encounter  Procedures   Flecainide level   Split night study   Meds ordered this encounter  Medications   DISCONTD: verapamil (CALAN-SR) 180 MG CR tablet    Sig: Take 1 tablet (180 mg total) by mouth 2 (two) times daily.    Dispense:  180 tablet    Refill:  3   LORazepam (ATIVAN) 1 MG tablet    Sig: Take upon arrival to MRI appointment. Must have a driver.    Dispense:  1 tablet    Refill:  0   verapamil (CALAN-SR) 180 MG CR tablet    Sig: Take 1 tablet (180 mg total)  by mouth 2 (two) times daily.    Dispense:  60 tablet    Refill:  2   verapamil (CALAN-SR) 180 MG CR tablet    Sig: Take 1 tablet (180 mg total) by mouth in the morning and at bedtime.    Dispense:  180 tablet    Refill:  3    Patient Instructions  Medication Instructions:  Your physician has recommended you make the following change in your medication:  STOP: Amlodipine STOP: Valsartan START: Verapamil 180 mg twice daily    *If you need a refill on your cardiac medications before your next appointment, please call your pharmacy*   Lab Work: Your physician recommends that you return to have labs in 2 weeks: Flecainide  If you have labs (blood work) drawn today and your tests are completely normal, you will receive your results only by: MyChart Message (if you have MyChart) OR A paper copy in the mail If you have any lab test that is abnormal or we need to change your treatment, we will call you to review the results.   Testing/Procedures: Your physician has recommended that you have a sleep study. This test records several body functions during sleep, including: brain activity, eye movement, oxygen and carbon dioxide blood levels, heart rate and rhythm, breathing rate and rhythm, the flow of air through your mouth and nose, snoring, body muscle movements, and chest and belly movement.    You will be scheduled for a Cardiac MRI Please  arrive for your appointment arrive 30-45 minutes prior to test start time.? You have been prescribed Ativan 1 mg to take upon arrival to your MRI appointment. You must have a driver the day of this test since you have been prescribed Ativan.  Please hold Temazepam (Restoril) day of your procedure.   [facility] [address] [place], [place] [address] [phone] Please take advantage of the free valet parking available at the MAIN entrance (A entrance).  Proceed to the [facility] Radiology Department (First Floor) for check-in.    Magnetic resonance imaging (MRI) is a painless test that produces images of the inside of the body without using Xrays.  During an MRI, strong magnets and radio waves work together in a Data processing manager to form detailed images.   MRI images may provide more details about a medical condition than X-rays, CT scans, and ultrasounds can provide.  You may be given earphones to listen for instructions.  You may eat a light breakfast and take medications as ordered with the exception of furosemide, hydrochlorothiazide, or spironolactone(fluid pill, other). Please avoid stimulants for 12 hr prior to test. (Ie. Caffeine, nicotine, chocolate, or antihistamine medications)  If a contrast material will be used, an IV will be inserted into one of your veins. Contrast material will be injected into your IV. It will leave your body through your urine within a day. You may be told to drink plenty of fluids to help flush the contrast material out of your system.  You will be asked to remove all metal, including: Watch, jewelry, and other metal objects including hearing aids, hair pieces and dentures. Also wearable glucose monitoring systems (ie. Freestyle Libre and Omnipods) (Braces and fillings normally are not a problem.)   TEST WILL TAKE APPROXIMATELY 1 HOUR  PLEASE NOTIFY SCHEDULING AT LEAST 24 HOURS IN ADVANCE IF YOU ARE UNABLE TO KEEP YOUR  APPOINTMENT. [phone]  Please call Rockwell Alexandria, cardiac imaging nurse navigator with any questions/concerns. Rockwell Alexandria RN  Navigator Cardiac Imaging Larey Brick RN Navigator Cardiac Imaging [facility] [phone] Office      Follow-Up: At [facility], you and your health needs are our priority.  As part of our continuing mission to provide you with exceptional heart care, we have created designated Provider Care Teams.  These Care Teams include your primary Cardiologist (physician) and Advanced Practice Providers (APPs -  Physician Assistants and Nurse Practitioners) who all work together to provide you with the care you need, when you need it.    Your next appointment:   6 month(s)  Provider:   Thomasene Ripple, DO     Adopting a Healthy Lifestyle.  Know what a healthy weight is for you (roughly BMI <25) and aim to maintain this   Aim for 7+ servings of fruits and vegetables daily   65-80+ fluid ounces of water or unsweet tea for healthy kidneys   Limit to max 1 drink of alcohol per day; avoid smoking/tobacco   Limit animal fats in diet for cholesterol and heart health - choose grass fed whenever available   Avoid highly processed foods, and foods high in saturated/trans fats   Aim for low stress - take time to unwind and care for your mental health   Aim for 150 min of moderate intensity exercise weekly for heart health, and weights twice weekly for bone health   Aim for 7-9 hours of sleep daily   When it comes to diets, agreement about the perfect plan isnt easy to find, even among the experts. Experts at the [facility] [employer] developed an idea known as the Healthy Eating Plate. Just imagine a plate divided into logical, healthy portions.   The emphasis is on diet quality:   Load up on vegetables and fruits - one-half of your plate: Aim for color and variety, and remember that potatoes dont count.   Go  for whole grains - one-quarter of your plate: Whole wheat, barley, wheat berries, quinoa, oats, brown rice, and foods made with them. If you want pasta, go with whole wheat pasta.   Protein power - one-quarter of your plate: Fish, chicken, beans, and nuts are all healthy, versatile protein sources. Limit red meat.   The diet, however, does go beyond the plate, offering a few other suggestions.   Use healthy plant oils, such as olive, canola, soy, corn, sunflower and peanut. Check the labels, and avoid partially hydrogenated oil, which have unhealthy trans fats.   If youre thirsty, drink water. Coffee and tea are good in moderation, but skip sugary drinks and limit milk and dairy products to one or two daily servings.   The type of carbohydrate in the diet is more important than the amount. Some sources of carbohydrates, such as vegetables, fruits, whole grains, and beans-are healthier than others.   Finally, stay active  Signed, Thomasene Ripple, DO  12/15/2022 8:37 PM    [facility]

## 2022-12-20 ENCOUNTER — Other Ambulatory Visit: Payer: Self-pay | Admitting: Internal Medicine

## 2023-01-15 ENCOUNTER — Other Ambulatory Visit: Payer: Self-pay | Admitting: Cardiothoracic Surgery

## 2023-01-15 DIAGNOSIS — I7121 Aneurysm of the ascending aorta, without rupture: Secondary | ICD-10-CM

## 2023-02-02 NOTE — Addendum Note (Signed)
Addended by: Brunetta Genera on: 02/02/2023 04:05 PM   Modules accepted: Orders

## 2023-02-25 ENCOUNTER — Telehealth (HOSPITAL_BASED_OUTPATIENT_CLINIC_OR_DEPARTMENT_OTHER): Payer: Self-pay

## 2023-02-25 NOTE — Telephone Encounter (Signed)
Noted order for split night sleep study.  Attempted to contact patient to see if he wants [place] to schedule sleep study.  No answer, or answering machine.  Will work on Counselling psychologist for study. Jim Like MHA RN CCM

## 2023-03-01 ENCOUNTER — Other Ambulatory Visit: Payer: Medicare Other

## 2023-03-01 ENCOUNTER — Ambulatory Visit: Payer: Medicare Other | Admitting: Cardiothoracic Surgery

## 2023-03-04 ENCOUNTER — Other Ambulatory Visit (HOSPITAL_COMMUNITY): Payer: Medicare Other

## 2023-03-07 ENCOUNTER — Other Ambulatory Visit: Payer: Self-pay | Admitting: Cardiology

## 2023-03-10 ENCOUNTER — Other Ambulatory Visit: Payer: Self-pay | Admitting: Cardiology

## 2023-03-11 ENCOUNTER — Encounter: Payer: Self-pay | Admitting: Internal Medicine

## 2023-03-11 ENCOUNTER — Ambulatory Visit: Payer: Medicare Other | Admitting: Internal Medicine

## 2023-03-11 VITALS — BP 120/60 | HR 63 | Temp 97.8°F | Ht 70.0 in | Wt 177.0 lb

## 2023-03-11 DIAGNOSIS — R739 Hyperglycemia, unspecified: Secondary | ICD-10-CM | POA: Diagnosis not present

## 2023-03-11 DIAGNOSIS — E559 Vitamin D deficiency, unspecified: Secondary | ICD-10-CM | POA: Diagnosis not present

## 2023-03-11 DIAGNOSIS — I10 Essential (primary) hypertension: Secondary | ICD-10-CM | POA: Diagnosis not present

## 2023-03-11 DIAGNOSIS — G4700 Insomnia, unspecified: Secondary | ICD-10-CM

## 2023-03-11 MED ORDER — TEMAZEPAM 15 MG PO CAPS: ORAL_CAPSULE | ORAL | 1 refills | Status: DC

## 2023-03-11 NOTE — Progress Notes (Signed)
Patient ID: Luis Wilkinson, male   DOB: [birth year], 67 y.o.   MRN: [id number]        Chief Complaint: follow up insomnia, hyperglycemia, low vit d, htn       HPI:  Luis Wilkinson is a 67 y.o. male here with c/o recurring insomnia, temazepam helped before, now out.  Denies worsening depressive symptoms, suicidal ideation, or panic  Pt denies chest pain, increased sob or doe, wheezing, orthopnea, PND, increased LE swelling, palpitations, dizziness or syncope.   Pt denies polydipsia, polyuria, or new focal neuro s/s.   Lost 12 lbs with better diet.  Stopped the vit d per rheum due to worsening cr.  Had flecainide stopped per cardiology.   Wt Readings from Last 3 Encounters:  03/11/23 177 lb (80.3 kg)  12/14/22 183 lb (83 kg)  09/10/22 189 lb (85.7 kg)   BP Readings from Last 3 Encounters:  03/11/23 120/60  12/14/22 122/74  09/10/22 136/84         Past Medical History:  Diagnosis Date   Acetabular labrum tear 10/10/2019   Acute sinusitis 02/22/2020   Allergic rhinitis 12/18/2019   Anemia 07/03/2020   Aneurysm, ascending aorta (HCC)    followed br Dr Priscille Kluver   Ascending aortic aneurysm (HCC) 06/11/2015   Body mass index (BMI) 27.0-27.9, adult 03/11/2020   Cancer (HCC)    Basal cell removed from top of left hand and squamous cell will be removed from top of left hand in January of 2024   Chest pain of uncertain etiology 01/24/2021   Chronic low back pain 02/22/2020   Colonic mass 10/26/2019   DOE (dyspnea on exertion) 03/16/2019   Onset early June 2020 with assoc chest tightness  - CTa 02/23/19 several segmental and subsegmental PE with micronodular lung dz ? Etiology with no venous dopplers  - 03/16/2019   Walked RA  2 laps @  approx 260ft each @ nl pace  stopped due to  End of study with some sob and chest tightness with sats 98%   - trial off acei and max rx for gerd 03/16/2019  - Echo 03/21/2019   No PH  - 03/30/2019   Walked    Droopy eyelid, left 12/18/2019   Dysphagia 01/02/2021    Eosinophilia 03/31/2019   Onset of asthma as child / [facility] eval around 2015    Essential (primary) hypertension 06/11/2015   Change from ACE inhibitor to ARB 03/16/2019 due to unexplained dry cough and chest tightness > completely resolved as of 05/23/2019    GERD (gastroesophageal reflux disease)    Ground glass opacity present on imaging of lung 01/02/2021   Headache    history of migraines, but haven't had one in several years   History of hiatal hernia    History of kidney stones    History of partial colectomy 12/18/2019   History of prosthetic unicompartmental arthroplasty of right knee 03/02/2016   Hyperglycemia 07/04/2020   Hypertension    Insomnia 12/18/2019   Localized, primary osteoarthritis of hand 02/03/2021   Mass 05/04/2017   Nasal sore 12/18/2019   Nuclear sclerotic cataract of left eye 04/21/2016   Other chronic pain 03/12/2020   Other paralytic strabismus, 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   Overweight 02/03/2021   Pain in joint of right hip 09/22/2018   Pain in limb 02/03/2021   Pain in right knee 02/03/2021   Palpitations 06/11/2015   Presence of right artificial knee joint 03/02/2016   Primary osteoarthritis 02/03/2021   Pulmonary emboli (HCC)  05/24/2019     CTa 02/23/19 pos for PE 02/23/19  Venous dopplers not done, echo ok    RA (rheumatoid arthritis) (HCC)    Rheumatoid arthritis (HCC) 02/03/2021   S/P right unicompartmental knee replacement    06-20-2015  post dislocating plastic    Seronegative rheumatoid arthritis (HCC) 12/18/2019   Status post right unicompartmental knee replacement 03/03/2016   SVT (supraventricular tachycardia)    Transient neurological symptoms 03/12/2020   Urticaria 02/03/2021   Vitamin D deficiency 02/03/2021   Past Surgical History:  Procedure Laterality Date   CARDIAC CATHETERIZATION  Feb 2015    [place]   per pt normal coronary arteries   CATARACT EXTRACTION W/ INTRAOCULAR LENS IMPLANT Bilateral 10/08/2012   CYSTOSCOPY W/  URETEROSCOPY W/ LITHOTRIPSY     EXCISION SUBDERMAL NECK TUMOR  08/10/2008   benign   KNEE ARTHROSCOPY W/ MENISCECTOMY Right 01/09/2015   LEFT ELBOW RECONSTRUCTION  08/10/2008   PARTIAL COLECTOMY  2019   PARTIAL KNEE ARTHROPLASTY Right 08/08/2015   Procedure: RIGHT UNICOMPARTMENTAL KNEE REVISION PLASTIC;  Surgeon: Durene Romans, MD;  Location: [facility];  Service: Orthopedics;  Laterality: Right;   PARTIAL KNEE ARTHROPLASTY Right 03/02/2016   Procedure: UNICOMPARTMENTAL RIGHT KNEE REVISION;  Surgeon: Durene Romans, MD;  Location: [facility];  Service: Orthopedics;  Laterality: Right;   REPLACEMENT UNICONDYLAR JOINT KNEE Right 06/20/2015   RIGHT/LEFT HEART CATH AND CORONARY ANGIOGRAPHY N/A 01/30/2021   Procedure: RIGHT/LEFT HEART CATH AND CORONARY ANGIOGRAPHY;  Surgeon: Lyn Records, MD;  Location: [facility];  Service: Cardiovascular;  Laterality: N/A;   STRABISMUS SURGERY Left x6   last one --Age 89   TONSILLECTOMY     removed as a child   TRANSFORAMINAL LUMBAR INTERBODY FUSION (TLIF) WITH PEDICLE SCREW FIXATION 1 LEVEL N/A 06/25/2022   Procedure: TRANSFORAMINAL LUMBAR INTERBODY FUSION LUMBAR FOUR TO FIVE;  Surgeon: Venita Lick, MD;  Location: [facility];  Service: Orthopedics;  Laterality: N/A;  4 hrs 3 C-Bed    reports that he quit smoking about 28 years ago. His smoking use included cigarettes. He started smoking about 52 years ago. He has a 12 pack-year smoking history. He has never used smokeless tobacco. He reports that he does not drink alcohol and does not use drugs. family history includes Congestive Heart Failure in his father and mother. Allergies  Allergen Reactions   Dilaudid [Hydromorphone] Shortness Of Breath and Other (See Comments)    Chest tight   Morphine Shortness Of Breath    SEVERE   Plaquenil [Hydroxychloroquine] Itching, Swelling and Rash   Infliximab Rash and Other (See Comments)    (REMICADE)SEVERE   Ciprofloxacin Other (See Comments)     Thoracic aortic aneurysm   Current Outpatient Medications on File Prior to Visit  Medication Sig Dispense Refill   albuterol (VENTOLIN HFA) 108 (90 Base) MCG/ACT inhaler INHALE 2 PUFFS INTO THE LUNGS EVERY 6 (SIX) HOURS AS NEEDED FOR WHEEZING OR SHORTNESS OF BREATH. 18 g 5   amoxicillin (AMOXIL) 500 MG capsule TAKE 4 CAPS BY MOUTH 1 HOUR PRIOR TO DENTAL APPOINTMENT     atorvastatin (LIPITOR) 10 MG tablet TAKE 1 TABLET DAILY 90 tablet 3   DULoxetine (CYMBALTA) 60 MG capsule TAKE 1 CAPSULE DAILY 90 capsule 2   famotidine (PEPCID) 20 MG tablet TAKE 1 TABLET DAILY AFTER  SUPPER 90 tablet 3   Glucosamine-Chondroit-Vit C-Mn (GLUCOSAMINE 1500 COMPLEX) CAPS Take 1 capsule by mouth in the morning.     hydrochlorothiazide (MICROZIDE) 12.5 MG  capsule Take 1 capsule (12.5 mg total) by mouth daily. 90 capsule 3   leflunomide (ARAVA) 20 MG tablet Take 20 mg by mouth in the morning.     loratadine-pseudoephedrine (CLARITIN-D 24-HOUR) 10-240 MG 24 hr tablet Take 1 tablet by mouth in the morning.     LORazepam (ATIVAN) 1 MG tablet Take upon arrival to MRI appointment. Must have a driver. 1 tablet 0   metoprolol succinate (TOPROL-XL) 100 MG 24 hr tablet TAKE 1 TABLET DAILY WITH ORIMMEDIATELY FOLLOWING A    MEAL ( DOSE INCREASE ) 90 tablet 2   predniSONE (DELTASONE) 5 MG tablet TAKE 1 TABLET ONCE DAILY for 90     RESTASIS 0.05 % ophthalmic emulsion Place 1 drop into both eyes 2 (two) times daily as needed (dry/irritated eyes.).     verapamil (CALAN-SR) 180 MG CR tablet Take 1 tablet (180 mg total) by mouth in the morning and at bedtime. 180 tablet 3   No current facility-administered medications on file prior to visit.        ROS:  All others reviewed and negative.  Objective        PE:  BP 120/60 (BP Location: Left Arm, Patient Position: Sitting, Cuff Size: Normal)   Pulse 63   Temp 97.8 F (36.6 C) (Oral)   Ht 5\' 10"  (1.778 m)   Wt 177 lb (80.3 kg)   SpO2 97%   BMI 25.40 kg/m                  Constitutional: Pt appears in NAD               HENT: Head: NCAT.                Right Ear: External ear normal.                 Left Ear: External ear normal.                Eyes: . Pupils are equal, round, and reactive to light. Conjunctivae and EOM are normal               Nose: without d/c or deformity               Neck: Neck supple. Gross normal ROM               Cardiovascular: Normal rate and regular rhythm.                 Pulmonary/Chest: Effort normal and breath sounds without rales or wheezing.                Abd:  Soft, NT, ND, + BS, no organomegaly               Neurological: Pt is alert. At baseline orientation, motor grossly intact               Skin: Skin is warm. No rashes, no other new lesions, LE edema - none               Psychiatric: Pt behavior is normal without agitation   Micro: none  Cardiac tracings I have personally interpreted today:  none  Pertinent Radiological findings (summarize): none   Lab Results  Component Value Date   WBC 7.2 09/10/2022   HGB 12.2 (L) 09/10/2022   HCT 36.4 (L) 09/10/2022   PLT 243.0 09/10/2022   GLUCOSE 110 (H) 09/10/2022   CHOL 180 09/10/2022  TRIG 151.0 (H) 09/10/2022   HDL 51.80 09/10/2022   LDLDIRECT 160.0 08/18/2021   LDLCALC 98 09/10/2022   ALT 13 09/10/2022   AST 17 09/10/2022   NA 138 09/10/2022   K 4.7 09/10/2022   CL 101 09/10/2022   CREATININE 1.45 09/10/2022   BUN 31 (H) 09/10/2022   CO2 30 09/10/2022   TSH 0.51 09/10/2022   PSA 0.68 09/10/2022   HGBA1C 5.6 09/10/2022   Assessment/Plan:  Luis Wilkinson is a 67 y.o. White or Caucasian [1] male with  has a past medical history of Acetabular labrum tear (10/10/2019), Acute sinusitis (02/22/2020), Allergic rhinitis (12/18/2019), Anemia (07/03/2020), Aneurysm, ascending aorta (HCC), Ascending aortic aneurysm (HCC) (06/11/2015), Body mass index (BMI) 27.0-27.9, adult (03/11/2020), Cancer (HCC), Chest pain of uncertain etiology (01/24/2021), Chronic low back pain  (02/22/2020), Colonic mass (10/26/2019), DOE (dyspnea on exertion) (03/16/2019), Droopy eyelid, left (12/18/2019), Dysphagia (01/02/2021), Eosinophilia (03/31/2019), Essential (primary) hypertension (06/11/2015), GERD (gastroesophageal reflux disease), Ground glass opacity present on imaging of lung (01/02/2021), Headache, History of hiatal hernia, History of kidney stones, History of partial colectomy (12/18/2019), History of prosthetic unicompartmental arthroplasty of right knee (03/02/2016), Hyperglycemia (07/04/2020), Hypertension, Insomnia (12/18/2019), Localized, primary osteoarthritis of hand (02/03/2021), Mass (05/04/2017), Nasal sore (12/18/2019), Nuclear sclerotic cataract of left eye (04/21/2016), Other chronic pain (03/12/2020), Other paralytic strabismus, left eye (12/18/2019), Overweight (02/03/2021), Pain in joint of right hip (09/22/2018), Pain in limb (02/03/2021), Pain in right knee (02/03/2021), Palpitations (06/11/2015), Presence of right artificial knee joint (03/02/2016), Primary osteoarthritis (02/03/2021), Pulmonary emboli (HCC) (05/24/2019), RA (rheumatoid arthritis) (HCC), Rheumatoid arthritis (HCC) (02/03/2021), S/P right unicompartmental knee replacement, Seronegative rheumatoid arthritis (HCC) (12/18/2019), Status post right unicompartmental knee replacement (03/03/2016), SVT (supraventricular tachycardia), Transient neurological symptoms (03/12/2020), Urticaria (02/03/2021), and Vitamin D deficiency (02/03/2021).  Essential (primary) hypertension BP Readings from Last 3 Encounters:  03/11/23 120/60  12/14/22 122/74  09/10/22 136/84   Stable, pt to continue medical treatment hct 12.5 every day, toprol xl 100 every day, calan sr 180 bid   Hyperglycemia Lab Results  Component Value Date   HGBA1C 5.6 09/10/2022   Stable, pt to continue current medical treatment  - diet, wt control   Vitamin D deficiency Last vitamin D Lab Results  Component Value Date   VD25OH 31.83  09/10/2022   Low, to hold vit d oral replacement for now per renal, consider calcitriol  Insomnia Mild to mod recurrent, for restart temazepam at bedtime prn  Followup: Return in about 6 months (around 09/11/2023).  Oliver Barre, MD 03/14/2023 9:48 AM [facility]

## 2023-03-11 NOTE — Patient Instructions (Addendum)
Please continue all other medications as before, and refills have been done if requested.  Please have the pharmacy call with any other refills you may need.  Please continue your efforts at being more active, low cholesterol diet, and weight control.  Please keep your appointments with your specialists as you may have planned  We can hold on lab work today  Please make an Appointment to return in 6 months, or sooner if needed

## 2023-03-14 ENCOUNTER — Encounter: Payer: Self-pay | Admitting: Internal Medicine

## 2023-03-14 NOTE — Assessment & Plan Note (Signed)
Lab Results  Component Value Date   HGBA1C 5.6 09/10/2022   Stable, pt to continue current medical treatment  - diet, wt control

## 2023-03-14 NOTE — Assessment & Plan Note (Signed)
BP Readings from Last 3 Encounters:  03/11/23 120/60  12/14/22 122/74  09/10/22 136/84   Stable, pt to continue medical treatment hct 12.5 every day, toprol xl 100 every day, calan sr 180 bid

## 2023-03-14 NOTE — Assessment & Plan Note (Signed)
Last vitamin D Lab Results  Component Value Date   VD25OH 31.83 09/10/2022   Low, to hold vit d oral replacement for now per renal, consider calcitriol

## 2023-03-14 NOTE — Assessment & Plan Note (Signed)
Mild to mod recurrent, for restart temazepam at bedtime prn

## 2023-05-17 ENCOUNTER — Telehealth: Payer: Self-pay

## 2023-05-17 NOTE — Transitions of Care (Post Inpatient/ED Visit) (Signed)
05/17/2023  Name: Kang Brilla MRN: [id number] DOB: 23-Sep-1955  Today's TOC FU Call Status:   Patient's Name and Date of Birth confirmed.  Transition Care Management Follow-up Telephone Call Date of Discharge: 05/14/23 Discharge Facility: Other [facility]) Name of Other (Non[facility]) Discharge Facility: [facility] Type of Discharge: Emergency Department Reason for ED Visit: Renal, Other: Renal Diagnosis:  (AKI) How have you been since you were released from the hospital?: Better Any questions or concerns?: No  Items Reviewed: Did you receive and understand the discharge instructions provided?: Yes Medications obtained,verified, and reconciled?: Yes (Medications Reviewed) Any new allergies since your discharge?: No Dietary orders reviewed?: NA Do you have support at home?: Yes People in Home: spouse  Medications Reviewed Today: Medications Reviewed Today     Reviewed by Marinus Maw, CMA (Certified Medical Assistant) on 05/17/23 at 1510  Med List Status: <None>   Medication Order Taking? Sig Documenting Provider Last Dose Status Informant  albuterol (VENTOLIN HFA) 108 (90 Base) MCG/ACT inhaler [id number] No INHALE 2 PUFFS INTO THE LUNGS EVERY 6 (SIX) HOURS AS NEEDED FOR WHEEZING OR SHORTNESS OF BREATH. Corwin Levins, MD Taking Active Self  amoxicillin (AMOXIL) 500 MG capsule [id number] No TAKE 4 CAPS BY MOUTH 1 HOUR PRIOR TO DENTAL APPOINTMENT [provider] Taking Active   atorvastatin (LIPITOR) 10 MG tablet [id number] No TAKE 1 TABLET DAILY Corwin Levins, MD Taking Active   DULoxetine (CYMBALTA) 60 MG capsule [id number] No TAKE 1 CAPSULE DAILY Corwin Levins, MD Taking Active Self  famotidine (PEPCID) 20 MG tablet [id number] No TAKE 1 TABLET DAILY AFTER  SUPPER Corwin Levins, MD Taking Active   Glucosamine-Chondroit-Vit C-Mn (GLUCOSAMINE 1500 COMPLEX) CAPS [id number] No Take 1 capsule by mouth in the morning.  [provider] Taking Active Self  hydrochlorothiazide (MICROZIDE) 12.5 MG capsule [id number] No Take 1 capsule (12.5 mg total) by mouth daily. Thomasene Ripple, DO Taking Active Self  leflunomide (ARAVA) 20 MG tablet [id number] No Take 20 mg by mouth in the morning. [provider] Taking Active Self  loratadine-pseudoephedrine (CLARITIN-D 24-HOUR) 10-240 MG 24 hr tablet [id number] No Take 1 tablet by mouth in the morning. [provider] Taking Active Self  LORazepam (ATIVAN) 1 MG tablet [id number] No Take upon arrival to MRI appointment. Must have a driver. Tobb, Kardie, DO Taking Active   metoprolol succinate (TOPROL-XL) 100 MG 24 hr tablet [id number] No TAKE 1 TABLET DAILY WITH ORIMMEDIATELY FOLLOWING A    MEAL ( DOSE INCREASE ) Tobb, Kardie, DO Taking Active   predniSONE (DELTASONE) 5 MG tablet [id number] No TAKE 1 TABLET ONCE DAILY for 90 [provider] Taking Active   RESTASIS 0.05 % ophthalmic emulsion [id number] No Place 1 drop into both eyes 2 (two) times daily as needed (dry/irritated eyes.). [provider] Taking Active Self  temazepam (RESTORIL) 15 MG capsule [id number]  TAKE 1 CAPSULE AT BEDTIME  AS NEEDED FOR SLEEP Corwin Levins, MD  Active   verapamil (CALAN-SR) 180 MG CR tablet [id number] No Take 1 tablet (180 mg total) by mouth in the morning and at bedtime. Thomasene Ripple, DO Taking Active             Home Care and Equipment/Supplies: Were Home Health Services Ordered?: No Any new equipment or medical supplies ordered?: No  Functional Questionnaire: Do you need assistance with bathing/showering or dressing?: No Do you need assistance with meal preparation?: No Do you need assistance with  eating?: No Do you have difficulty maintaining continence: No Do you need assistance with getting out of bed/getting out of a chair/moving?: No Do you have difficulty managing or taking your medications?: No  Follow up appointments reviewed: PCP  Follow-up appointment confirmed?: No (Patient declined scheduling) MD Provider Line Number:[phone] Given: Yes Specialist Hospital Follow-up appointment confirmed?: NA Do you need transportation to your follow-up appointment?: No Do you understand care options if your condition(s) worsen?: Yes-patient verbalized understanding    SIGNATURE: Elyse Jarvis, CMA

## 2023-06-07 ENCOUNTER — Other Ambulatory Visit: Payer: Self-pay | Admitting: Cardiology

## 2023-06-23 ENCOUNTER — Other Ambulatory Visit: Payer: Self-pay | Admitting: Internal Medicine

## 2023-07-15 ENCOUNTER — Other Ambulatory Visit: Payer: Self-pay | Admitting: Internal Medicine

## 2023-07-15 ENCOUNTER — Other Ambulatory Visit: Payer: Self-pay

## 2023-09-07 ENCOUNTER — Telehealth: Payer: Self-pay | Admitting: Internal Medicine

## 2023-09-07 NOTE — Telephone Encounter (Signed)
Contacted [facility] to schedule their annual wellness visit. Patient declined to schedule AWV at this time. Transferred care to a PCP in [facility]. Patient didn't provide New PCP name.  [facility] Care Guide [facility] AWV TEAM Direct Dial: [phone]

## 2023-09-13 ENCOUNTER — Ambulatory Visit: Payer: Medicare Other | Admitting: Internal Medicine
